# Patient Record
Sex: Female | Born: 1960 | Race: White | Hispanic: No | Marital: Married | Smoking: Former smoker
Health system: Southern US, Community
[De-identification: ages and names within clinical notes are randomized; demographics above are authoritative.]

## PROBLEM LIST (undated history)

## (undated) DIAGNOSIS — Z83719 Family history of colon polyps, unspecified: Secondary | ICD-10-CM

## (undated) DIAGNOSIS — Z8371 Family history of colonic polyps: Secondary | ICD-10-CM

## (undated) DIAGNOSIS — T8859XA Other complications of anesthesia, initial encounter: Secondary | ICD-10-CM

## (undated) DIAGNOSIS — T4145XA Adverse effect of unspecified anesthetic, initial encounter: Secondary | ICD-10-CM

## (undated) DIAGNOSIS — G629 Polyneuropathy, unspecified: Secondary | ICD-10-CM

## (undated) DIAGNOSIS — K589 Irritable bowel syndrome without diarrhea: Secondary | ICD-10-CM

## (undated) DIAGNOSIS — T7840XA Allergy, unspecified, initial encounter: Secondary | ICD-10-CM

## (undated) DIAGNOSIS — F32A Depression, unspecified: Secondary | ICD-10-CM

## (undated) DIAGNOSIS — F329 Major depressive disorder, single episode, unspecified: Secondary | ICD-10-CM

## (undated) DIAGNOSIS — F419 Anxiety disorder, unspecified: Secondary | ICD-10-CM

## (undated) DIAGNOSIS — D6800 Von Willebrand disease, unspecified: Secondary | ICD-10-CM

## (undated) DIAGNOSIS — G4700 Insomnia, unspecified: Secondary | ICD-10-CM

## (undated) DIAGNOSIS — Z9889 Other specified postprocedural states: Secondary | ICD-10-CM

## (undated) DIAGNOSIS — D1803 Hemangioma of intra-abdominal structures: Secondary | ICD-10-CM

## (undated) DIAGNOSIS — J302 Other seasonal allergic rhinitis: Secondary | ICD-10-CM

## (undated) DIAGNOSIS — I7300 Raynaud's syndrome without gangrene: Secondary | ICD-10-CM

## (undated) DIAGNOSIS — G4730 Sleep apnea, unspecified: Secondary | ICD-10-CM

## (undated) DIAGNOSIS — M1990 Unspecified osteoarthritis, unspecified site: Secondary | ICD-10-CM

## (undated) DIAGNOSIS — E785 Hyperlipidemia, unspecified: Secondary | ICD-10-CM

## (undated) DIAGNOSIS — D689 Coagulation defect, unspecified: Secondary | ICD-10-CM

## (undated) DIAGNOSIS — R112 Nausea with vomiting, unspecified: Secondary | ICD-10-CM

## (undated) DIAGNOSIS — N951 Menopausal and female climacteric states: Secondary | ICD-10-CM

## (undated) DIAGNOSIS — J310 Chronic rhinitis: Secondary | ICD-10-CM

## (undated) DIAGNOSIS — G43909 Migraine, unspecified, not intractable, without status migrainosus: Secondary | ICD-10-CM

## (undated) DIAGNOSIS — I499 Cardiac arrhythmia, unspecified: Secondary | ICD-10-CM

## (undated) DIAGNOSIS — M19079 Primary osteoarthritis, unspecified ankle and foot: Secondary | ICD-10-CM

## (undated) DIAGNOSIS — M549 Dorsalgia, unspecified: Secondary | ICD-10-CM

## (undated) HISTORY — DX: Sleep apnea, unspecified: G47.30

## (undated) HISTORY — DX: Depression, unspecified: F32.A

## (undated) HISTORY — DX: Raynaud's syndrome without gangrene: I73.00

## (undated) HISTORY — DX: Hyperlipidemia, unspecified: E78.5

## (undated) HISTORY — DX: Migraine, unspecified, not intractable, without status migrainosus: G43.909

## (undated) HISTORY — DX: Anxiety disorder, unspecified: F41.9

## (undated) HISTORY — PX: OTHER SURGICAL HISTORY: SHX169

## (undated) HISTORY — DX: Coagulation defect, unspecified: D68.9

## (undated) HISTORY — DX: Von Willebrand disease, unspecified: D68.00

## (undated) HISTORY — DX: Hemangioma of intra-abdominal structures: D18.03

## (undated) HISTORY — DX: Allergy, unspecified, initial encounter: T78.40XA

## (undated) HISTORY — DX: Chronic rhinitis: J31.0

## (undated) HISTORY — DX: Family history of colon polyps, unspecified: Z83.719

## (undated) HISTORY — DX: Insomnia, unspecified: G47.00

## (undated) HISTORY — DX: Irritable bowel syndrome, unspecified: K58.9

## (undated) HISTORY — DX: Menopausal and female climacteric states: N95.1

## (undated) HISTORY — DX: Unspecified osteoarthritis, unspecified site: M19.90

## (undated) HISTORY — PX: COLONOSCOPY: SHX174

## (undated) HISTORY — DX: Von Willebrand's disease: D68.0

## (undated) HISTORY — DX: Polyneuropathy, unspecified: G62.9

## (undated) HISTORY — DX: Family history of colonic polyps: Z83.71

## (undated) HISTORY — DX: Other seasonal allergic rhinitis: J30.2

## (undated) HISTORY — PX: SEPTOPLASTY: SUR1290

## (undated) HISTORY — DX: Dorsalgia, unspecified: M54.9

---
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
## 1999-12-30 ENCOUNTER — Other Ambulatory Visit: Admission: RE | Admit: 1999-12-30 | Discharge: 1999-12-30 | Payer: Self-pay | Admitting: Gynecology

## 2000-07-13 ENCOUNTER — Other Ambulatory Visit: Admission: RE | Admit: 2000-07-13 | Discharge: 2000-07-13 | Payer: Self-pay | Admitting: Gynecology

## 2000-08-07 ENCOUNTER — Encounter (INDEPENDENT_AMBULATORY_CARE_PROVIDER_SITE_OTHER): Payer: Self-pay | Admitting: *Deleted

## 2000-08-07 ENCOUNTER — Ambulatory Visit (HOSPITAL_COMMUNITY): Admission: RE | Admit: 2000-08-07 | Discharge: 2000-08-07 | Payer: Self-pay | Admitting: Gastroenterology

## 2000-08-27 ENCOUNTER — Ambulatory Visit (HOSPITAL_COMMUNITY): Admission: RE | Admit: 2000-08-27 | Discharge: 2000-08-27 | Payer: Self-pay | Admitting: Gastroenterology

## 2000-08-27 ENCOUNTER — Encounter: Payer: Self-pay | Admitting: Gastroenterology

## 2001-02-02 ENCOUNTER — Other Ambulatory Visit: Admission: RE | Admit: 2001-02-02 | Discharge: 2001-02-02 | Payer: Self-pay | Admitting: Gynecology

## 2001-04-30 ENCOUNTER — Inpatient Hospital Stay (HOSPITAL_COMMUNITY): Admission: AD | Admit: 2001-04-30 | Discharge: 2001-04-30 | Payer: Self-pay | Admitting: *Deleted

## 2001-05-01 ENCOUNTER — Encounter: Payer: Self-pay | Admitting: *Deleted

## 2001-05-01 ENCOUNTER — Inpatient Hospital Stay (HOSPITAL_COMMUNITY): Admission: AD | Admit: 2001-05-01 | Discharge: 2001-05-01 | Payer: Self-pay | Admitting: *Deleted

## 2001-06-20 ENCOUNTER — Inpatient Hospital Stay (HOSPITAL_COMMUNITY): Admission: AD | Admit: 2001-06-20 | Discharge: 2001-06-27 | Payer: Self-pay | Admitting: Gynecology

## 2001-06-21 ENCOUNTER — Encounter: Payer: Self-pay | Admitting: Gynecology

## 2001-06-23 ENCOUNTER — Encounter: Payer: Self-pay | Admitting: Gynecology

## 2001-07-04 ENCOUNTER — Inpatient Hospital Stay (HOSPITAL_COMMUNITY): Admission: AD | Admit: 2001-07-04 | Discharge: 2001-07-07 | Payer: Self-pay | Admitting: Gynecology

## 2001-07-04 ENCOUNTER — Encounter: Payer: Self-pay | Admitting: Gynecology

## 2001-07-04 ENCOUNTER — Encounter (INDEPENDENT_AMBULATORY_CARE_PROVIDER_SITE_OTHER): Payer: Self-pay | Admitting: Specialist

## 2001-07-08 ENCOUNTER — Encounter: Admission: RE | Admit: 2001-07-08 | Discharge: 2001-08-07 | Payer: Self-pay | Admitting: Gynecology

## 2001-07-29 ENCOUNTER — Other Ambulatory Visit: Admission: RE | Admit: 2001-07-29 | Discharge: 2001-07-29 | Payer: Self-pay | Admitting: Gynecology

## 2003-06-22 ENCOUNTER — Other Ambulatory Visit: Admission: RE | Admit: 2003-06-22 | Discharge: 2003-06-22 | Payer: Self-pay | Admitting: Gynecology

## 2004-07-17 ENCOUNTER — Other Ambulatory Visit: Admission: RE | Admit: 2004-07-17 | Discharge: 2004-07-17 | Payer: Self-pay | Admitting: Gynecology

## 2005-01-03 ENCOUNTER — Encounter: Admission: RE | Admit: 2005-01-03 | Discharge: 2005-01-03 | Payer: Self-pay | Admitting: Internal Medicine

## 2005-12-01 ENCOUNTER — Ambulatory Visit (HOSPITAL_COMMUNITY): Admission: RE | Admit: 2005-12-01 | Discharge: 2005-12-01 | Payer: Self-pay | Admitting: Endocrinology

## 2005-12-02 ENCOUNTER — Other Ambulatory Visit: Admission: RE | Admit: 2005-12-02 | Discharge: 2005-12-02 | Payer: Self-pay | Admitting: Internal Medicine

## 2005-12-11 HISTORY — PX: NOVASURE ABLATION: SHX5394

## 2005-12-30 ENCOUNTER — Ambulatory Visit (HOSPITAL_COMMUNITY): Admission: RE | Admit: 2005-12-30 | Discharge: 2005-12-30 | Payer: Self-pay | Admitting: Gynecology

## 2005-12-30 ENCOUNTER — Encounter (INDEPENDENT_AMBULATORY_CARE_PROVIDER_SITE_OTHER): Payer: Self-pay | Admitting: *Deleted

## 2007-05-11 ENCOUNTER — Other Ambulatory Visit: Admission: RE | Admit: 2007-05-11 | Discharge: 2007-05-11 | Payer: Self-pay | Admitting: Gynecology

## 2008-06-13 ENCOUNTER — Other Ambulatory Visit: Admission: RE | Admit: 2008-06-13 | Discharge: 2008-06-13 | Payer: Self-pay | Admitting: Family Medicine

## 2010-05-06 ENCOUNTER — Ambulatory Visit: Payer: Self-pay | Admitting: Gynecology

## 2010-07-15 ENCOUNTER — Ambulatory Visit: Payer: Self-pay | Admitting: Gynecology

## 2010-09-09 ENCOUNTER — Ambulatory Visit: Payer: Self-pay | Admitting: Gynecology

## 2010-10-24 ENCOUNTER — Ambulatory Visit
Admission: RE | Admit: 2010-10-24 | Discharge: 2010-10-24 | Payer: Self-pay | Source: Home / Self Care | Attending: Gynecology | Admitting: Gynecology

## 2010-10-31 ENCOUNTER — Ambulatory Visit
Admission: RE | Admit: 2010-10-31 | Discharge: 2010-10-31 | Payer: Self-pay | Source: Home / Self Care | Attending: Gynecology | Admitting: Gynecology

## 2010-11-05 ENCOUNTER — Other Ambulatory Visit: Payer: Self-pay | Admitting: Gastroenterology

## 2010-11-13 ENCOUNTER — Encounter: Payer: Self-pay | Admitting: Gastroenterology

## 2011-02-28 NOTE — H&P (Signed)
[facility]  Patient:    [name], [name] Visit Number: [id number] MRN: [id number]          Service Type: OBS Location: 910D 9124 01 Attending Physician:  Tonye Royalty Dictated by:   Gaetano Hawthorne. Lily Peer, M.D. Admit Date:  06/20/2001                           History and Physical  CHIEF COMPLAINT:              1. Twin gestation at 29-6/[redacted] weeks gestation                                  (diamniotic-dichorionic).                               2. Premature contractions.                               3. History of von Willebrands disease.  HISTORY:                      Patient is a 50 year old gravida 1, para 0 with last menstrual period of November 26, 2000, estimated date of confinement September 01, 2001, currently 29-6/[redacted] weeks gestation.  Patient has been on terbutaline 5 mg p.o. q.6h., initially was p.r.n. and recently starting taking it more regular, every six hours, and complained of increased uterine activity, more than six to eight contractions in a hour, and felt some lower abdominal pressure and was instructed to come into [facility].  Right before coming to the hospital, instead of taking her terbutaline 6 hours apart, she took it at 4 hours and when she arrived, her contractions were not as frequent but she had had approximately 4 in a 20-minute period.  Her cervix is long, closed and posterior.  GBS culture and wet prep were obtained. Her prenatal history is significant for the fact that she has a history of von Willebrands disease for which the plans were to administer DDAVP before any vaginal delivery or cesarean section.  She has been at bedrest at home since [redacted] weeks gestation due to the fact that she has shortening of her cervix. Her last ultrasound two weeks ago had determined the cervical length measure was 1.8 cm and some funneling.  Due to the fact that she has advanced maternal age, she had also prenatally  undergone genetic amniocentesis with one of the twins being a female and the other twin female with normal chromosomal studies.  ALLERGIES:                    She is allergic to SULFA.  PAST MEDICAL HISTORY:         1. History of von Willebrands disease.                               2. 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  REVIEW OF SYSTEMS:  See [place] form.  PHYSICAL EXAMINATION:  GENERAL:                      Well-developed, well-nourished female.  HEENT:                        Unremarkable.  NECK:                         Supple.  Trachea midline.  No carotid bruit.  No thyromegaly.  LUNGS:                        Clear to auscultation, without rhonchi or wheezes.  HEART:                        Regular rate and rhythm.  No murmurs or gallops.  BREASTS:                      Exam not done, done during the first prenatal visit.  ABDOMEN:                      Gravid uterus.  Fetal heart tones x 2.  PELVIC:                       Cervix long, closed and posterior.  EXTREMITIES:                  DTRs 1+.  Negative clonus.  PRENATAL LABORATORY DATA:     O-positive blood type, negative antibody screen. Toxoplasmosis screen was negative.  VDRL was nonreactive.  Rubella with evidence of immunity.  Hepatitis B surface antigen and HIV were negative. Amniocentesis as noted above.  Patient with abnormal diabetes screen but a normal three-hour GTT.  ASSESSMENT:                   [age]-year-old gravida 1 para 0 at 29-6/[redacted] weeks gestation with diamniotic and dichorionic twin gestation was being treated as an outpatient with terbutaline and has appeared to have broken through the oral tocolytics and was admitted for magnesium sulfate intravenous tocolytic.  She will receive 4 g bolus followed by 2 g/hr.  Group beta streptococcus was obtained.  In the interim, patient will be placed on penicillin G 5-million units as a  loading dose, followed by 2.5-million units intravenously q.4h.  Also, dexamethasone will be administered 6 mg intramuscularly and to be repeated q.12h. for four doses to enhance fetal lung maturity in the event that she were to continue to break through with tocolytics and imminent premature delivery.  Also, due to the history of von Willebrands disease, will contact the laboratory so that DDAVP may be available, and with the pharmacy as well.  Will also plan on obtaining an ultrasound for concordant growth and presentations of the twins and routine admission labs with type and screen.  PLAN:                         As per assessment above.  All the above were discussed with the patient and husband and all questions were answered and will follow accordingly. Dictated by:   Gaetano Hawthorne Lily Peer, M.D. Attending Physician:  Tonye Royalty DD:  06/21/01 TD:  06/21/01 Job: [id number]

## 2011-02-28 NOTE — Procedures (Signed)
[place]  Patient:    [name], [name]                     MRN: [id number] Proc. Date: 08/07/00 Adm. Date:  [id number] Attending:  Charna Elizabeth CC:         Timothy P. Fontaine, M.D.   Procedure Report  DATE OF BIRTH:  December 25, 1960  REFERRING PHYSICIAN:  Nadyne Coombes. Fontaine, M.D.  PROCEDURE PERFORMED:  Colonoscopy with biopsies.  ENDOSCOPIST:  Anselmo Rod, M.D.  INSTRUMENT USED:  Olympus video colonoscope.  INDICATIONS FOR PROCEDURE:  Right lower quadrant abdominal pain with occasional blood and mucus in stool in a 50 year old white female rule out inflammatory bowel disease.  PREPROCEDURE PREPARATION:  Informed consent was procured from the patient. The patient was fasted for eight hours prior to the procedure and prepped with a bottle of magnesium citrate and a gallon of NuLytely the night prior to the procedure.  PREPROCEDURE PHYSICAL:  The patient had stable vital signs.  Neck supple. Chest clear to auscultation.  S1, S2 regular.  Abdomen soft with right lower quadrant tenderness on palpation with guarding. No rebound, no rigidity, no hepatosplenomegaly.  DESCRIPTION OF PROCEDURE:  The patient was placed in the left lateral decubitus position and sedated with 8 mg of Versed intravenously.  The patient opted not to get Demerol as she felt it made her nauseous.   The patient was maintained on low-flow oxygen and continuous cardiac monitoring.  Once she was adequately sedated, the Olympus video colonoscope was advanced from the rectum to the cecum without difficulty.  The entire colonic mucosa appeared healthy without erosions, masses, polyps, or diverticulosis. There was patchy ulceration seen in the terminal ileum that biopsied for pathology.  There were small internal hemorrhoids appreciated on retroflexion.  The patient tolerated the procedure well without complication.  IMPRESSION: 1. Small nonbleeding internal  hemorrhoids. 2. Normal-appearing colon up to cecum. 3. Patchy whitish ulcerations in terminal ileum, biopsied to rule out    inflammatory bowel disease.  RECOMMENDATIONS: 1. Await pathology results. 2. Avoid all nonsteroidals. 3. Small bowel follow-through to further evaluate the small intestines and    for inflammatory bowel disease. 4. Outpatient follow-up in the next two weeks. DD:  08/07/00 TD:  08/07/00 Job: [id number]

## 2011-02-28 NOTE — Discharge Summary (Signed)
[facility]  Patient:    [name], [name] Visit Number: [id number] MRN: [id number]          Service Type: OBS Location: 910A 9147 01 Attending Physician:  Douglass Rivers Dictated by:   Antony Contras, [facility] Admit Date:  07/04/2001 Discharge Date: 07/07/2001                             Discharge Summary  DISCHARGE DIAGNOSES:          1. Twin pregnancy, 31-5/7 weeks.                               2. Preterm labor.                               3. Failed tocolysis.  PROCEDURES:                   Primary low cervical transverse cesarean                               section with delivery of viable infants.  HISTORY OF PRESENT ILLNESS:   The patient is a 50 year old primigravida with an LMP of November 26, 2000, [facility] September 01, 2001. Pregnancy was complicated by twin gestation, diamniotic, dichorinic twins. The patient also has von Willebrands disease and does take DDAVP before surgical procedures. She is also advanced maternal age.  LABORATORIES:                 Blood type O positive, antibody screen negative, RPR, HBsAg, HIV nonreactive. Toxoplasmosis negative. Amniotic AFP normal. Amniocentesis normal.  HOSPITAL COURSE/TREATMENT:    The patient was admitted on July 04, 2001 with a history of light spotting. The patient was on the terbutaline pump at home and she had noticed some contractions. Cervix was a fingertip, 80%, -4 station. Patient was bolused on terbutaline with good results and then treated with magnesium sulfate. The cervix progressed to a pinpoint with a bulging lower uterine segment. At this point, it was decided she had failed tocolysis so she was taken to the OR where low cervical transverse cesarean section was performed by Dr. Farrel Gobble under epidural/spinal anesthesia. The patient delivered twin infants, female infant delivered footling breech, clear amniotic fluid. Birth weight was 3-13, Apgars 6 and 8, and female  infant, transverse lie, back up, vertex, clear amniotic fluid, Apgars 8 and 9, and birth weight was 3.8.  POSTPARTUM COURSE:            The patient remained afebrile and had no difficulty voiding and was able to be discharged on her third postoperative day.  CBC: Hematocrit 29.5, hemoglobin 10.2, WBCs 12.5, platelets 229,000.  DISPOSITION:                  The patient is to follow up in six weeks, continue prenatal vitamins and iron, and Motrin and Tylox for pain. Dictated by:   Antony Contras, [facility] Attending Physician:  Douglass Rivers DD:  07/30/01 TD:  08/03/01 Job: [id number]

## 2011-02-28 NOTE — H&P (Signed)
[name], [name]            ACCOUNT NO.:  [id number]   MEDICAL RECORD NO.:  [id number]          PATIENT TYPE:  AMB   LOCATION:  SDC                           FACILITY:  [facility]   PHYSICIAN:  Timothy P. Fontaine, M.D.DATE OF BIRTH:  12/28/1960   DATE OF ADMISSION:  12/30/2005  DATE OF DISCHARGE:                                HISTORY & PHYSICAL   CHIEF COMPLAINT:  Menorrhagia.   HISTORY OF PRESENT ILLNESS:  A 50 year old G1, P2 female with history of  increasing menorrhagia.  The patient does have a history of mild von  Willebrand's, for which she is on no active therapy, but does use DDAVT for  bleeding episodes.  Her menorrhagia evaluation includes a history of a  relatively normal hemoglobin, normal thyroid functions, ultrasound which is  normal, sonohystogram which is also negative.  The patient is having  heavier, more frequent menses, which are becoming socially unacceptable, and  she is admitted at this time for NovaSure endometrial ablation, D&C,  hysteroscopy.   PAST MEDICAL HISTORY:  1.  von Willebrand's.  2.  Anxiety.  3.  Irritable bowel syndrome.   CURRENT MEDICATIONS:  1.  Cymbalta.  2.  Zelnorm.   ALLERGIES:  SULFA.   PAST SURGICAL HISTORY:  1.  Cesarean section.  2.  Left knee arthroscopy.  3.  Laparoscopic ovarian cystectomy.  4.  Pilonidal cyst.  5.  Septoplasty.   REVIEW OF SYSTEMS:  Noncontributory.   FAMILY HISTORY:  Noncontributory.   SOCIAL HISTORY:  Noncontributory.   PHYSICAL EXAMINATION:  VITAL SIGNS:  Afebrile.  Vital signs are stable.  HEENT:  Normal.  LUNGS:  Clear.  CARDIAC:  Regular rate without rubs, murmurs, or gallops.  ABDOMEN:  Benign.  PELVIC:  External BUS, vagina normal.  Cervix normal.  Uterus normal size,  midline and mobile.  Adnexa without masses or tenderness.   ASSESSMENT:  A 50 year old with increasing menorrhagia, socially  unacceptable.  Thyroid normal.  Sonohystogram negative.  Vasectomy birth  control.  For  NovaSure endometrial ablation.  I reviewed with the patient  the procedure, the expected intraoperative, postoperative courses.  I  reviewed the instrumentation.  She understands it is machine dependent, and  if there is a malfunction in the machine that we may have to cancel the  ablation.  There are no guarantees as far as bleeding relief.  She  understands that her bleeding may persist, worsen or change following the  procedure, all of which she understands and accepts.  She understands that  she should never achieve pregnancy following the procedure.  She is using  her husband's vasectomy, but she must always use assured birth control.  The  acute risks of the procedure were reviewed with the patient to include the  risks of infection requiring prolonged antibiotics, the risks of hemorrhage  necessitating transfusion, and the risks of transfusion.  The risk of  uterine perforation, inadvertant injury to internal organs including bowel,  bladder, ureters, vessels and nerves necessitating major reparative  surgeries and future reparative surgeries including ostomy formation was all  discussed, understood and accepted.  The patient understands  that she should  continue with gynecologic care following the procedure, and should be  continued to be screened for cervical endometrial disease in the future.  The patient's questions were answered to her satisfaction, and she is ready  to proceed with surgery.      Timothy P. Fontaine, M.D.  Electronically Signed     TPF/MEDQ  D:  12/25/2005  T:  12/25/2005  Job:  [id number]

## 2011-02-28 NOTE — Op Note (Signed)
[name], [name]            ACCOUNT NO.:  [id number]   MEDICAL RECORD NO.:  [id number]          PATIENT TYPE:  AMB   LOCATION:  SDC                           FACILITY:  [facility]   PHYSICIAN:  Timothy P. Fontaine, M.D.DATE OF BIRTH:  04/01/1961   DATE OF PROCEDURE:  12/30/2005  DATE OF DISCHARGE:                                 OPERATIVE REPORT   PREOPERATIVE DIAGNOSES:  Menorrhagia.   POSTOPERATIVE DIAGNOSES:  Menorrhagia.   PROCEDURE:  Hysteroscopy D&C, NovaSure endometrial ablation.   SURGEON:  Dr. Audie Box.   ANESTHETIC:  MAC with 1% lidocaine paracervical block.   ESTIMATED BLOOD LOSS:  Minimal.   SORBITOL DISCREPANCY:  85 mL.   COMPLICATIONS:  None.   SPECIMEN:  Endometrial curetting.   FINDINGS:  EUA, external BUS and vagina normal. Cervix is normal. Uterus  normal size, midline and mobile, nontender. Adnexa without masses or  tenderness. Hysteroscopic post ablation shows a good even treatment with  cornual coverage. Cavity is normal with good distension, no evidence of  perforation.   PROCEDURE:  The patient was taken to the operating room, underwent IV  sedation and was placed low dorsal lithotomy position, received perineal  vaginal preparation with Betadine solution. Bladder emptied with in-and-out  Foley catheterization in sterile technique and the EUA performed. The  patient was draped in usual fashion. Cervix visualized with a speculum.  Anterior lip grasped with single-tooth tenaculum and a paracervical block  using 1% lidocaine was placed. A total of 10 mL. Cervix was gently gradually  dilated to admit the smallest sharp curette and a gentle sharp curettage was  performed with scant return. Subsequently the endocervical length and  uterine length were calculated with a sound of 8 cm and a cervical length of  3 cm. The NovaSure ablator was placed within the cavity, according to  manufacturer's recommendations and manipulation assured proper placement.  The uterine cavity width was calculated at 3. Subsequently the carbon  dioxide testing sleeve was advanced and the carbon dioxide test performed  and passed. Subsequently the endometrial ablation was performed without  difficulty. The instrument was removed. Hysteroscopy was performed with a  normal-appearing cavity and an even post ablation  change. The instruments were all removed. Tenaculum sites inspected. Silver  nitrate applied to one site to achieve ultimate hemostasis. Subsequently  speculum removed. The patient placed in supine position and was taken to  recovery room in good condition having tolerated procedure well.      Timothy P. Fontaine, M.D.  Electronically Signed     TPF/MEDQ  D:  12/30/2005  T:  12/30/2005  Job:  [id number]

## 2011-02-28 NOTE — H&P (Signed)
NAMEHANNAHMARIE, [name]            ACCOUNT NO.:  [id number]   MEDICAL RECORD NO.:  [id number]          PATIENT TYPE:  AMB   LOCATION:  SDC                           [facility]ILITY:  [facility]   PHYSICIAN:  Timothy P. Fontaine, M.D.DATE OF BIRTH:  03/24/1961   DATE OF ADMISSION:  10/09/2005  DATE OF DISCHARGE:                                HISTORY & PHYSICAL   CHIEF COMPLAINT:  Menorrhagia.   HISTORY OF PRESENT ILLNESS:  A 50 year old G1, P2 female, vasectomy birth  control, with a history of increasing menorrhagia for endometrial ablation.  Outpatient evaluation includes a normal thyroid and a negative sonohystogram  for endometrial abnormalities. The patient does have a generous hemorrhagic-  appearing cyst on the right ovary measuring 46 mm, and the options and  issues with this were reviewed to include postponing the ablation for follow-  up ultrasound of the ovary, rule out possibility of laparoscopy in the  future or proceeding with laparoscopic evaluation now, and the patient  elects for conservative management with re-ultrasound in several months for  reevaluation. She wants to proceed with the ablation at this time.   PAST MEDICAL HISTORY:  von willebrands disease.   PAST SURGICAL HISTORY:  1.  Cesarean section.  2.  Laparoscopy with right ovarian cystectomy.  3.  Pilonidal cystectomy.  4.  Left knee arthroscopy.   CURRENT MEDICATIONS:  None.   ALLERGIES:  SULFA.   REVIEW OF SYSTEMS:  Noncontributory.   FAMILY HISTORY:  Noncontributory.   SOCIAL HISTORY:  Noncontributory.   PHYSICAL EXAMINATION:  VITAL SIGNS:  Afebrile. Vital signs stable.  HEENT: Normal.  LUNGS:  Clear.  CARDIAC:  Regular rate without rubs, murmurs or gallops.  ABDOMEN:  Benign.  PELVIC:  External BUS, vagina normal. Cervix grossly normal. Uterus normal  size, midline, mobile. Left adnexa normal. Right adnexa with mildly enlarged  palpable ovary.   ASSESSMENT/PLAN:  A 50 year old G1, P2 female,  vasectomy birth control, with  increasing menorrhagia.  Negative sonohystogram for endometrial  abnormalities. Normal thyroid in the past.  History of mild von  willebrands, for endometrial ablation. I reviewed with the patient what is  involved with endometrial ablation to include the expected intraoperative,  postoperative courses, and the technology involved with NovaSure. She  understands there are no guarantees as far as bleeding relief and she may  continue to have periods afterwards, and that her periods may be heavy  longer or worsen. She also understands that if there is a malfunction with  the equipment that we may not be able to achieve the ablation at this time.  She understands that she should never achieve a pregnancy following the  procedure.  They are using vasectomy for birth control, but she understands  if she has a different partner in the future that she should never achieve a  pregnancy, that this could be a very dangerous situation. I reviewed the  risks of bleeding, transfusion, infection, uterine perforation, damage to  internal organs including bowel, bladder, ureters, vessels and nerves  necessitating major exploratory reparative surgeries and future reparative  surgeries, all of which were understood and accepted.  The continued need for  gynecologic follow-up following the procedure assuming amenorrhea was  discussed both from a Pap smear, as well as endometrial evaluation  surveillance were reviewed, and the patient understands this. The patient's  questions were answered to her satisfaction. She is ready to proceed with  surgery.      Timothy P. Fontaine, M.D.  Electronically Signed     TPF/MEDQ  D:  09/22/2005  T:  09/22/2005  Job:  [id number]

## 2011-02-28 NOTE — Op Note (Signed)
[facility]  Patient:    [name], [name] Visit Number: [id number] MRN: [id number]          Service Type: OBS Location: 910D 9124 01 Attending Physician:  Douglass Rivers Proc. Date: 07/04/01 Admit Date:  07/04/2001                             Operative Report  PREOPERATIVE DIAGNOSES:         1. Twins at 31-5/7 weeks.                                 2. Preterm labor.                                 3. Failed tocolysis.  POSTOPERATIVE DIAGNOSES:        1. Twins at 31-5/7 weeks.                                 2. Preterm labor.                                 3. Failed tocolysis.  OPERATION:                      Primary cesarean section low flap transverse.  SURGEON:                        Douglass Rivers, M.D.  ANESTHESIA:                     Epidural spinal.  ESTIMATED BLOOD LOSS:           600 cc.  FINDINGS:                       Viable female infant delivered footling breech, clear amniotic fluid.  Birth weight 313, Apgars 6 and 8.  Viable female second twin.  Transverse lie back up.  Delivered vertex.  Clear amniotic fluid. Apgars were 8 and 9.  Birth weight was 3.8.  PATHOLOGY:                      Placentas.  COMPLICATIONS:                  The patient was taken to the operating room and placed in the supine position left lateral displacement after anesthesia was induced.  After adequate anesthesia was ensured, a Pfannenstiel skin incision was made with the scalpel and carried to the underlying layer of fascia with electrocautery.  The fascia was scored in the midline.  The incision was extended laterally with Mayo scissors.  The inferior aspect of the fascial incision was grasped with Kochers.  The underlying rectus muscles were dissected off by blunt and sharp dissection.  In a similar fashion, the superior aspect of the incision was grasped with Kochers and the underlying rectus muscles were separated off.  The rectus muscles were  separated in the midline.  The peritoneum was identified and entered sharply.  The peritoneal incision was then extended superiorly and inferiorly with good visualization of underlying bowel and  bladder.  The orientation of the uterus was confirmed. The vesicouterine peritoneum was identified.  The bladder blade was inserted, tented up and entered sharply with the Metzenbaum scissors.  The incision was extended laterally.  The bladder flap was created digitally.  The bladder blade was then reinserted in the lower uterine segment and incised in a transverse fashion with the scalpel.  Clear amniotic fluid was noted upon entering the cavity.  The infant was delivered from the footling breech presentation with the usual maneuvers.  The cord was clamped and cut and handed off to the awaiting pediatricians.  The sac on the second twin was visualized.  Her orientation was confirmed.  Her head was gently brought through to the incision and again amniotomy was performed.  Again, clear amniotic fluid was noted.  She was then delivered from the vertex presentation.  Cord was cut and clamped and again handed off to the awaiting pediatricians.  Spontaneous respirations were noted from both twins.  Cord bloods were obtained from each respective umbilical cord.  The placentas were allowed to separate naturally.  The uterus was then cleared of all clots and debris.  The uterine incision was then repaired with a running locked layer of 0 chromic and noted to be hemostatic.  The pelvis was irrigated with a copious amount of warm saline.  The adnexa were inspected and noted to be unremarkable.  The inspection of the peritoneal muscles and fascia was also unremarkable.  The fascia was then closed with 0 Vicryl.  The subcu was irrigated and the skin was closed with staples.  The patient tolerated the procedure well.  Sponge, lap and needle counts were correct x 2.  She was given cefotetan prior to surgery  secondary to the preterm labor status and was transferred to the PACU in stable condition. Attending Physician:  Douglass Rivers DD:  07/04/01 TD:  07/04/01 Job: [id number]

## 2011-02-28 NOTE — Discharge Summary (Signed)
[facility]  Patient:    [name], [name] Visit Number: [id number] MRN: [id number]          Service Type: OBS Location: [id number] Attending Physician:  Douglass Rivers Dictated by:   Antony Contras, [facility] Admit Date:  07/04/2001 Discharge Date: 07/07/2001                             Discharge Summary  DISCHARGE DIAGNOSES:          1. Intrauterine pregnancy at 29+ weeks.                               2. Premature contractions.                               3. History of von Willebrands disease.  PROCEDURES:                   Tocolysis.  HISTORY OF PRESENT ILLNESS:   The patient is a 50 year old primigravida with an LMP of November 26, 2000, [facility] September 01, 2001. Prenatal risk factors include a history of von Willebrands disease, twin pregnancy, advanced maternal age.  LABORATORY DATA:              Blood type O positive, antibody screen negative, RPR, HBsAg, HIV nonreactive. Toxoplasmosis negative. Amniotic AFP and amniocentesis normal.  HOSPITAL COURSE/TREATMENT:    The patient was admitted on June 21, 2001 due to increased uterine activity. She had been at bed rest at home since [redacted] weeks gestation due to a shortened cervix. Ultrasound two weeks prior to admission determined cervical length was 1.8 with some funneling. She had been taking oral terbutaline 5 mg p.o. q.6h. at home. She was admitted for magnesium sulfate therapy. During the course of her hospital admission, she was able to be stabilized on magnesium sulfate. Procardia was tried but did not seem to control the preterm symptoms. She was eventually, on September 12, weaned from the magnesium to a terbutaline pump and then was able to be totally weaned from the magnesium the next day and was able to be discharged in satisfactory condition to home on June 27, 2001.  DISPOSITION:                  The patient was to continue on bed rest and to follow up in the office on  June 30, 2001. Dictated by:   Antony Contras, [facility] Attending Physician:  Douglass Rivers DD:  07/30/01 TD:  08/03/01 Job: [id number]

## 2011-05-07 ENCOUNTER — Other Ambulatory Visit: Payer: Self-pay | Admitting: *Deleted

## 2011-05-07 DIAGNOSIS — Z309 Encounter for contraceptive management, unspecified: Secondary | ICD-10-CM

## 2011-05-07 MED ORDER — NORGESTIMATE-ETH ESTRADIOL 0.25-35 MG-MCG PO TABS: 1.0000 | ORAL_TABLET | Freq: Every day | ORAL | Status: DC

## 2011-05-07 NOTE — Telephone Encounter (Signed)
PT INFORMED OF BCP REFILL, SENT TO [facility].

## 2011-05-16 ENCOUNTER — Encounter: Payer: Self-pay | Admitting: *Deleted

## 2011-05-20 ENCOUNTER — Ambulatory Visit (INDEPENDENT_AMBULATORY_CARE_PROVIDER_SITE_OTHER): Payer: BC Managed Care – PPO | Admitting: Gynecology

## 2011-05-20 ENCOUNTER — Other Ambulatory Visit (HOSPITAL_COMMUNITY)
Admission: RE | Admit: 2011-05-20 | Discharge: 2011-05-20 | Disposition: A | Discharge status: Home / Self Care | Payer: BC Managed Care – PPO | Source: Ambulatory Visit | Attending: Gynecology | Admitting: Gynecology

## 2011-05-20 ENCOUNTER — Encounter: Payer: Self-pay | Admitting: *Deleted

## 2011-05-20 ENCOUNTER — Encounter: Payer: Self-pay | Admitting: Gynecology

## 2011-05-20 VITALS — BP 130/70 | Ht 68.0 in | Wt 170.0 lb

## 2011-05-20 DIAGNOSIS — G4700 Insomnia, unspecified: Secondary | ICD-10-CM

## 2011-05-20 DIAGNOSIS — Z01419 Encounter for gynecological examination (general) (routine) without abnormal findings: Secondary | ICD-10-CM

## 2011-05-20 DIAGNOSIS — Z309 Encounter for contraceptive management, unspecified: Secondary | ICD-10-CM

## 2011-05-20 DIAGNOSIS — Z131 Encounter for screening for diabetes mellitus: Secondary | ICD-10-CM

## 2011-05-20 DIAGNOSIS — Z13220 Encounter for screening for lipoid disorders: Secondary | ICD-10-CM

## 2011-05-20 MED ORDER — VENLAFAXINE HCL ER 75 MG PO CP24: 75.0000 mg | ORAL_CAPSULE | Freq: Every day | ORAL | Status: DC

## 2011-05-20 MED ORDER — NORGESTIMATE-ETH ESTRADIOL 0.25-35 MG-MCG PO TABS: 1.0000 | ORAL_TABLET | Freq: Every day | ORAL | Status: DC

## 2011-05-20 MED ORDER — ZOLPIDEM TARTRATE 10 MG PO TABS: 10.0000 mg | ORAL_TABLET | Freq: Every evening | ORAL | Status: DC | PRN

## 2011-05-20 NOTE — Progress Notes (Signed)
RX FOR AMBIEN 10MG  TAB #60  5 REFILLS CALL IN TO [facility] .

## 2011-05-20 NOTE — Progress Notes (Signed)
Addended by: Richardson Chiquito on: 05/20/2011 04:04 PM   Modules accepted: Orders

## 2011-05-20 NOTE — Progress Notes (Signed)
[name] 03/28/1961 [id number]        50 y.o.  for annual exam.  History of dysfunctional bleeding status post NovaSure endometrial ablation doing well on every 3 months oral contraceptive withdrawal with regular light menses. History of anxiety on Effexor her and doing very well with this. She does use Ambien for sleep at night due to chronic insomnia.  Past medical history,surgical history, allergies, family history and social history were all reviewed and documented in the EPIC chart. ROS:  Was performed and pertinent positives and negatives are included in the history.  Exam: chaperone present Filed Vitals:   05/20/11 1045  BP: 130/70   General appearance  Normal Skin grossly normal Head/Neck normal with no cervical or supraclavicular adenopathy thyroid normal Lungs  clear Cardiac RR, without RMG Abdominal  soft, nontender, without masses, organomegaly or hernia Breasts  examined lying and sitting without masses, retractions, discharge or axillary adenopathy. Pelvic  Ext/BUS/vagina  normal    Cervix  normal  Pap done  Uterus  anteverted, normal size, shape and contour, midline and mobile nontender   Adnexa  Without masses or tenderness    Anus and perineum  normal   Rectovaginal  normal sphincter tone without palpated masses or tenderness.     Assessment/Plan:  50 y.o. female for annual exam.   #1 History of DU B. On BCPs with 3 months which are all doing well. We again reviewed the risks to include stroke heart attack DVT. She does not smoke she is active is not being followed for any vascular disease. She wants to continue at present I think is reasonable. I discussed at some point we'll start checking FSH during pill free week or just stop the pills altogether and see what her pattern is at that point. But at this point will be to continue and I refilled her pills x1 year.  #2 History of anxiety. Doing well on Effexor when I refilled her Effexor XR 75 mg times  year.  #3 History of insomnia. We discussed this and she is doing well on Ambien wants to continue and I refilled her times a year.  #4 Health maintenance. Self breast exams on a monthly basis discussed urged at [facility] February which was normal we'll continue with annual mammography. I ordered a baseline CBC glucose lipid profile and urinalysis.    Dara Lords MD, 11:23 AM 05/20/2011

## 2011-05-21 ENCOUNTER — Telehealth: Payer: Self-pay | Admitting: *Deleted

## 2011-05-21 NOTE — Telephone Encounter (Signed)
PT INFORMED WITH THE BELOW 

## 2011-05-21 NOTE — Telephone Encounter (Signed)
Message copied by Aura Camps on Wed May 21, 2011 12:53 PM ------      Message from: Dara Lords      Created: Tue May 20, 2011  4:06 PM       Give patient lipid profile results. Cholesterol mildly elevated although her HDL is great. Her triglycerides are mildly elevated which is not unusual in nonfasting specimen.

## 2011-06-13 ENCOUNTER — Telehealth: Payer: Self-pay | Admitting: *Deleted

## 2011-06-13 DIAGNOSIS — G4700 Insomnia, unspecified: Secondary | ICD-10-CM

## 2011-06-13 MED ORDER — ZOLPIDEM TARTRATE 10 MG PO TABS: 10.0000 mg | ORAL_TABLET | Freq: Every evening | ORAL | Status: DC | PRN

## 2011-06-13 NOTE — Telephone Encounter (Signed)
rx called in to [facility] outpatient pharmacy per request.

## 2011-06-13 NOTE — Telephone Encounter (Signed)
PHARMACY FAXED OVER A REQUEST FROM PATIENT WANTING 90 DAY SUPPLY FOR AMBIEN 10MG . PT LAST OFFICE VISIT ON 05/20/11 AND #60 WAS GIVEN. PLEASE ADVISE.

## 2011-06-13 NOTE — Telephone Encounter (Signed)
OK 

## 2011-11-07 ENCOUNTER — Ambulatory Visit (INDEPENDENT_AMBULATORY_CARE_PROVIDER_SITE_OTHER): Payer: BC Managed Care – PPO | Admitting: Gynecology

## 2011-11-07 ENCOUNTER — Encounter: Payer: Self-pay | Admitting: Gynecology

## 2011-11-07 ENCOUNTER — Ambulatory Visit: Payer: BC Managed Care – PPO | Admitting: Gynecology

## 2011-11-07 DIAGNOSIS — N938 Other specified abnormal uterine and vaginal bleeding: Secondary | ICD-10-CM

## 2011-11-07 DIAGNOSIS — N949 Unspecified condition associated with female genital organs and menstrual cycle: Secondary | ICD-10-CM

## 2011-11-07 NOTE — Progress Notes (Signed)
Patient presents on low-dose oral contraceptives doing an every three-month withdrawal with light menses reports this past cycle she bled 2 weeks in a row. She also is very emotional with mood swings which is unusual. She is on Effexor which had been working well for her. No pain or other symptoms.  Exam with Selena Batten chaperone present Abdomen soft nontender without masses guarding rebound  Pelvic external BUS vagina normal. Cervix normal. Uterus normal size midline mobile nontender. Adnexa without masses or tenderness.  Assessment and plan: DUB x1 cycle on low-dose oral contraceptives. She is not bleeding now. Continue with low-dose pills and if recurrent irregular bleeding will represent. She had an ultrasound last year which was normal. I discussed the risks again of low-dose pills to include stroke heart attack DVT. We discussed checking an FSH at some point during her pill free week and we'll do this sometime in the coming year.  Options for her emotional swings were reviewed. She is on Effexor and I offered Xanax when necessary but she declined and will monitor present.

## 2011-11-07 NOTE — Patient Instructions (Signed)
Keep menstrual calendar. Call if irregular bleeding continues or emotional swings persist.

## 2011-12-09 ENCOUNTER — Other Ambulatory Visit: Payer: Self-pay

## 2011-12-22 ENCOUNTER — Other Ambulatory Visit: Payer: Self-pay | Admitting: Gynecology

## 2011-12-22 NOTE — Telephone Encounter (Signed)
Pt calling requesting 90 day supply for her ambein 10 mg. Please advise

## 2011-12-23 NOTE — Telephone Encounter (Signed)
rx called in to [facility] pharmacy.

## 2012-02-26 ENCOUNTER — Other Ambulatory Visit: Payer: Self-pay | Admitting: Internal Medicine

## 2012-02-26 ENCOUNTER — Other Ambulatory Visit (HOSPITAL_COMMUNITY): Payer: Self-pay | Admitting: Specialist

## 2012-02-26 DIAGNOSIS — M542 Cervicalgia: Secondary | ICD-10-CM

## 2012-03-01 ENCOUNTER — Inpatient Hospital Stay (HOSPITAL_COMMUNITY)
Admission: RE | Admit: 2012-03-01 | Discharge: 2012-03-01 | Payer: BC Managed Care – PPO | Source: Ambulatory Visit | Attending: Specialist | Admitting: Specialist

## 2012-04-19 ENCOUNTER — Other Ambulatory Visit: Payer: Self-pay | Admitting: Gynecology

## 2012-05-27 ENCOUNTER — Encounter: Payer: Self-pay | Admitting: Gynecology

## 2012-05-27 ENCOUNTER — Ambulatory Visit (INDEPENDENT_AMBULATORY_CARE_PROVIDER_SITE_OTHER): Payer: BC Managed Care – PPO | Admitting: Gynecology

## 2012-05-27 VITALS — BP 124/70 | Ht 68.0 in | Wt 190.0 lb

## 2012-05-27 DIAGNOSIS — M542 Cervicalgia: Secondary | ICD-10-CM

## 2012-05-27 DIAGNOSIS — Z01419 Encounter for gynecological examination (general) (routine) without abnormal findings: Secondary | ICD-10-CM

## 2012-05-27 DIAGNOSIS — Z13220 Encounter for screening for lipoid disorders: Secondary | ICD-10-CM

## 2012-05-27 DIAGNOSIS — Z131 Encounter for screening for diabetes mellitus: Secondary | ICD-10-CM

## 2012-05-27 DIAGNOSIS — N951 Menopausal and female climacteric states: Secondary | ICD-10-CM

## 2012-05-27 LAB — CBC WITH DIFFERENTIAL/PLATELET
Basophils Absolute: 0 10*3/uL (ref 0.0–0.1)
Basophils Relative: 1 % (ref 0–1)
Eosinophils Absolute: 0.3 10*3/uL (ref 0.0–0.7)
Eosinophils Relative: 5 % (ref 0–5)
HCT: 42.1 % (ref 36.0–46.0)
Hemoglobin: 14.3 g/dL (ref 12.0–15.0)
Lymphocytes Relative: 32 % (ref 12–46)
Lymphs Abs: 2.1 10*3/uL (ref 0.7–4.0)
MCH: 31 pg (ref 26.0–34.0)
MCHC: 34 g/dL (ref 30.0–36.0)
MCV: 91.1 fL (ref 78.0–100.0)
Monocytes Absolute: 0.6 10*3/uL (ref 0.1–1.0)
Monocytes Relative: 9 % (ref 3–12)
Neutro Abs: 3.6 10*3/uL (ref 1.7–7.7)
Neutrophils Relative %: 53 % (ref 43–77)
Platelets: 251 10*3/uL (ref 150–400)
RBC: 4.62 MIL/uL (ref 3.87–5.11)
RDW: 13.1 % (ref 11.5–15.5)
WBC: 6.6 10*3/uL (ref 4.0–10.5)

## 2012-05-27 LAB — LIPID PANEL
Cholesterol: 223 mg/dL — ABNORMAL HIGH (ref 0–200)
HDL: 71 mg/dL (ref >39)
LDL Cholesterol: 125 mg/dL — ABNORMAL HIGH (ref 0–99)
Total CHOL/HDL Ratio: 3.1 Ratio
Triglycerides: 133 mg/dL (ref <150)
VLDL: 27 mg/dL (ref 0–40)

## 2012-05-27 LAB — GLUCOSE, RANDOM: Glucose, Bld: 88 mg/dL (ref 70–99)

## 2012-05-27 LAB — FOLLICLE STIMULATING HORMONE: FSH: 56.4 m[IU]/mL

## 2012-05-27 MED ORDER — NORGESTIMATE-ETH ESTRADIOL 0.25-35 MG-MCG PO TABS: 1.0000 | ORAL_TABLET | Freq: Every day | ORAL | Status: DC

## 2012-05-27 MED ORDER — ZOLPIDEM TARTRATE 10 MG PO TABS: 10.0000 mg | ORAL_TABLET | Freq: Every evening | ORAL | Status: DC | PRN

## 2012-05-27 MED ORDER — CYCLOBENZAPRINE HCL 10 MG PO TABS: 10.0000 mg | ORAL_TABLET | Freq: Three times a day (TID) | ORAL | Status: AC | PRN

## 2012-05-27 MED ORDER — VENLAFAXINE HCL ER 75 MG PO CP24: 75.0000 mg | ORAL_CAPSULE | Freq: Every day | ORAL | Status: DC

## 2012-05-27 NOTE — Patient Instructions (Signed)
Office will contact you with lab work. Follow up in one year for annual gynecologic exam.

## 2012-05-27 NOTE — Progress Notes (Addendum)
[name] [birth year] [id number]        51 y.o.  G1P1 for annual exam.  Several issues noted below.  Past medical history,surgical history, medications, allergies, family history and social history were all reviewed and documented in the EPIC chart. ROS:  Was performed and pertinent positives and negatives are included in the history.  Exam: Sherrilyn Rist assistant Filed Vitals:   05/27/12 0901  BP: 124/70  Height: 5\' 8"  (1.727 m)  Weight: 190 lb (86.183 kg)   General appearance  Normal Skin grossly normal Head/Neck normal with no cervical or supraclavicular adenopathy thyroid normal. No evidence of cervical deformity or overt muscle spasm Lungs  clear Cardiac RR, without RMG Abdominal  soft, nontender, without masses, organomegaly or hernia Breasts  examined lying and sitting without masses, retractions, discharge or axillary adenopathy. Pelvic  Ext/BUS/vagina  normal   Cervix  normal   Uterus  anteverted, normal size, shape and contour, midline and mobile nontender   Adnexa  Without masses or tenderness    Anus and perineum  normal   Rectovaginal  normal sphincter tone without palpated masses or tenderness.    Assessment/Plan:  51 y.o. G1P1 female for annual exam, vasectomy birth control.   1. DUB. Patient status post ablation with history of some erratic spotting. She was placed on every three-month oral contraceptive withdrawal and is doing great with this. She is pill free now we'll check an FSH. Otherwise tentatively she will continue on her low-dose oral contraceptives this coming year and I refilled her times a year.  We've discussed the risks of pills to include stroke heart attack DVT which she understands and accepts. 2. Anxiety. Patient is on Effexor 75 mg XR. Doing well with this wants to continue and I refilled her times a year. 3. Insomnia. Patient has a history of chronic insomnia and is using Ambien. I refilled her 10 mg #60, 3 refills. 4. Neck muscle spasm. Patient  awoke with spasm in her right neck/trapezius area. Exam is negative. We'll treat with Flexeril 10 mg at bedtime #30 when necessary. If continues will refer to orthopedics. 5. Mammography. Patient is due now and knows to schedule. SBE monthly reviewed. 6. Pap smear. Pap 2012 normal. No Pap done today. No history of abnormal Pap smears in the past. We'll plan every 3-5 year screening per current screening guidelines. 7. Health maintenance. Baseline CBC lipid profile glucose urinalysis ordered along with her FSH. Follow up in one year, sooner as needed.    Dara Lords MD, 9:51 AM 05/27/2012

## 2012-05-28 LAB — URINALYSIS W MICROSCOPIC + REFLEX CULTURE
Bacteria, UA: NONE SEEN
Bilirubin Urine: NEGATIVE
Casts: NONE SEEN
Crystals: NONE SEEN
Glucose, UA: NEGATIVE mg/dL
Hgb urine dipstick: NEGATIVE
Ketones, ur: NEGATIVE mg/dL
Leukocytes, UA: NEGATIVE
Nitrite: NEGATIVE
Protein, ur: NEGATIVE mg/dL
Specific Gravity, Urine: 1.013 (ref 1.005–1.030)
Squamous Epithelial / LPF: NONE SEEN
Urobilinogen, UA: 0.2 mg/dL (ref 0.0–1.0)
pH: 6 (ref 5.0–8.0)

## 2012-06-21 ENCOUNTER — Encounter: Payer: Self-pay | Admitting: Gynecology

## 2012-11-23 ENCOUNTER — Ambulatory Visit (INDEPENDENT_AMBULATORY_CARE_PROVIDER_SITE_OTHER): Payer: BC Managed Care – PPO | Admitting: Gynecology

## 2012-11-23 ENCOUNTER — Encounter: Payer: Self-pay | Admitting: Gynecology

## 2012-11-23 DIAGNOSIS — N951 Menopausal and female climacteric states: Secondary | ICD-10-CM

## 2012-11-23 NOTE — Patient Instructions (Signed)
Start on the [employer] as we discussed. Call me if they have any issues. Follow up when due for your annual exam.

## 2012-11-23 NOTE — Progress Notes (Signed)
Patient presents to discuss her menopausal symptoms. She was on [employer] birth control pills for bleeding regulation and menopausal symptom control. Her pharmacy discontinue these and she stopped her pills last month has had unacceptable hot flushes sweats mood swings. We discussed options to include initiating HRT now noting her FSH was elevated last year. Or continuing on a low-dose oral contraceptive. I think given her good bleeding control and symptom relief on the low-dose oral contraceptives we'll continue these for the next 6 months until she is due for her annual and then consider switching her over to a low-dose HRT such as Activella or FemHRT. Patient agrees with the plan. I gave her 6 month sample of Generess BCPs we had in the office which is a 25 mcg pill.

## 2012-12-20 ENCOUNTER — Other Ambulatory Visit: Payer: Self-pay | Admitting: Gynecology

## 2012-12-21 NOTE — Telephone Encounter (Signed)
Called into pharmacy

## 2013-02-10 ENCOUNTER — Telehealth: Payer: Self-pay | Admitting: *Deleted

## 2013-02-10 NOTE — Telephone Encounter (Signed)
rx called into pharmacy

## 2013-02-10 NOTE — Telephone Encounter (Signed)
Okay to refill? 

## 2013-02-10 NOTE — Telephone Encounter (Signed)
Delice Bison from [facility] pharmacy called and pt is requesting a refill on Ambien 10 mg tablet, this is 3 days earlier then rx can be filled without your consent. Okay to fill? Annual due in august. Please advise

## 2013-03-29 ENCOUNTER — Telehealth: Payer: Self-pay | Admitting: *Deleted

## 2013-03-29 MED ORDER — NORETHIN-ETH ESTRADIOL-FE 0.8-25 MG-MCG PO CHEW: 1.0000 | CHEWABLE_TABLET | Freq: Every day | ORAL | Status: DC

## 2013-03-29 NOTE — Telephone Encounter (Signed)
Pt has been doing well on samples given on 11/23/12 with Generess, pt is requesting Rx. Pt lost 1 of her packs of the samples, her annual is due in August 2014. I will sent 2 pack enough for her to schedule thru August.

## 2013-04-18 ENCOUNTER — Other Ambulatory Visit: Payer: Self-pay | Admitting: Gynecology

## 2013-04-19 NOTE — Telephone Encounter (Signed)
rx called in [facility] 

## 2013-06-06 ENCOUNTER — Other Ambulatory Visit: Payer: Self-pay

## 2013-06-06 MED ORDER — NORETHIN-ETH ESTRADIOL-FE 0.8-25 MG-MCG PO CHEW: 1.0000 | CHEWABLE_TABLET | Freq: Every day | ORAL | Status: DC

## 2013-06-06 MED ORDER — NORETHIN-ETH ESTRADIOL-FE 0.8-25 MG-MCG PO CHEW: CHEWABLE_TABLET | ORAL | Status: DC

## 2013-06-06 NOTE — Telephone Encounter (Signed)
Patient has her yearly exam scheduled.

## 2013-06-06 NOTE — Telephone Encounter (Signed)
Pharmacy called back because patient is currently taking active oc's 3 mos and then placebo break. Rx needs to reflect that. Irefilled it as above.

## 2013-06-16 ENCOUNTER — Other Ambulatory Visit: Payer: Self-pay | Admitting: Gynecology

## 2013-06-17 ENCOUNTER — Encounter: Payer: Self-pay | Admitting: Gynecology

## 2013-06-28 ENCOUNTER — Ambulatory Visit (INDEPENDENT_AMBULATORY_CARE_PROVIDER_SITE_OTHER): Payer: BC Managed Care – PPO | Admitting: Gynecology

## 2013-06-28 ENCOUNTER — Encounter: Payer: Self-pay | Admitting: Gynecology

## 2013-06-28 VITALS — BP 120/70 | Ht 68.0 in | Wt 176.0 lb

## 2013-06-28 DIAGNOSIS — Z01419 Encounter for gynecological examination (general) (routine) without abnormal findings: Secondary | ICD-10-CM

## 2013-06-28 DIAGNOSIS — Z13220 Encounter for screening for lipoid disorders: Secondary | ICD-10-CM

## 2013-06-28 DIAGNOSIS — N9489 Other specified conditions associated with female genital organs and menstrual cycle: Secondary | ICD-10-CM

## 2013-06-28 DIAGNOSIS — N898 Other specified noninflammatory disorders of vagina: Secondary | ICD-10-CM

## 2013-06-28 LAB — COMPREHENSIVE METABOLIC PANEL
ALT: 24 U/L (ref 0–35)
AST: 22 U/L (ref 0–37)
Albumin: 4.1 g/dL (ref 3.5–5.2)
Alkaline Phosphatase: 44 U/L (ref 39–117)
BUN: 11 mg/dL (ref 6–23)
CO2: 26 mEq/L (ref 19–32)
Calcium: 9 mg/dL (ref 8.4–10.5)
Chloride: 101 mEq/L (ref 96–112)
Creat: 0.78 mg/dL (ref 0.50–1.10)
Glucose, Bld: 89 mg/dL (ref 70–99)
Potassium: 4.2 mEq/L (ref 3.5–5.3)
Sodium: 135 mEq/L (ref 135–145)
Total Bilirubin: 0.4 mg/dL (ref 0.3–1.2)
Total Protein: 6.7 g/dL (ref 6.0–8.3)

## 2013-06-28 LAB — CBC WITH DIFFERENTIAL/PLATELET
Basophils Absolute: 0 10*3/uL (ref 0.0–0.1)
Basophils Relative: 0 % (ref 0–1)
Eosinophils Absolute: 0.1 10*3/uL (ref 0.0–0.7)
Eosinophils Relative: 2 % (ref 0–5)
HCT: 40.2 % (ref 36.0–46.0)
Hemoglobin: 13.3 g/dL (ref 12.0–15.0)
Lymphocytes Relative: 25 % (ref 12–46)
Lymphs Abs: 1.7 10*3/uL (ref 0.7–4.0)
MCH: 31 pg (ref 26.0–34.0)
MCHC: 33.1 g/dL (ref 30.0–36.0)
MCV: 93.7 fL (ref 78.0–100.0)
Monocytes Absolute: 0.4 10*3/uL (ref 0.1–1.0)
Monocytes Relative: 6 % (ref 3–12)
Neutro Abs: 4.4 10*3/uL (ref 1.7–7.7)
Neutrophils Relative %: 67 % (ref 43–77)
Platelets: 214 10*3/uL (ref 150–400)
RBC: 4.29 MIL/uL (ref 3.87–5.11)
RDW: 13.1 % (ref 11.5–15.5)
WBC: 6.6 10*3/uL (ref 4.0–10.5)

## 2013-06-28 LAB — LIPID PANEL
Cholesterol: 195 mg/dL (ref 0–200)
HDL: 71 mg/dL (ref >39)
LDL Cholesterol: 97 mg/dL (ref 0–99)
Total CHOL/HDL Ratio: 2.7 Ratio
Triglycerides: 134 mg/dL (ref <150)
VLDL: 27 mg/dL (ref 0–40)

## 2013-06-28 LAB — RHEUMATOID FACTOR: Rhuematoid fact SerPl-aCnc: <10 IU/mL (ref <=14)

## 2013-06-28 LAB — TSH: TSH: 2.153 u[IU]/mL (ref 0.350–4.500)

## 2013-06-28 LAB — SEDIMENTATION RATE: Sed Rate: 5 mm/hr (ref 0–22)

## 2013-06-28 MED ORDER — VENLAFAXINE HCL ER 75 MG PO CP24: ORAL_CAPSULE | ORAL | Status: DC

## 2013-06-28 MED ORDER — ZOLPIDEM TARTRATE 10 MG PO TABS: ORAL_TABLET | ORAL | Status: DC

## 2013-06-28 MED ORDER — NORETHIN-ETH ESTRADIOL-FE 0.8-25 MG-MCG PO CHEW: CHEWABLE_TABLET | ORAL | Status: DC

## 2013-06-28 NOTE — Patient Instructions (Signed)
Call me in followup for response to the Vagifem. Followup in one year for annual exam.

## 2013-06-28 NOTE — Progress Notes (Signed)
[name] [birth year] [id number]        52 y.o.  G1P1 for annual exam.  Several issues noted below.  Past medical history,surgical history, medications, allergies, family history and social history were all reviewed and documented in the EPIC chart.  ROS:  Performed and pertinent positives and negatives are included in the history, assessment and plan .  Exam: Kim assistant Filed Vitals:   06/28/13 1052  BP: 120/70  Height: 5\' 8"  (1.727 m)  Weight: 176 lb (79.833 kg)   General appearance  Normal Skin grossly normal Head/Neck normal with no cervical or supraclavicular adenopathy thyroid normal Lungs  clear Cardiac RR, without RMG Abdominal  soft, nontender, without masses, organomegaly or hernia Breasts  examined lying and sitting without masses, retractions, discharge or axillary adenopathy. Pelvic  Ext/BUS/vagina  normal  Cervix  normal  Uterus  anteverted, normal size, shape and contour, midline and mobile nontender   Adnexa  Without masses or tenderness    Anus and perineum  normal   Rectovaginal  normal sphincter tone without palpated masses or tenderness.    Assessment/Plan:  52 y.o. G1P1 female for annual exam, vasectomy birth control.   1. Perimenopausal/vaginal dryness.  Patient doing well on the 25 mcg birth control pill from a hot flash night sweats standpoint. Doing every other month withdrawal with scant bleeding if any.  Does note vaginal dryness and requires lubricant with intercourse. Options for management include OTC products, increasing the dose of estrogen in her oral contraceptive versus adding vaginal estrogen discussed. Patient wants trial of vaginal estrogen and will trial Vagifem 10 mcg nightly x2 weeks then twice weekly. 4 sample cards given. Patient will call at the end of these cards in followup. Options to switch to a low dose HRT versus continuing the oral contraceptives discussed. Patient I both agree to continue oral contraceptives for now.  Risks of thrombosis to include stroke heart attack DVT all discussed and accepted. 2. Pap smear 2012. The Pap smear done today. No history of abnormal Pap smears previously. Plan repeat Pap smear next year 3 year interval. 3. Mammography 06/2013. Continued annual mammography. SBE monthly review. 4. Colonoscopy 12 years ago. Need to repeat screening now discussed and patient will schedule. 5. DEXA. We'll plan further into the menopause. Increase calcium vitamin D reviewed. 6. Anxiety/insomnia. Patient is doing well on Ambien nightly and Effexor 75 mg XR daily. Patient wants to continue and I refilled her times a year. 7. Health maintenance. Baseline CBC comprehensive metabolic panel lipid profile urinalysis TSH vitamin D ordered. Also ordered ANA lupus anticoagulant rheumatoid factor and sedimentation rate due to strong family history of autoimmune disorders and patient's complaints of dry eye and vaginal dryness. Followup in response to Vagifem otherwise 1 year.  Note: This document was prepared with digital dictation and possible smart phrase technology. Any transcriptional errors that result from this process are unintentional.   Dara Lords MD, 11:33 AM 06/28/2013

## 2013-06-29 LAB — URINALYSIS W MICROSCOPIC + REFLEX CULTURE
Bacteria, UA: NONE SEEN
Bilirubin Urine: NEGATIVE
Casts: NONE SEEN
Crystals: NONE SEEN
Glucose, UA: NEGATIVE mg/dL
Hgb urine dipstick: NEGATIVE
Ketones, ur: NEGATIVE mg/dL
Leukocytes, UA: NEGATIVE
Nitrite: NEGATIVE
Protein, ur: NEGATIVE mg/dL
Specific Gravity, Urine: 1.01 (ref 1.005–1.030)
Squamous Epithelial / LPF: NONE SEEN
Urobilinogen, UA: 0.2 mg/dL (ref 0.0–1.0)
pH: 7 (ref 5.0–8.0)

## 2013-06-29 LAB — LUPUS ANTICOAGULANT PANEL
DRVVT: 27 secs (ref <42.9)
Lupus Anticoagulant: NOT DETECTED
PTT Lupus Anticoagulant: 30.9 secs (ref 28.0–43.0)

## 2013-06-29 LAB — VITAMIN D 25 HYDROXY (VIT D DEFICIENCY, FRACTURES): Vit D, 25-Hydroxy: 61 ng/mL (ref 30–89)

## 2013-06-29 LAB — ANA: Anti Nuclear Antibody(ANA): NEGATIVE

## 2013-07-06 ENCOUNTER — Other Ambulatory Visit: Payer: Self-pay | Admitting: *Deleted

## 2013-07-06 NOTE — Telephone Encounter (Signed)
Pt requesting a 3 month supply

## 2013-07-07 MED ORDER — ZOLPIDEM TARTRATE 10 MG PO TABS: ORAL_TABLET | ORAL | Status: DC

## 2013-07-11 NOTE — Telephone Encounter (Signed)
rx called in [facility] 

## 2013-07-12 ENCOUNTER — Telehealth: Payer: Self-pay

## 2013-07-13 ENCOUNTER — Telehealth: Payer: Self-pay

## 2013-07-13 MED ORDER — ZOLPIDEM TARTRATE 10 MG PO TABS: ORAL_TABLET | ORAL | Status: DC

## 2013-07-13 NOTE — Telephone Encounter (Signed)
Pharmacy contacted [place] with refill request for generic Ambien and asking for a 90 day supply.  Dr. Velvet Bathe said [facility] for #90 with one refill. Pharmacy notified. Correction to previous refill several days ago made.

## 2013-08-18 ENCOUNTER — Other Ambulatory Visit: Payer: Self-pay

## 2013-08-29 ENCOUNTER — Ambulatory Visit: Payer: BC Managed Care – PPO | Admitting: Gynecology

## 2013-08-30 ENCOUNTER — Encounter: Payer: Self-pay | Admitting: Gynecology

## 2013-08-30 ENCOUNTER — Ambulatory Visit (INDEPENDENT_AMBULATORY_CARE_PROVIDER_SITE_OTHER): Payer: BC Managed Care – PPO | Admitting: Gynecology

## 2013-08-30 DIAGNOSIS — N760 Acute vaginitis: Secondary | ICD-10-CM

## 2013-08-30 LAB — WET PREP FOR TRICH, YEAST, CLUE
Clue Cells Wet Prep HPF POC: NONE SEEN
Trich, Wet Prep: NONE SEEN
WBC, Wet Prep HPF POC: NONE SEEN
Yeast Wet Prep HPF POC: NONE SEEN

## 2013-08-30 MED ORDER — NYSTATIN-TRIAMCINOLONE 100000-0.1 UNIT/GM-% EX OINT: 1.0000 "application " | TOPICAL_OINTMENT | Freq: Two times a day (BID) | CUTANEOUS | Status: DC

## 2013-08-30 MED ORDER — FLUCONAZOLE 200 MG PO TABS: 200.0000 mg | ORAL_TABLET | Freq: Every day | ORAL | Status: DC

## 2013-08-30 MED ORDER — ESTRADIOL 10 MCG VA TABS: 1.0000 | ORAL_TABLET | VAGINAL | Status: DC

## 2013-08-30 NOTE — Addendum Note (Signed)
Addended by: Dayna Barker on: 08/30/2013 03:09 PM   Modules accepted: Orders

## 2013-08-30 NOTE — Progress Notes (Signed)
The patient presents complaining of approximately 4-6 weeks of a cracking area on the vulva that keeps coming and going. Seems that it can heal but then recracks. Also notes tongue burning sensation. No lesions or ulcerations. Is at the end of her Vagifem samples and notes that she did have a nice response with no issues with vaginal dryness dyspareunia. Continues on a low dose oral contraceptives. She does note a history of migraines with auras.  Exam with Berenice Bouton Mouth without evidence of exudate, ulcerations or evidence of thrush. Neck supple no adenopathy External BUS vagina with hairline cracking of the skin above clitoral hood lower part of the mons pubis. No ulcerations, adenopathy. Vagina with slight white discharge. Cervix normal. Uterus normal size midline mobile nontender. Adnexa without masses or tenderness.  Assessment and plan: 1. Cracking lower mons pubis questionable fungal in origin. Wet prep was negative. Treat with Mytrex cream twice a day. Diflucan 200 daily x10 to address #2 also. Followup if symptoms persist. 2. Burning tongue sensation. We'll treat as possible yeast with Diflucan 200 mg daily x10 days. Followup if symptoms persist. 3. Atrophic vaginitis responding well to Vagifem. Refill for Vagifem provided to continue twice weekly. 4. History of migraines occasionally. Reviewed increased risk of stroke associated with oral contraceptives and migraines. Patient aware and accepts the risks.

## 2013-08-30 NOTE — Patient Instructions (Signed)
Take Diflucan pill daily for 10 days. Apply Mytrex cream to the affected area twice daily Followup if symptoms persist worsen or recur.

## 2013-08-31 ENCOUNTER — Other Ambulatory Visit: Payer: Self-pay

## 2013-08-31 MED ORDER — ESTRADIOL 10 MCG VA TABS: 1.0000 | ORAL_TABLET | VAGINAL | Status: DC

## 2013-08-31 NOTE — Telephone Encounter (Signed)
Pharmacy requesting 90 day supply for patients Rx.

## 2013-09-07 ENCOUNTER — Encounter (HOSPITAL_COMMUNITY): Payer: Self-pay | Admitting: Radiology

## 2013-09-07 ENCOUNTER — Emergency Department (HOSPITAL_COMMUNITY): Payer: BC Managed Care – PPO

## 2013-09-07 ENCOUNTER — Emergency Department (HOSPITAL_COMMUNITY)
Admission: EM | Admit: 2013-09-07 | Discharge: 2013-09-07 | Disposition: A | Discharge status: Home / Self Care | Payer: BC Managed Care – PPO | Attending: Emergency Medicine | Admitting: Emergency Medicine

## 2013-09-07 DIAGNOSIS — F411 Generalized anxiety disorder: Secondary | ICD-10-CM | POA: Insufficient documentation

## 2013-09-07 DIAGNOSIS — R0789 Other chest pain: Secondary | ICD-10-CM | POA: Insufficient documentation

## 2013-09-07 DIAGNOSIS — D6800 Von Willebrand disease, unspecified: Secondary | ICD-10-CM | POA: Insufficient documentation

## 2013-09-07 DIAGNOSIS — Z87891 Personal history of nicotine dependence: Secondary | ICD-10-CM | POA: Insufficient documentation

## 2013-09-07 DIAGNOSIS — Z79899 Other long term (current) drug therapy: Secondary | ICD-10-CM | POA: Insufficient documentation

## 2013-09-07 DIAGNOSIS — R000 Tachycardia, unspecified: Secondary | ICD-10-CM | POA: Insufficient documentation

## 2013-09-07 DIAGNOSIS — K589 Irritable bowel syndrome without diarrhea: Secondary | ICD-10-CM | POA: Insufficient documentation

## 2013-09-07 LAB — COMPREHENSIVE METABOLIC PANEL
ALT: 32 U/L (ref 0–35)
AST: 25 U/L (ref 0–37)
Albumin: 3.8 g/dL (ref 3.5–5.2)
Alkaline Phosphatase: 59 U/L (ref 39–117)
BUN: 10 mg/dL (ref 6–23)
CO2: 24 mEq/L (ref 19–32)
Calcium: 9.5 mg/dL (ref 8.4–10.5)
Chloride: 99 mEq/L (ref 96–112)
Creatinine, Ser: 0.82 mg/dL (ref 0.50–1.10)
GFR calc Af Amer: >90 mL/min (ref >90)
GFR calc non Af Amer: 81 mL/min — ABNORMAL LOW (ref >90)
Glucose, Bld: 100 mg/dL — ABNORMAL HIGH (ref 70–99)
Potassium: 3.8 mEq/L (ref 3.5–5.1)
Sodium: 135 mEq/L (ref 135–145)
Total Bilirubin: 0.5 mg/dL (ref 0.3–1.2)
Total Protein: 7.1 g/dL (ref 6.0–8.3)

## 2013-09-07 LAB — CBC WITH DIFFERENTIAL/PLATELET
Basophils Absolute: 0 10*3/uL (ref 0.0–0.1)
Basophils Relative: 0 % (ref 0–1)
Eosinophils Absolute: 0.2 10*3/uL (ref 0.0–0.7)
Eosinophils Relative: 3 % (ref 0–5)
HCT: 39.9 % (ref 36.0–46.0)
Hemoglobin: 13.7 g/dL (ref 12.0–15.0)
Lymphocytes Relative: 26 % (ref 12–46)
Lymphs Abs: 1.7 10*3/uL (ref 0.7–4.0)
MCH: 31.1 pg (ref 26.0–34.0)
MCHC: 34.3 g/dL (ref 30.0–36.0)
MCV: 90.5 fL (ref 78.0–100.0)
Monocytes Absolute: 0.5 10*3/uL (ref 0.1–1.0)
Monocytes Relative: 8 % (ref 3–12)
Neutro Abs: 4 10*3/uL (ref 1.7–7.7)
Neutrophils Relative %: 63 % (ref 43–77)
Platelets: 176 10*3/uL (ref 150–400)
RBC: 4.41 MIL/uL (ref 3.87–5.11)
RDW: 13 % (ref 11.5–15.5)
WBC: 6.4 10*3/uL (ref 4.0–10.5)

## 2013-09-07 LAB — TROPONIN I: Troponin I: <0.3 ng/mL (ref <0.30)

## 2013-09-07 MED ORDER — IOHEXOL 350 MG/ML SOLN
100.0000 mL | Freq: Once | INTRAVENOUS | Status: AC | PRN
  Administered 2013-09-07: 100 mL via INTRAVENOUS

## 2013-09-07 MED ORDER — SODIUM CHLORIDE 0.9 % IV SOLN
INTRAVENOUS | Status: DC
  Administered 2013-09-07: 09:00:00 via INTRAVENOUS

## 2013-09-07 NOTE — ED Notes (Signed)
Pt reports at work yesterday 2 hours of slight tachycardia and diaphoresis, that night pt started having central/left sided chest pain. Pain never completely resolved. Pt woke up with dull ache chest pain 2/10. Hx 10 years smoking, 25 years ago. Denies SOB.

## 2013-09-07 NOTE — ED Notes (Addendum)
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  She states she simply thought it was a "hot flash".  She became concerned when she experienced some dull central area chest discomfort yesterday evening while shopping at a local grocery store.  This discomfort persists.  She states she took an Ambien yesterday evening, and was able to sleep undisturbed during the night.  She is healthy-looking with her skin being normal, warm and dry and she is breathing normally.  She denies any recent long-distance travel, nor any periods of prolonged immoility.

## 2013-09-07 NOTE — ED Provider Notes (Signed)
CSN: [id number]     Arrival date & time 09/07/13  0840 History   First MD Initiated Contact with Patient 09/07/13 0845     Chief Complaint  Patient presents with  . Chest Pain   (Consider location/radiation/quality/duration/timing/severity/associated sxs/prior Treatment) Patient is a 52 y.o. female presenting with chest pain. The history is provided by the patient.  Chest Pain  patient here complaining of persistent midsternal chest discomfort that is worse with exertion. Symptoms started yesterday at 6 pm and to become more intense at times. Yesterday she had associated diaphoresis with this but no severe dyspnea. She had tachycardia with a heart rate up to 109 for 2 hours. Denies any leg pain or swelling. 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 Patient took aspirin yesterday and today without change in her symptoms. No fever or cough. No prior history of same. Denies any GI symptoms.  Past Medical History  Diagnosis Date  . IBS (irritable bowel syndrome)   . Anxiety   . Von Willebrand's disease   . Back pain   . Migraine    Past Surgical History  Procedure Laterality Date  . Cesarean section    . Left knee arthroscopy    . Laparoscopy removal of right ovarian cyst    . Pylonidal cyst removal    . Septoplasty    . Novasure ablation  12/2005   Family History  Problem Relation Age of Onset  . Hypertension Mother   . Arthritis Mother   . Hypertension Father   . Heart disease Father   . Lupus Sister   . Other Sister     mythensia gravis   History  Substance Use Topics  . Smoking status: Former Games developer  . Smokeless tobacco: Never Used  . Alcohol Use: 4.2 oz/week    7 Glasses of wine per week     Comment: glass every night   OB History   Grav Para Term Preterm Abortions TAB SAB Ect Mult Living   1 1       1 2      Review of Systems  Cardiovascular: Positive for chest pain.  All other systems reviewed and are negative.    Allergies  Sulfa  antibiotics  Home Medications   Current Outpatient Rx  Name  Route  Sig  Dispense  Refill  . Cholecalciferol (VITAMIN D PO)   Oral   Take by mouth.         . Estradiol (VAGIFEM) 10 MCG TABS vaginal tablet   Vaginal   Place 1 tablet (10 mcg total) vaginally 2 (two) times a week.   24 tablet   2   . fluconazole (DIFLUCAN) 200 MG tablet   Oral   Take 1 tablet (200 mg total) by mouth daily.   10 tablet   0   . LYSINE PO   Oral   Take by mouth.         . Multiple Vitamins-Minerals (MULTIVITAMIN PO)   Oral   Take by mouth.         . Norethindrone-Ethinyl Estradiol-Fe (GENERESSE) 0.8-25 MG-MCG tablet      Take active tab daily for three packages then placebo break.   3 Package   4   . nystatin-triamcinolone ointment (MYCOLOG)   Topical   Apply 1 application topically 2 (two) times daily.   30 g   1   . venlafaxine XR (EFFEXOR-XR) 75 MG 24 hr capsule      TAKE 1  CAPSULE BY MOUTH ONCE DAILY   90 capsule   4   . zolpidem (AMBIEN) 10 MG tablet      TAKE 1 TABLET BY MOUTH AT BEDTIME AS NEEDED FOR SLEEP   90 tablet   1    BP 128/81  Pulse 85  Temp(Src) 97.9 F (36.6 C) (Oral)  Resp 16  Ht 5\' 8"  (1.727 m)  Wt 170 lb (77.111 kg)  BMI 25.85 kg/m2  SpO2 98% Physical Exam  Nursing note and vitals reviewed. Constitutional: She is oriented to person, place, and time. She appears well-developed and well-nourished.  Non-toxic appearance. No distress.  HENT:  Head: Normocephalic and atraumatic.  Eyes: Conjunctivae, EOM and lids are normal. Pupils are equal, round, and reactive to light.  Neck: Normal range of motion. Neck supple. No tracheal deviation present. No mass present.  Cardiovascular: Normal rate, regular rhythm and normal heart sounds.  Exam reveals no gallop.   No murmur heard. Pulmonary/Chest: Effort normal and breath sounds normal. No stridor. No respiratory distress. She has no decreased breath sounds. She has no wheezes. She has no rhonchi. She  has no rales.  Abdominal: Soft. Normal appearance and bowel sounds are normal. She exhibits no distension. There is no tenderness. There is no rebound and no CVA tenderness.  Musculoskeletal: Normal range of motion. She exhibits no edema and no tenderness.  Neurological: She is alert and oriented to person, place, and time. She has normal strength. No cranial nerve deficit or sensory deficit. GCS eye subscore is 4. GCS verbal subscore is 5. GCS motor subscore is 6.  Skin: Skin is warm and dry. No abrasion and no rash noted.  Psychiatric: She has a normal mood and affect. Her speech is normal and behavior is normal.    ED Course  Procedures (including critical care time) Labs Review Labs Reviewed  CBC WITH DIFFERENTIAL  COMPREHENSIVE METABOLIC PANEL  TROPONIN I   Imaging Review No results found.  EKG Interpretation    Date/Time:  Wednesday September 07 2013 08:49:13 EST Ventricular Rate:  80 PR Interval:  107 QRS Duration: 95 QT Interval:  361 QTC Calculation: 416 R Axis:   31 Text Interpretation:  Sinus rhythm Short PR interval RSR' in V1 or V2, probably normal variant No significant change since last tracing Confirmed by Jermiyah Ricotta  MD, Desarae Placide ([id number]) on 09/07/2013 9:09:22 AM            MDM  No diagnosis found. Patient with negative chest CT for PE. Her chest pain symptoms are very atypical. Do not think she has ACS. She's had persistent symptoms now for over 12 hours without typical anginal characteristics. She will followup with her Dr. as needed   Toy Baker, MD 09/07/13 1054

## 2013-09-07 NOTE — ED Notes (Signed)
She remains in no distress, and ambulates to b.r. Without difficulty.

## 2013-11-25 ENCOUNTER — Other Ambulatory Visit: Payer: Self-pay

## 2013-11-25 ENCOUNTER — Other Ambulatory Visit: Payer: Self-pay | Admitting: Gynecology

## 2013-11-25 MED ORDER — VENLAFAXINE HCL ER 75 MG PO CP24: 75.0000 mg | ORAL_CAPSULE | Freq: Every morning | ORAL | Status: DC

## 2013-11-25 MED ORDER — ZOLPIDEM TARTRATE 10 MG PO TABS: 10.0000 mg | ORAL_TABLET | Freq: Every evening | ORAL | Status: DC | PRN

## 2013-11-25 MED ORDER — NORETHIN-ETH ESTRADIOL-FE 0.8-25 MG-MCG PO CHEW: CHEWABLE_TABLET | ORAL | Status: DC

## 2013-11-25 NOTE — Telephone Encounter (Signed)
Requesting a new prescription for Ambien to be sent to mail order pharmacy.

## 2013-11-25 NOTE — Telephone Encounter (Signed)
Faxed Ambien Rx to [facility].

## 2013-11-28 ENCOUNTER — Other Ambulatory Visit: Payer: Self-pay

## 2013-11-28 MED ORDER — ESTRADIOL 10 MCG VA TABS: 1.0000 | ORAL_TABLET | VAGINAL | Status: DC

## 2013-12-19 ENCOUNTER — Other Ambulatory Visit: Payer: Self-pay

## 2013-12-19 MED ORDER — ZOLPIDEM TARTRATE 10 MG PO TABS: 10.0000 mg | ORAL_TABLET | Freq: Every evening | ORAL | Status: DC | PRN

## 2013-12-20 NOTE — Telephone Encounter (Signed)
Faxed rx to mail order pharmacy on sheet they had faxed [place].

## 2013-12-21 ENCOUNTER — Other Ambulatory Visit: Payer: Self-pay

## 2014-06-26 ENCOUNTER — Encounter: Payer: Self-pay | Admitting: Gynecology

## 2014-07-11 ENCOUNTER — Encounter: Payer: Self-pay | Admitting: Gynecology

## 2014-07-11 ENCOUNTER — Ambulatory Visit (INDEPENDENT_AMBULATORY_CARE_PROVIDER_SITE_OTHER): Payer: BC Managed Care – PPO | Admitting: Gynecology

## 2014-07-11 DIAGNOSIS — F3289 Other specified depressive episodes: Secondary | ICD-10-CM

## 2014-07-11 DIAGNOSIS — N898 Other specified noninflammatory disorders of vagina: Secondary | ICD-10-CM

## 2014-07-11 DIAGNOSIS — F329 Major depressive disorder, single episode, unspecified: Secondary | ICD-10-CM

## 2014-07-11 DIAGNOSIS — F32A Depression, unspecified: Secondary | ICD-10-CM

## 2014-07-11 DIAGNOSIS — N9489 Other specified conditions associated with female genital organs and menstrual cycle: Secondary | ICD-10-CM

## 2014-07-11 MED ORDER — ESTRADIOL 10 MCG VA TABS: 1.0000 | ORAL_TABLET | VAGINAL | Status: DC

## 2014-07-11 MED ORDER — ZOLPIDEM TARTRATE 10 MG PO TABS: 10.0000 mg | ORAL_TABLET | Freq: Every evening | ORAL | Status: DC | PRN

## 2014-07-11 MED ORDER — VENLAFAXINE HCL ER 150 MG PO CP24: 150.0000 mg | ORAL_CAPSULE | Freq: Every day | ORAL | Status: DC

## 2014-07-11 NOTE — Progress Notes (Signed)
[name] 01-03-1961 [id number]        53 y.o.  G1P1 Presents with 2 issues:  1. Worsening depression/anxiety. Dealing with aging parents and a very close friend who is ill along with her daily stressors. Had been on Effexor XR 75 mg. She increased her self to 2 pills daily and feels much better and would like to continue on this. She continues have some issues with sleeping using Ambien most nights. No suicide ideation and able to function with daily activities. 2. Vaginal dryness. On Vagifem twice weekly. No discharge or other symptoms. Mostly pain with intercourse.  Past medical history,surgical history, problem list, medications, allergies, family history and social history were all reviewed and documented in the EPIC chart.  Directed ROS with pertinent positives and negatives documented in the history of present illness/assessment and plan.   Assessment/Plan:  53 y.o. G1P1 with:  1. Worsening depression relieved on Effexor XR 150 mg. I wrote her for the new prescription and will continue her on that. Discussed possible mental health referral but she feels comfortable continuing as she is at this point. No suicide ideation. Patient knows to call if worsening depression or any other issues.  Prescription written for both the Effexor XR 150 mg and Ambien 10 mg 2. Vaginal dryness despite Vagifem twice weekly. Will increase to 3 times weekly. If continues to be a problem discussed possible estradiol vaginal cream instead of the Vagifem.   Patient will call me if it continues to be a problem. Prescription written for this.     Anastasio Auerbach MD, 2:56 PM 07/11/2014

## 2014-07-11 NOTE — Patient Instructions (Signed)
Increase the Vagifem to 3 times weekly. Call me if this continues be an issue. Call me if you have any questions about your Effexor medication.

## 2014-07-20 ENCOUNTER — Other Ambulatory Visit: Payer: Self-pay | Admitting: Gynecology

## 2014-08-09 ENCOUNTER — Other Ambulatory Visit: Payer: Self-pay

## 2014-08-09 MED ORDER — ZOLPIDEM TARTRATE 10 MG PO TABS: 10.0000 mg | ORAL_TABLET | Freq: Every evening | ORAL | Status: DC | PRN

## 2014-08-09 MED ORDER — VENLAFAXINE HCL ER 150 MG PO CP24: 150.0000 mg | ORAL_CAPSULE | Freq: Every day | ORAL | Status: DC

## 2014-08-09 NOTE — Telephone Encounter (Signed)
ambien rx faxed to them.

## 2014-08-14 ENCOUNTER — Encounter: Payer: Self-pay | Admitting: Gynecology

## 2014-08-22 ENCOUNTER — Other Ambulatory Visit (HOSPITAL_COMMUNITY)
Admission: RE | Admit: 2014-08-22 | Discharge: 2014-08-22 | Disposition: A | Discharge status: Home / Self Care | Payer: BC Managed Care – PPO | Source: Ambulatory Visit | Attending: Gynecology | Admitting: Gynecology

## 2014-08-22 ENCOUNTER — Ambulatory Visit (INDEPENDENT_AMBULATORY_CARE_PROVIDER_SITE_OTHER): Payer: BC Managed Care – PPO | Admitting: Gynecology

## 2014-08-22 ENCOUNTER — Encounter: Payer: Self-pay | Admitting: Gynecology

## 2014-08-22 VITALS — BP 134/82 | Ht 68.0 in | Wt 178.0 lb

## 2014-08-22 DIAGNOSIS — Z01419 Encounter for gynecological examination (general) (routine) without abnormal findings: Secondary | ICD-10-CM | POA: Diagnosis not present

## 2014-08-22 DIAGNOSIS — Z79890 Hormone replacement therapy (postmenopausal): Secondary | ICD-10-CM

## 2014-08-22 DIAGNOSIS — B370 Candidal stomatitis: Secondary | ICD-10-CM

## 2014-08-22 DIAGNOSIS — Z1151 Encounter for screening for human papillomavirus (HPV): Secondary | ICD-10-CM | POA: Insufficient documentation

## 2014-08-22 DIAGNOSIS — G4700 Insomnia, unspecified: Secondary | ICD-10-CM

## 2014-08-22 DIAGNOSIS — N952 Postmenopausal atrophic vaginitis: Secondary | ICD-10-CM

## 2014-08-22 LAB — COMPREHENSIVE METABOLIC PANEL
ALT: 23 U/L (ref 0–35)
AST: 20 U/L (ref 0–37)
Albumin: 4.1 g/dL (ref 3.5–5.2)
Alkaline Phosphatase: 57 U/L (ref 39–117)
BUN: 12 mg/dL (ref 6–23)
CO2: 23 mEq/L (ref 19–32)
Calcium: 9.1 mg/dL (ref 8.4–10.5)
Chloride: 100 mEq/L (ref 96–112)
Creat: 0.82 mg/dL (ref 0.50–1.10)
Glucose, Bld: 90 mg/dL (ref 70–99)
Potassium: 4.8 mEq/L (ref 3.5–5.3)
Sodium: 136 mEq/L (ref 135–145)
Total Bilirubin: 0.3 mg/dL (ref 0.2–1.2)
Total Protein: 6.4 g/dL (ref 6.0–8.3)

## 2014-08-22 LAB — CBC WITH DIFFERENTIAL/PLATELET
Basophils Absolute: 0 10*3/uL (ref 0.0–0.1)
Basophils Relative: 0 % (ref 0–1)
Eosinophils Absolute: 0.2 10*3/uL (ref 0.0–0.7)
Eosinophils Relative: 3 % (ref 0–5)
HCT: 40.5 % (ref 36.0–46.0)
Hemoglobin: 13.4 g/dL (ref 12.0–15.0)
Lymphocytes Relative: 23 % (ref 12–46)
Lymphs Abs: 1.7 10*3/uL (ref 0.7–4.0)
MCH: 30.3 pg (ref 26.0–34.0)
MCHC: 33.1 g/dL (ref 30.0–36.0)
MCV: 91.6 fL (ref 78.0–100.0)
Monocytes Absolute: 0.5 10*3/uL (ref 0.1–1.0)
Monocytes Relative: 7 % (ref 3–12)
Neutro Abs: 4.9 10*3/uL (ref 1.7–7.7)
Neutrophils Relative %: 67 % (ref 43–77)
Platelets: 248 10*3/uL (ref 150–400)
RBC: 4.42 MIL/uL (ref 3.87–5.11)
RDW: 13.4 % (ref 11.5–15.5)
WBC: 7.3 10*3/uL (ref 4.0–10.5)

## 2014-08-22 LAB — TSH: TSH: 2.001 u[IU]/mL (ref 0.350–4.500)

## 2014-08-22 LAB — LIPID PANEL
Cholesterol: 179 mg/dL (ref 0–200)
HDL: 72 mg/dL (ref >39)
LDL Cholesterol: 61 mg/dL (ref 0–99)
Total CHOL/HDL Ratio: 2.5 Ratio
Triglycerides: 231 mg/dL — ABNORMAL HIGH (ref <150)
VLDL: 46 mg/dL — ABNORMAL HIGH (ref 0–40)

## 2014-08-22 MED ORDER — FLUCONAZOLE 200 MG PO TABS: 200.0000 mg | ORAL_TABLET | Freq: Every day | ORAL | Status: DC

## 2014-08-22 MED ORDER — ZOLPIDEM TARTRATE 10 MG PO TABS: 10.0000 mg | ORAL_TABLET | Freq: Every evening | ORAL | Status: DC | PRN

## 2014-08-22 MED ORDER — ESTRADIOL 10 MCG VA TABS: 1.0000 | ORAL_TABLET | VAGINAL | Status: DC

## 2014-08-22 MED ORDER — VENLAFAXINE HCL ER 150 MG PO CP24: 150.0000 mg | ORAL_CAPSULE | Freq: Every day | ORAL | Status: DC

## 2014-08-22 MED ORDER — ESTRADIOL-NORETHINDRONE ACET 1-0.5 MG PO TABS: 1.0000 | ORAL_TABLET | Freq: Every day | ORAL | Status: DC

## 2014-08-22 NOTE — Progress Notes (Signed)
[name] 10/25/1960 [id number]        53 y.o.  G1P1 for annual exam.  Several issues noted below.  Past medical history,surgical history, problem list, medications, allergies, family history and social history were all reviewed and documented as reviewed in the EPIC chart.  ROS:  12 system ROS performed with pertinent positives and negatives included in the history, assessment and plan.   Additional significant findings :  none   Exam: Kim Counsellor Vitals:   08/22/14 1054  BP: 134/82  Height: 5\' 8"  (1.727 m)  Weight: 178 lb (80.74 kg)   General appearance:  Normal affect, orientation and appearance. Skin: Grossly normal HEENT: Without gross lesions.  No cervical or supraclavicular adenopathy. Thyroid normal.  Lungs:  Clear without wheezing, rales or rhonchi Cardiac: RR, without RMG Abdominal:  Soft, nontender, without masses, guarding, rebound, organomegaly or hernia Breasts:  Examined lying and sitting without masses, retractions, discharge or axillary adenopathy. Pelvic:  Ext/BUS/vagina normal  Cervix normal. Pap/HPV  Uterus anteverted, normal size, shape and contour, midline and mobile nontender   Adnexa  Without masses or tenderness    Anus and perineum  Normal   Rectovaginal  Normal sphincter tone without palpated masses or tenderness.    Assessment/Plan:  53 y.o. G1P1 female for annual exam.   1. Postmenopausal/HRT/atrophic vaginitis. Patient continues on low-dose oral contraceptives for hormone replacement. Is not having any vaginal bleeding.  Also using the Vagifem 3 times weekly for vaginal dryness which seems to be helping. Reviewed transitioning from the oral contraceptives to an HRT such as Activella to see how she does with this. Patient's interested to try this and she will go ahead and stop her oral contraceptives and start on Activella 1/0.5. She will call me if she does any vaginal bleeding or has any issues doing this.  Assuming that she tolerates  this we will discuss weaning next year.  She also wants to continue on the Vagifem 3 times weekly and I refilled 1 year. 2. Complaints of burning mouth. Does have a history of thrush in the past. Her exam overall is normal. Will cover for thrush with Diflucan 200 mg daily 10 days. Follow up if her symptoms persist or recur. 3. Pap smear 2012.  Pap/HPV today. No history of significant abnormal Pap smears previously.  Assuming this Pap smear is normal plan repeat at 3-5 year interval per current screening guidelines. 4. Mammography 06/2014. Continue with annual mammography. SBE monthly reviewed. 5. Colonoscopy never. Patient plans on scheduling this coming year. 6. DEXA never. Will plan further into the menopause. Check vitamin D level today. 7. Insomnia. Patient continues on Ambien 10 mg nightly and wants to continue. Refill 1 year provided. 8. Depression/anxiety. Patient doing well on the Effexor XR 150 mg daily.  Refill 1 year provided. 9. Health maintenance. Baseline CBC, comprehensive metabolic panel lipid profile urinalysis vitamin D TSH ordered.  Follow up if any issues switching to her HRT, otherwise 1 year for annual exam..     Anastasio Auerbach MD, 11:35 AM 08/22/2014

## 2014-08-22 NOTE — Patient Instructions (Signed)
Take the Diflucan daily for 10 days. Follow up if your mouth symptoms persist. Stop the control pills and start the hormone replacement daily area call if any issues with this or any vaginal bleeding.  You may obtain a copy of any labs that were done today by logging onto MyChart as outlined in the instructions provided with your AVS (after visit summary). The office will not call with normal lab results but certainly if there are any significant abnormalities then we will contact you.   Health Maintenance, Female A healthy lifestyle and preventative care can promote health and wellness.  Maintain regular health, dental, and eye exams.  Eat a healthy diet. Foods like vegetables, fruits, whole grains, low-fat dairy products, and lean protein foods contain the nutrients you need without too many calories. Decrease your intake of foods high in solid fats, added sugars, and salt. Get information about a proper diet from your caregiver, if necessary.  Regular physical exercise is one of the most important things you can do for your health. Most adults should get at least 150 minutes of moderate-intensity exercise (any activity that increases your heart rate and causes you to sweat) each week. In addition, most adults need muscle-strengthening exercises on 2 or more days a week.   Maintain a healthy weight. The body mass index (BMI) is a screening tool to identify possible weight problems. It provides an estimate of body fat based on height and weight. Your caregiver can help determine your BMI, and can help you achieve or maintain a healthy weight. For adults 20 years and older:  A BMI below 18.5 is considered underweight.  A BMI of 18.5 to 24.9 is normal.  A BMI of 25 to 29.9 is considered overweight.  A BMI of 30 and above is considered obese.  Maintain normal blood lipids and cholesterol by exercising and minimizing your intake of saturated fat. Eat a balanced diet with plenty of fruits and  vegetables. Blood tests for lipids and cholesterol should begin at age 30 and be repeated every 5 years. If your lipid or cholesterol levels are high, you are over 50, or you are a high risk for heart disease, you may need your cholesterol levels checked more frequently.Ongoing high lipid and cholesterol levels should be treated with medicines if diet and exercise are not effective.  If you smoke, find out from your caregiver how to quit. If you do not use tobacco, do not start.  Lung cancer screening is recommended for adults aged 16 80 years who are at high risk for developing lung cancer because of a history of smoking. Yearly low-dose computed tomography (CT) is recommended for people who have at least a 30-pack-year history of smoking and are a current smoker or have quit within the past 15 years. A pack year of smoking is smoking an average of 1 pack of cigarettes a day for 1 year (for example: 1 pack a day for 30 years or 2 packs a day for 15 years). Yearly screening should continue until the smoker has stopped smoking for at least 15 years. Yearly screening should also be stopped for people who develop a health problem that would prevent them from having lung cancer treatment.  If you are pregnant, do not drink alcohol. If you are breastfeeding, be very cautious about drinking alcohol. If you are not pregnant and choose to drink alcohol, do not exceed 1 drink per day. One drink is considered to be 12 ounces (355 mL) of beer,  5 ounces (148 mL) of wine, or 1.5 ounces (44 mL) of liquor.  Avoid use of street drugs. Do not share needles with anyone. Ask for help if you need support or instructions about stopping the use of drugs.  High blood pressure causes heart disease and increases the risk of stroke. Blood pressure should be checked at least every 1 to 2 years. Ongoing high blood pressure should be treated with medicines, if weight loss and exercise are not effective.  If you are 39 to 52 years  old, ask your caregiver if you should take aspirin to prevent strokes.  Diabetes screening involves taking a blood sample to check your fasting blood sugar level. This should be done once every 3 years, after age 47, if you are within normal weight and without risk factors for diabetes. Testing should be considered at a younger age or be carried out more frequently if you are overweight and have at least 1 risk factor for diabetes.  Breast cancer screening is essential preventative care for women. You should practice "breast self-awareness." This means understanding the normal appearance and feel of your breasts and may include breast self-examination. Any changes detected, no matter how small, should be reported to a caregiver. Women in their 61s and 30s should have a clinical breast exam (CBE) by a caregiver as part of a regular health exam every 1 to 3 years. After age 58, women should have a CBE every year. Starting at age 88, women should consider having a mammogram (breast X-ray) every year. Women who have a family history of breast cancer should talk to their caregiver about genetic screening. Women at a high risk of breast cancer should talk to their caregiver about having an MRI and a mammogram every year.  Breast cancer gene (BRCA)-related cancer risk assessment is recommended for women who have family members with BRCA-related cancers. BRCA-related cancers include breast, ovarian, tubal, and peritoneal cancers. Having family members with these cancers may be associated with an increased risk for harmful changes (mutations) in the breast cancer genes BRCA1 and BRCA2. Results of the assessment will determine the need for genetic counseling and BRCA1 and BRCA2 testing.  The Pap test is a screening test for cervical cancer. Women should have a Pap test starting at age 15. Between ages 40 and 48, Pap tests should be repeated every 2 years. Beginning at age 67, you should have a Pap test every 3 years  as long as the past 3 Pap tests have been normal. If you had a hysterectomy for a problem that was not cancer or a condition that could lead to cancer, then you no longer need Pap tests. If you are between ages 66 and 47, and you have had normal Pap tests going back 10 years, you no longer need Pap tests. If you have had past treatment for cervical cancer or a condition that could lead to cancer, you need Pap tests and screening for cancer for at least 20 years after your treatment. If Pap tests have been discontinued, risk factors (such as a new sexual partner) need to be reassessed to determine if screening should be resumed. Some women have medical problems that increase the chance of getting cervical cancer. In these cases, your caregiver may recommend more frequent screening and Pap tests.  The human papillomavirus (HPV) test is an additional test that may be used for cervical cancer screening. The HPV test looks for the virus that can cause the cell changes on the  cervix. The cells collected during the Pap test can be tested for HPV. The HPV test could be used to screen women aged 36 years and older, and should be used in women of any age who have unclear Pap test results. After the age of 94, women should have HPV testing at the same frequency as a Pap test.  Colorectal cancer can be detected and often prevented. Most routine colorectal cancer screening begins at the age of 52 and continues through age 61. However, your caregiver may recommend screening at an earlier age if you have risk factors for colon cancer. On a yearly basis, your caregiver may provide home test kits to check for hidden blood in the stool. Use of a small camera at the end of a tube, to directly examine the colon (sigmoidoscopy or colonoscopy), can detect the earliest forms of colorectal cancer. Talk to your caregiver about this at age 21, when routine screening begins. Direct examination of the colon should be repeated every 5 to 10  years through age 30, unless early forms of pre-cancerous polyps or small growths are found.  Hepatitis C blood testing is recommended for all people born from [birth year] through 1964 and any individual with known risks for hepatitis C.  Practice safe sex. Use condoms and avoid high-risk sexual practices to reduce the spread of sexually transmitted infections (STIs). Sexually active women aged 67 and younger should be checked for Chlamydia, which is a common sexually transmitted infection. Older women with new or multiple partners should also be tested for Chlamydia. Testing for other STIs is recommended if you are sexually active and at increased risk.  Osteoporosis is a disease in which the bones lose minerals and strength with aging. This can result in serious bone fractures. The risk of osteoporosis can be identified using a bone density scan. Women ages 20 and over and women at risk for fractures or osteoporosis should discuss screening with their caregivers. Ask your caregiver whether you should be taking a calcium supplement or vitamin D to reduce the rate of osteoporosis.  Menopause can be associated with physical symptoms and risks. Hormone replacement therapy is available to decrease symptoms and risks. You should talk to your caregiver about whether hormone replacement therapy is right for you.  Use sunscreen. Apply sunscreen liberally and repeatedly throughout the day. You should seek shade when your shadow is shorter than you. Protect yourself by wearing long sleeves, pants, a wide-brimmed hat, and sunglasses year round, whenever you are outdoors.  Notify your caregiver of new moles or changes in moles, especially if there is a change in shape or color. Also notify your caregiver if a mole is larger than the size of a pencil eraser.  Stay current with your immunizations. Document Released: 04/14/2011 Document Revised: 01/24/2013 Document Reviewed: 04/14/2011 [facility] Patient Information 2014  [place].

## 2014-08-22 NOTE — Addendum Note (Signed)
Addended by: Nelva Nay on: 08/22/2014 11:44 AM   Modules accepted: Orders, SmartSet

## 2014-08-23 ENCOUNTER — Encounter: Payer: Self-pay | Admitting: Gynecology

## 2014-08-23 LAB — URINALYSIS W MICROSCOPIC + REFLEX CULTURE
Bilirubin Urine: NEGATIVE
Casts: NONE SEEN
Crystals: NONE SEEN
Glucose, UA: NEGATIVE mg/dL
Hgb urine dipstick: NEGATIVE
Ketones, ur: NEGATIVE mg/dL
Leukocytes, UA: NEGATIVE
Nitrite: NEGATIVE
Protein, ur: NEGATIVE mg/dL
Specific Gravity, Urine: 1.01 (ref 1.005–1.030)
Squamous Epithelial / LPF: NONE SEEN
Urobilinogen, UA: 0.2 mg/dL (ref 0.0–1.0)
pH: 6 (ref 5.0–8.0)

## 2014-08-23 LAB — VITAMIN D 25 HYDROXY (VIT D DEFICIENCY, FRACTURES): Vit D, 25-Hydroxy: 47 ng/mL (ref 30–89)

## 2014-08-24 LAB — URINE CULTURE
Colony Count: NO GROWTH
Organism ID, Bacteria: NO GROWTH

## 2014-08-24 LAB — CYTOLOGY - PAP

## 2014-09-18 ENCOUNTER — Telehealth: Payer: Self-pay | Admitting: *Deleted

## 2014-09-18 NOTE — Telephone Encounter (Signed)
Pt called stating [facility] never received her Rx for Ambien 10mg  #90. Rx was set on phone in and was never called in. I called Rx in, only 1 refill could be done because it is Ambien.

## 2014-09-19 ENCOUNTER — Other Ambulatory Visit: Payer: Self-pay | Admitting: Gynecology

## 2014-09-22 ENCOUNTER — Emergency Department (INDEPENDENT_AMBULATORY_CARE_PROVIDER_SITE_OTHER)
Admission: EM | Admit: 2014-09-22 | Discharge: 2014-09-22 | Disposition: A | Discharge status: Home / Self Care | Payer: BC Managed Care – PPO | Source: Home / Self Care | Attending: Family Medicine | Admitting: Family Medicine

## 2014-09-22 ENCOUNTER — Encounter (HOSPITAL_COMMUNITY): Payer: Self-pay | Admitting: *Deleted

## 2014-09-22 ENCOUNTER — Ambulatory Visit (HOSPITAL_COMMUNITY): Payer: BC Managed Care – PPO | Attending: Family Medicine

## 2014-09-22 DIAGNOSIS — R05 Cough: Secondary | ICD-10-CM | POA: Diagnosis present

## 2014-09-22 DIAGNOSIS — J029 Acute pharyngitis, unspecified: Secondary | ICD-10-CM | POA: Insufficient documentation

## 2014-09-22 DIAGNOSIS — J45901 Unspecified asthma with (acute) exacerbation: Secondary | ICD-10-CM | POA: Diagnosis not present

## 2014-09-22 DIAGNOSIS — R0989 Other specified symptoms and signs involving the circulatory and respiratory systems: Secondary | ICD-10-CM | POA: Diagnosis present

## 2014-09-22 MED ORDER — METHYLPREDNISOLONE ACETATE 80 MG/ML IJ SUSP
INTRAMUSCULAR | Status: AC
  Filled 2014-09-22: qty 1

## 2014-09-22 MED ORDER — IPRATROPIUM BROMIDE 0.02 % IN SOLN
RESPIRATORY_TRACT | Status: AC
  Filled 2014-09-22: qty 2.5

## 2014-09-22 MED ORDER — METHYLPREDNISOLONE ACETATE PF 80 MG/ML IJ SUSP
80.0000 mg | Freq: Once | INTRAMUSCULAR | Status: AC
  Administered 2014-09-22: 80 mg via INTRAMUSCULAR

## 2014-09-22 MED ORDER — ALBUTEROL SULFATE (2.5 MG/3ML) 0.083% IN NEBU
5.0000 mg | INHALATION_SOLUTION | Freq: Once | RESPIRATORY_TRACT | Status: AC
  Administered 2014-09-22: 5 mg via RESPIRATORY_TRACT

## 2014-09-22 MED ORDER — HYDROCOD POLST-CHLORPHEN POLST 10-8 MG/5ML PO LQCR: 5.0000 mL | Freq: Two times a day (BID) | ORAL | Status: DC | PRN

## 2014-09-22 MED ORDER — IPRATROPIUM BROMIDE 0.02 % IN SOLN
0.5000 mg | Freq: Once | RESPIRATORY_TRACT | Status: AC
  Administered 2014-09-22: 0.5 mg via RESPIRATORY_TRACT

## 2014-09-22 MED ORDER — MOXIFLOXACIN HCL 400 MG PO TABS: 400.0000 mg | ORAL_TABLET | Freq: Every day | ORAL | Status: DC

## 2014-09-22 MED ORDER — ALBUTEROL SULFATE (2.5 MG/3ML) 0.083% IN NEBU
INHALATION_SOLUTION | RESPIRATORY_TRACT | Status: AC
  Filled 2014-09-22: qty 6

## 2014-09-22 NOTE — ED Notes (Addendum)
Pt  Reports   3   Weeks    Ago   A  Productive  Cough        With  steriods     And  z  Pack        Symptoms  Got  Better  And   Became     Worse        10   Days        Ago       sorethroat  And  Congested    They  Did  X  Ray   At  That    Time

## 2014-09-22 NOTE — ED Provider Notes (Signed)
CSN: [id number]     Arrival date & time 09/22/14  1305 History   First MD Initiated Contact with Patient 09/22/14 1342     Chief Complaint  Patient presents with  . Cough   (Consider location/radiation/quality/duration/timing/severity/associated sxs/prior Treatment) Patient is a 53 y.o. female presenting with cough. The history is provided by the patient.  Cough Cough characteristics:  Non-productive, dry and harsh Severity:  Mild Onset quality:  Gradual Duration:  3 weeks Chronicity:  New Smoker: no   Context comment:  Seen at a [facility] center and given pred and z-pack , improved for few days, worse for past 2 d. Relieved by:  None tried Worsened by:  Nothing tried Ineffective treatments:  None tried Associated symptoms: rhinorrhea and wheezing   Associated symptoms: no fever, no shortness of breath, no sinus congestion and no sore throat     Past Medical History  Diagnosis Date  . IBS (irritable bowel syndrome)   . Anxiety   . Von Willebrand's disease   . Back pain   . Migraine    Past Surgical History  Procedure Laterality Date  . Cesarean section    . Left knee arthroscopy    . Laparoscopy removal of right ovarian cyst    . Pylonidal cyst removal    . Septoplasty    . Novasure ablation  12/2005   Family History  Problem Relation Age of Onset  . Hypertension Mother   . Arthritis Mother   . Hypertension Father   . Heart disease Father   . Lupus Sister   . Other Sister     mythensia gravis   History  Substance Use Topics  . Smoking status: Former Smoker -- 10 years    Types: Cigarettes  . Smokeless tobacco: Never Used  . Alcohol Use: 4.2 oz/week    7 Glasses of wine per week     Comment: glass every night   OB History    Gravida Para Term Preterm AB TAB SAB Ectopic Multiple Living   1 1       1 2      Review of Systems  Constitutional: Negative.  Negative for fever.  HENT: Positive for congestion and rhinorrhea. Negative for sore throat.   Respiratory:  Positive for cough and wheezing. Negative for shortness of breath.     Allergies  Sulfa antibiotics  Home Medications   Prior to Admission medications   Medication Sig Start Date End Date Taking? Authorizing Provider  chlorpheniramine-HYDROcodone (TUSSIONEX PENNKINETIC ER) 10-8 MG/5ML LQCR Take 5 mLs by mouth every 12 (twelve) hours as needed for cough. 09/22/14   Billy Fischer, MD  Estradiol (VAGIFEM) 10 MCG TABS vaginal tablet Place 1 tablet (10 mcg total) vaginally 3 (three) times a week. 08/22/14   Anastasio Auerbach, MD  Estradiol (VAGIFEM) 10 MCG TABS vaginal tablet Place 1 tablet (10 mcg total) vaginally 3 (three) times a week. 09/20/14   Anastasio Auerbach, MD  estradiol-norethindrone (ACTIVELLA) 1-0.5 MG per tablet Take 1 tablet by mouth daily. 08/22/14   Anastasio Auerbach, MD  fluconazole (DIFLUCAN) 200 MG tablet Take 1 tablet (200 mg total) by mouth daily. 08/22/14   Anastasio Auerbach, MD  ibuprofen (ADVIL,MOTRIN) 200 MG tablet Take 600-800 mg by mouth every 6 (six) hours as needed for moderate pain.    Historical Provider, MD  moxifloxacin (AVELOX) 400 MG tablet Take 1 tablet (400 mg total) by mouth daily. One tab daily 09/22/14   Billy Fischer, MD  Multiple Vitamins-Minerals (MULTIVITAMIN PO) Take 1 tablet by mouth every morning.     Historical Provider, MD  Norethindrone-Ethinyl Estradiol-Fe (GENERESSE) 0.8-25 MG-MCG tablet CHEW 1 ACTIVE TAB DAILY FOR3 PACKAGES, THEN PLACEBO   BREAK 07/20/14   Anastasio Auerbach, MD  Probiotic Product (PROBIOTIC PO) Take by mouth.    Historical Provider, MD  venlafaxine XR (EFFEXOR-XR) 150 MG 24 hr capsule Take 1 capsule (150 mg total) by mouth daily with breakfast. 08/22/14   Anastasio Auerbach, MD  zolpidem (AMBIEN) 10 MG tablet Take 1 tablet (10 mg total) by mouth at bedtime as needed for sleep. 08/22/14   Anastasio Auerbach, MD   BP 152/87 mmHg  Pulse 92  Temp(Src) 97.7 F (36.5 C) (Oral)  Resp 12  SpO2 97%  LMP 11/14/2012 Physical  Exam  Constitutional: She is oriented to person, place, and time. She appears well-developed and well-nourished. No distress.  HENT:  Right Ear: External ear normal.  Left Ear: External ear normal.  Mouth/Throat: Oropharynx is clear and moist.  Neck: Normal range of motion. Neck supple.  Pulmonary/Chest: Effort normal. She has wheezes in the right middle field and the right lower field. She has no rhonchi. She has no rales.  Lymphadenopathy:    She has no cervical adenopathy.  Neurological: She is alert and oriented to person, place, and time.  Skin: Skin is warm and dry.  Nursing note and vitals reviewed.   ED Course  Procedures (including critical care time) Labs Review Labs Reviewed - No data to display  Imaging Review Dg Chest 2 View  09/22/2014   CLINICAL DATA:  Three week history of cough, chest congestion and sore throat.  EXAM: CHEST  2 VIEW  COMPARISON:  Two-view chest x-ray and CTA chest 09/07/2013.  FINDINGS: Cardiomediastinal silhouette unremarkable, unchanged. Mildly prominent bronchovascular markings diffusely and mild central peribronchial thickening, more so than on the prior examination. Lungs otherwise clear. No localized airspace consolidation. No pleural effusions. No pneumothorax. Normal pulmonary vascularity. Mild degenerative changes involving the thoracic spine.  IMPRESSION: Mild changes of acute bronchitis and/or asthma without focal pneumonia.   Electronically Signed   By: Evangeline Dakin M.D.   On: 09/22/2014 14:42     MDM   1. Asthmatic bronchitis with acute exacerbation        Billy Fischer, MD 09/22/14 1524

## 2014-09-22 NOTE — Discharge Instructions (Signed)
Take all of medicine, drink lots of fluids, see your doctor if further problems °

## 2014-12-22 ENCOUNTER — Telehealth: Payer: Self-pay | Admitting: *Deleted

## 2014-12-22 NOTE — Telephone Encounter (Signed)
Prior authorization form filled out for Ambien 10 mg tablets faxed to [facility], will wait for response

## 2014-12-25 NOTE — Telephone Encounter (Signed)
Medication approved for 12/25/14-12/24/17.

## 2015-07-12 ENCOUNTER — Encounter: Payer: Self-pay | Admitting: Gynecology

## 2015-10-05 ENCOUNTER — Other Ambulatory Visit: Payer: Self-pay | Admitting: Gynecology

## 2015-11-26 ENCOUNTER — Other Ambulatory Visit: Payer: Self-pay | Admitting: Gastroenterology

## 2016-09-15 MED FILL — ESTRADIOL-NORETH 1.0-0.5 MG: 1-0.5 | 84 days supply | Qty: 84 | Fill #0

## 2016-09-18 MED FILL — TRINTELLIX 10 MG TABLET: 10 | 30 days supply | Qty: 30 | Fill #0

## 2016-09-18 MED FILL — ZOLPIDEM TARTRATE 5 MG TAB: 5 | 30 days supply | Qty: 30 | Fill #0

## 2016-10-16 MED FILL — ZOLPIDEM TARTRATE 5 MG TAB: 5 | 30 days supply | Qty: 30 | Fill #1

## 2016-10-16 MED FILL — TRINTELLIX 10 MG TABLET: 10 | 30 days supply | Qty: 30 | Fill #1

## 2016-11-14 MED FILL — TRINTELLIX 10 MG TABLET: 10 | 30 days supply | Qty: 30 | Fill #2

## 2016-11-14 MED FILL — ZOLPIDEM TARTRATE 5 MG TAB: 5 | 30 days supply | Qty: 30 | Fill #2

## 2016-12-05 DIAGNOSIS — D225 Melanocytic nevi of trunk: Secondary | ICD-10-CM | POA: Diagnosis not present

## 2016-12-05 DIAGNOSIS — L821 Other seborrheic keratosis: Secondary | ICD-10-CM | POA: Diagnosis not present

## 2016-12-05 DIAGNOSIS — D485 Neoplasm of uncertain behavior of skin: Secondary | ICD-10-CM | POA: Diagnosis not present

## 2016-12-05 DIAGNOSIS — D1801 Hemangioma of skin and subcutaneous tissue: Secondary | ICD-10-CM | POA: Diagnosis not present

## 2016-12-05 DIAGNOSIS — D2261 Melanocytic nevi of right upper limb, including shoulder: Secondary | ICD-10-CM | POA: Diagnosis not present

## 2016-12-12 MED FILL — ESTRADIOL-NORETH 1.0-0.5 MG: 1-0.5 | 84 days supply | Qty: 84 | Fill #1

## 2016-12-12 MED FILL — ZOLPIDEM TARTRATE 5 MG TAB: 5 | 30 days supply | Qty: 30 | Fill #3

## 2016-12-12 MED FILL — TRINTELLIX 10 MG TABLET: 10 | 30 days supply | Qty: 30 | Fill #3

## 2016-12-18 DIAGNOSIS — M25521 Pain in right elbow: Secondary | ICD-10-CM | POA: Diagnosis not present

## 2016-12-18 DIAGNOSIS — M7711 Lateral epicondylitis, right elbow: Secondary | ICD-10-CM | POA: Diagnosis not present

## 2016-12-24 DIAGNOSIS — M25521 Pain in right elbow: Secondary | ICD-10-CM | POA: Diagnosis not present

## 2016-12-26 ENCOUNTER — Other Ambulatory Visit (HOSPITAL_COMMUNITY)
Admission: RE | Admit: 2016-12-26 | Discharge: 2016-12-26 | Disposition: A | Discharge status: Home / Self Care | Payer: 59 | Source: Ambulatory Visit | Attending: Family Medicine | Admitting: Family Medicine

## 2016-12-26 ENCOUNTER — Other Ambulatory Visit: Payer: Self-pay | Admitting: Family Medicine

## 2016-12-26 DIAGNOSIS — Z1151 Encounter for screening for human papillomavirus (HPV): Secondary | ICD-10-CM | POA: Insufficient documentation

## 2016-12-26 DIAGNOSIS — M25521 Pain in right elbow: Secondary | ICD-10-CM | POA: Diagnosis not present

## 2016-12-26 DIAGNOSIS — G629 Polyneuropathy, unspecified: Secondary | ICD-10-CM | POA: Diagnosis not present

## 2016-12-26 DIAGNOSIS — Z01411 Encounter for gynecological examination (general) (routine) with abnormal findings: Secondary | ICD-10-CM | POA: Insufficient documentation

## 2016-12-26 DIAGNOSIS — Z5181 Encounter for therapeutic drug level monitoring: Secondary | ICD-10-CM | POA: Diagnosis not present

## 2016-12-26 DIAGNOSIS — F3341 Major depressive disorder, recurrent, in partial remission: Secondary | ICD-10-CM | POA: Diagnosis not present

## 2016-12-26 DIAGNOSIS — Z0000 Encounter for general adult medical examination without abnormal findings: Secondary | ICD-10-CM | POA: Diagnosis not present

## 2016-12-26 DIAGNOSIS — E785 Hyperlipidemia, unspecified: Secondary | ICD-10-CM | POA: Diagnosis not present

## 2016-12-26 DIAGNOSIS — Z124 Encounter for screening for malignant neoplasm of cervix: Secondary | ICD-10-CM | POA: Diagnosis not present

## 2016-12-26 DIAGNOSIS — G4700 Insomnia, unspecified: Secondary | ICD-10-CM | POA: Diagnosis not present

## 2016-12-30 DIAGNOSIS — M25521 Pain in right elbow: Secondary | ICD-10-CM | POA: Diagnosis not present

## 2016-12-30 LAB — CYTOLOGY - PAP
Diagnosis: NEGATIVE
HPV: NOT DETECTED

## 2017-01-02 DIAGNOSIS — M25521 Pain in right elbow: Secondary | ICD-10-CM | POA: Diagnosis not present

## 2017-01-05 DIAGNOSIS — M25521 Pain in right elbow: Secondary | ICD-10-CM | POA: Diagnosis not present

## 2017-01-07 DIAGNOSIS — M25521 Pain in right elbow: Secondary | ICD-10-CM | POA: Diagnosis not present

## 2017-01-08 MED FILL — TRINTELLIX 10 MG TABLET: 10 | 90 days supply | Qty: 90 | Fill #0

## 2017-01-09 MED FILL — ZOLPIDEM TARTRATE 5 MG TAB: 5 | 90 days supply | Qty: 90 | Fill #0

## 2017-01-20 DIAGNOSIS — M25521 Pain in right elbow: Secondary | ICD-10-CM | POA: Diagnosis not present

## 2017-02-26 DIAGNOSIS — M542 Cervicalgia: Secondary | ICD-10-CM | POA: Diagnosis not present

## 2017-02-26 DIAGNOSIS — M9902 Segmental and somatic dysfunction of thoracic region: Secondary | ICD-10-CM | POA: Diagnosis not present

## 2017-02-26 DIAGNOSIS — M2560 Stiffness of unspecified joint, not elsewhere classified: Secondary | ICD-10-CM | POA: Diagnosis not present

## 2017-02-26 DIAGNOSIS — M9901 Segmental and somatic dysfunction of cervical region: Secondary | ICD-10-CM | POA: Diagnosis not present

## 2017-02-26 DIAGNOSIS — R293 Abnormal posture: Secondary | ICD-10-CM | POA: Diagnosis not present

## 2017-02-27 MED FILL — ESTRADIOL-NORETH 1.0-0.5 MG: 1-0.5 | 28 days supply | Qty: 28 | Fill #2

## 2017-03-02 DIAGNOSIS — M2560 Stiffness of unspecified joint, not elsewhere classified: Secondary | ICD-10-CM | POA: Diagnosis not present

## 2017-03-02 DIAGNOSIS — M9901 Segmental and somatic dysfunction of cervical region: Secondary | ICD-10-CM | POA: Diagnosis not present

## 2017-03-02 DIAGNOSIS — M9902 Segmental and somatic dysfunction of thoracic region: Secondary | ICD-10-CM | POA: Diagnosis not present

## 2017-03-02 DIAGNOSIS — M542 Cervicalgia: Secondary | ICD-10-CM | POA: Diagnosis not present

## 2017-03-02 DIAGNOSIS — R293 Abnormal posture: Secondary | ICD-10-CM | POA: Diagnosis not present

## 2017-03-04 DIAGNOSIS — M9901 Segmental and somatic dysfunction of cervical region: Secondary | ICD-10-CM | POA: Diagnosis not present

## 2017-03-04 DIAGNOSIS — M9902 Segmental and somatic dysfunction of thoracic region: Secondary | ICD-10-CM | POA: Diagnosis not present

## 2017-03-04 DIAGNOSIS — M2560 Stiffness of unspecified joint, not elsewhere classified: Secondary | ICD-10-CM | POA: Diagnosis not present

## 2017-03-04 DIAGNOSIS — M542 Cervicalgia: Secondary | ICD-10-CM | POA: Diagnosis not present

## 2017-03-04 DIAGNOSIS — R293 Abnormal posture: Secondary | ICD-10-CM | POA: Diagnosis not present

## 2017-03-10 DIAGNOSIS — M9901 Segmental and somatic dysfunction of cervical region: Secondary | ICD-10-CM | POA: Diagnosis not present

## 2017-03-10 DIAGNOSIS — R293 Abnormal posture: Secondary | ICD-10-CM | POA: Diagnosis not present

## 2017-03-10 DIAGNOSIS — M542 Cervicalgia: Secondary | ICD-10-CM | POA: Diagnosis not present

## 2017-03-10 DIAGNOSIS — M2560 Stiffness of unspecified joint, not elsewhere classified: Secondary | ICD-10-CM | POA: Diagnosis not present

## 2017-03-10 DIAGNOSIS — M9902 Segmental and somatic dysfunction of thoracic region: Secondary | ICD-10-CM | POA: Diagnosis not present

## 2017-03-13 DIAGNOSIS — R293 Abnormal posture: Secondary | ICD-10-CM | POA: Diagnosis not present

## 2017-03-13 DIAGNOSIS — M9901 Segmental and somatic dysfunction of cervical region: Secondary | ICD-10-CM | POA: Diagnosis not present

## 2017-03-13 DIAGNOSIS — M2560 Stiffness of unspecified joint, not elsewhere classified: Secondary | ICD-10-CM | POA: Diagnosis not present

## 2017-03-13 DIAGNOSIS — M542 Cervicalgia: Secondary | ICD-10-CM | POA: Diagnosis not present

## 2017-03-13 DIAGNOSIS — M9902 Segmental and somatic dysfunction of thoracic region: Secondary | ICD-10-CM | POA: Diagnosis not present

## 2017-03-17 DIAGNOSIS — M9902 Segmental and somatic dysfunction of thoracic region: Secondary | ICD-10-CM | POA: Diagnosis not present

## 2017-03-17 DIAGNOSIS — M9901 Segmental and somatic dysfunction of cervical region: Secondary | ICD-10-CM | POA: Diagnosis not present

## 2017-03-17 DIAGNOSIS — M2560 Stiffness of unspecified joint, not elsewhere classified: Secondary | ICD-10-CM | POA: Diagnosis not present

## 2017-03-17 DIAGNOSIS — M542 Cervicalgia: Secondary | ICD-10-CM | POA: Diagnosis not present

## 2017-03-17 DIAGNOSIS — R293 Abnormal posture: Secondary | ICD-10-CM | POA: Diagnosis not present

## 2017-03-19 DIAGNOSIS — M9901 Segmental and somatic dysfunction of cervical region: Secondary | ICD-10-CM | POA: Diagnosis not present

## 2017-03-19 DIAGNOSIS — M2560 Stiffness of unspecified joint, not elsewhere classified: Secondary | ICD-10-CM | POA: Diagnosis not present

## 2017-03-19 DIAGNOSIS — R293 Abnormal posture: Secondary | ICD-10-CM | POA: Diagnosis not present

## 2017-03-19 DIAGNOSIS — M9902 Segmental and somatic dysfunction of thoracic region: Secondary | ICD-10-CM | POA: Diagnosis not present

## 2017-03-19 DIAGNOSIS — M542 Cervicalgia: Secondary | ICD-10-CM | POA: Diagnosis not present

## 2017-03-27 DIAGNOSIS — M9902 Segmental and somatic dysfunction of thoracic region: Secondary | ICD-10-CM | POA: Diagnosis not present

## 2017-03-27 DIAGNOSIS — M2560 Stiffness of unspecified joint, not elsewhere classified: Secondary | ICD-10-CM | POA: Diagnosis not present

## 2017-03-27 DIAGNOSIS — M9901 Segmental and somatic dysfunction of cervical region: Secondary | ICD-10-CM | POA: Diagnosis not present

## 2017-03-27 DIAGNOSIS — R293 Abnormal posture: Secondary | ICD-10-CM | POA: Diagnosis not present

## 2017-03-27 DIAGNOSIS — M542 Cervicalgia: Secondary | ICD-10-CM | POA: Diagnosis not present

## 2017-04-06 MED FILL — TRINTELLIX 10 MG TABLET: 10 | 90 days supply | Qty: 90 | Fill #1

## 2017-04-06 MED FILL — ESTRADIOL-NORETH 1.0-0.5 MG: 1-0.5 | 84 days supply | Qty: 84 | Fill #3

## 2017-04-07 MED FILL — ZOLPIDEM TARTRATE 5 MG TAB: 5 | 90 days supply | Qty: 90 | Fill #1

## 2017-04-09 DIAGNOSIS — M9901 Segmental and somatic dysfunction of cervical region: Secondary | ICD-10-CM | POA: Diagnosis not present

## 2017-04-09 DIAGNOSIS — M2560 Stiffness of unspecified joint, not elsewhere classified: Secondary | ICD-10-CM | POA: Diagnosis not present

## 2017-04-09 DIAGNOSIS — M9902 Segmental and somatic dysfunction of thoracic region: Secondary | ICD-10-CM | POA: Diagnosis not present

## 2017-04-09 DIAGNOSIS — R293 Abnormal posture: Secondary | ICD-10-CM | POA: Diagnosis not present

## 2017-04-09 DIAGNOSIS — M542 Cervicalgia: Secondary | ICD-10-CM | POA: Diagnosis not present

## 2017-04-13 DIAGNOSIS — M2560 Stiffness of unspecified joint, not elsewhere classified: Secondary | ICD-10-CM | POA: Diagnosis not present

## 2017-04-13 DIAGNOSIS — M9901 Segmental and somatic dysfunction of cervical region: Secondary | ICD-10-CM | POA: Diagnosis not present

## 2017-04-13 DIAGNOSIS — M542 Cervicalgia: Secondary | ICD-10-CM | POA: Diagnosis not present

## 2017-04-13 DIAGNOSIS — M9902 Segmental and somatic dysfunction of thoracic region: Secondary | ICD-10-CM | POA: Diagnosis not present

## 2017-04-13 DIAGNOSIS — R293 Abnormal posture: Secondary | ICD-10-CM | POA: Diagnosis not present

## 2017-04-21 DIAGNOSIS — M9901 Segmental and somatic dysfunction of cervical region: Secondary | ICD-10-CM | POA: Diagnosis not present

## 2017-04-21 DIAGNOSIS — M9902 Segmental and somatic dysfunction of thoracic region: Secondary | ICD-10-CM | POA: Diagnosis not present

## 2017-04-21 DIAGNOSIS — M542 Cervicalgia: Secondary | ICD-10-CM | POA: Diagnosis not present

## 2017-04-21 DIAGNOSIS — M2560 Stiffness of unspecified joint, not elsewhere classified: Secondary | ICD-10-CM | POA: Diagnosis not present

## 2017-04-21 DIAGNOSIS — R293 Abnormal posture: Secondary | ICD-10-CM | POA: Diagnosis not present

## 2017-04-29 DIAGNOSIS — M9901 Segmental and somatic dysfunction of cervical region: Secondary | ICD-10-CM | POA: Diagnosis not present

## 2017-04-29 DIAGNOSIS — M542 Cervicalgia: Secondary | ICD-10-CM | POA: Diagnosis not present

## 2017-04-29 DIAGNOSIS — M2560 Stiffness of unspecified joint, not elsewhere classified: Secondary | ICD-10-CM | POA: Diagnosis not present

## 2017-04-29 DIAGNOSIS — M9902 Segmental and somatic dysfunction of thoracic region: Secondary | ICD-10-CM | POA: Diagnosis not present

## 2017-04-29 DIAGNOSIS — R293 Abnormal posture: Secondary | ICD-10-CM | POA: Diagnosis not present

## 2017-05-20 DIAGNOSIS — H52223 Regular astigmatism, bilateral: Secondary | ICD-10-CM | POA: Diagnosis not present

## 2017-05-20 DIAGNOSIS — H524 Presbyopia: Secondary | ICD-10-CM | POA: Diagnosis not present

## 2017-07-06 MED FILL — ZOLPIDEM TARTRATE 5 MG TAB: 5 | 90 days supply | Qty: 90 | Fill #0

## 2017-07-06 MED FILL — TRINTELLIX 10 MG TABLET: 10 | 90 days supply | Qty: 90 | Fill #2

## 2017-07-20 MED FILL — ESTRADIOL-NORETH 1.0-0.5 MG: 1-0.5 | 56 days supply | Qty: 56 | Fill #4

## 2017-07-27 DIAGNOSIS — R110 Nausea: Secondary | ICD-10-CM | POA: Diagnosis not present

## 2017-07-27 DIAGNOSIS — J3489 Other specified disorders of nose and nasal sinuses: Secondary | ICD-10-CM | POA: Diagnosis not present

## 2017-07-27 DIAGNOSIS — N390 Urinary tract infection, site not specified: Secondary | ICD-10-CM | POA: Diagnosis not present

## 2017-07-27 DIAGNOSIS — R309 Painful micturition, unspecified: Secondary | ICD-10-CM | POA: Diagnosis not present

## 2017-07-27 MED FILL — CIPROFLOXACIN HCL 500 MG TA: 500 | 3 days supply | Qty: 6 | Fill #0

## 2017-07-27 MED FILL — ONDANSETRON HCL 4 MG TABLET: 4 | 3 days supply | Qty: 10 | Fill #0

## 2017-07-27 MED FILL — AMOX TR-K CLV 875-125 MG TA: 875-125 | 10 days supply | Qty: 20 | Fill #0

## 2017-08-13 DIAGNOSIS — L570 Actinic keratosis: Secondary | ICD-10-CM | POA: Diagnosis not present

## 2017-09-16 DIAGNOSIS — F3341 Major depressive disorder, recurrent, in partial remission: Secondary | ICD-10-CM | POA: Diagnosis not present

## 2017-09-16 MED FILL — ZOLPIDEM TARTRATE 10 MG TAB: 10 | 90 days supply | Qty: 90 | Fill #0

## 2017-09-16 MED FILL — DOXYCYCLINE MONOHYDRATE 100: 100 | 10 days supply | Qty: 20 | Fill #0

## 2017-09-16 MED FILL — TRINTELLIX 20 MG TABLET: 20 | 90 days supply | Qty: 90 | Fill #0

## 2017-09-25 MED FILL — levoFLOXacin 500 MG TABS: 500 | 10 days supply | Qty: 10 | Fill #0

## 2017-10-27 DIAGNOSIS — N951 Menopausal and female climacteric states: Secondary | ICD-10-CM | POA: Diagnosis not present

## 2017-10-27 DIAGNOSIS — M1990 Unspecified osteoarthritis, unspecified site: Secondary | ICD-10-CM | POA: Diagnosis not present

## 2017-10-27 MED FILL — ESTRADIOL 0.05 MG PATCH: 0.05 | 28 days supply | Qty: 8 | Fill #0

## 2017-10-27 MED FILL — PROGESTERONE 100 MG CAPSULE: 100 | 90 days supply | Qty: 90 | Fill #0

## 2017-11-16 MED FILL — ESTRADIOL 0.1 MG PATCH: 0.1 | 28 days supply | Qty: 8 | Fill #0

## 2017-11-24 DIAGNOSIS — M65872 Other synovitis and tenosynovitis, left ankle and foot: Secondary | ICD-10-CM | POA: Diagnosis not present

## 2017-11-24 DIAGNOSIS — M25775 Osteophyte, left foot: Secondary | ICD-10-CM | POA: Diagnosis not present

## 2017-11-24 DIAGNOSIS — M722 Plantar fascial fibromatosis: Secondary | ICD-10-CM | POA: Diagnosis not present

## 2017-12-14 MED FILL — ESTRADIOL 0.1 MG PATCH: 0.1 | 28 days supply | Qty: 8 | Fill #1

## 2017-12-14 MED FILL — ZOLPIDEM TARTRATE 10 MG TAB: 10 | 90 days supply | Qty: 90 | Fill #1

## 2017-12-14 MED FILL — TRINTELLIX 20 MG TABLET: 20 | 90 days supply | Qty: 90 | Fill #1

## 2018-01-14 DIAGNOSIS — Z1231 Encounter for screening mammogram for malignant neoplasm of breast: Secondary | ICD-10-CM | POA: Diagnosis not present

## 2018-01-21 MED FILL — PROGESTERONE MICRONIZED 100: 100 | 90 days supply | Qty: 90 | Fill #1

## 2018-01-21 MED FILL — ESTRADIOL 0.1 MG PATCH: 0.1 | 28 days supply | Qty: 8 | Fill #2

## 2018-01-25 DIAGNOSIS — R21 Rash and other nonspecific skin eruption: Secondary | ICD-10-CM | POA: Diagnosis not present

## 2018-01-25 DIAGNOSIS — N898 Other specified noninflammatory disorders of vagina: Secondary | ICD-10-CM | POA: Diagnosis not present

## 2018-01-25 MED FILL — KETOCONAZOLE 2% CREAM: 2 | 10 days supply | Qty: 30 | Fill #0

## 2018-01-25 MED FILL — FLUCONAZOLE 150 MG TABS: 150 | 1 days supply | Qty: 1 | Fill #0

## 2018-02-03 MED FILL — FLUCONAZOLE 150 MG TABS: 150 | 1 days supply | Qty: 1 | Fill #1

## 2018-02-12 MED FILL — ESTRADIOL 0.1 MG PATCH: 0.1 | 28 days supply | Qty: 8 | Fill #3

## 2018-03-15 MED FILL — TRINTELLIX 20 MG TABLET: 20 | 90 days supply | Qty: 90 | Fill #2

## 2018-03-15 MED FILL — ZOLPIDEM TARTRATE 10 MG TAB: 10 | 90 days supply | Qty: 90 | Fill #0

## 2018-03-15 MED FILL — ESTRADIOL 0.1 MG PATCH: 0.1 | 28 days supply | Qty: 8 | Fill #4

## 2018-03-31 DIAGNOSIS — A09 Infectious gastroenteritis and colitis, unspecified: Secondary | ICD-10-CM | POA: Diagnosis not present

## 2018-03-31 DIAGNOSIS — L304 Erythema intertrigo: Secondary | ICD-10-CM | POA: Diagnosis not present

## 2018-03-31 MED FILL — NYSTATIN 100,000 UNIT/GM CR: 100000 | 20 days supply | Qty: 60 | Fill #0

## 2018-03-31 MED FILL — FLUCONAZOLE 150 MG TABS: 150 | 15 days supply | Qty: 5 | Fill #0

## 2018-04-02 DIAGNOSIS — A09 Infectious gastroenteritis and colitis, unspecified: Secondary | ICD-10-CM | POA: Diagnosis not present

## 2018-04-18 MED FILL — FLUCONAZOLE 150 MG TABS: 150 | 15 days supply | Qty: 5 | Fill #1

## 2018-04-19 MED FILL — ESTRADIOL 0.1 MG PATCH: 0.1 | 28 days supply | Qty: 8 | Fill #5

## 2018-04-19 MED FILL — PROGESTERONE 100 MG CAPSULE: 100 | 90 days supply | Qty: 90 | Fill #2

## 2018-05-11 DIAGNOSIS — Z5181 Encounter for therapeutic drug level monitoring: Secondary | ICD-10-CM | POA: Diagnosis not present

## 2018-05-11 DIAGNOSIS — L299 Pruritus, unspecified: Secondary | ICD-10-CM | POA: Diagnosis not present

## 2018-05-11 DIAGNOSIS — M1990 Unspecified osteoarthritis, unspecified site: Secondary | ICD-10-CM | POA: Diagnosis not present

## 2018-05-11 DIAGNOSIS — E538 Deficiency of other specified B group vitamins: Secondary | ICD-10-CM | POA: Diagnosis not present

## 2018-05-11 MED FILL — predniSONE 20 MG TABS: 20 | 5 days supply | Qty: 10 | Fill #0

## 2018-05-25 MED FILL — ESTRADIOL 0.1 MG PATCH: 0.1 | 28 days supply | Qty: 8 | Fill #6

## 2018-05-25 MED FILL — FLUCONAZOLE 150 MG TABS: 150 | 15 days supply | Qty: 5 | Fill #2

## 2018-06-15 MED FILL — TRINTELLIX 20 MG TABLET: 20 | 90 days supply | Qty: 90 | Fill #3

## 2018-06-15 MED FILL — ZOLPIDEM TARTRATE 10 MG TAB: 10 | 90 days supply | Qty: 90 | Fill #1

## 2018-06-23 DIAGNOSIS — H52223 Regular astigmatism, bilateral: Secondary | ICD-10-CM | POA: Diagnosis not present

## 2018-06-23 DIAGNOSIS — H524 Presbyopia: Secondary | ICD-10-CM | POA: Diagnosis not present

## 2018-07-01 ENCOUNTER — Ambulatory Visit (INDEPENDENT_AMBULATORY_CARE_PROVIDER_SITE_OTHER): Payer: 59 | Admitting: Gynecology

## 2018-07-01 ENCOUNTER — Encounter: Payer: Self-pay | Admitting: Gynecology

## 2018-07-01 VITALS — BP 118/76 | Ht 68.0 in | Wt 179.0 lb

## 2018-07-01 DIAGNOSIS — Z01419 Encounter for gynecological examination (general) (routine) without abnormal findings: Secondary | ICD-10-CM | POA: Diagnosis not present

## 2018-07-01 DIAGNOSIS — Z79890 Hormone replacement therapy (postmenopausal): Secondary | ICD-10-CM

## 2018-07-01 DIAGNOSIS — N898 Other specified noninflammatory disorders of vagina: Secondary | ICD-10-CM | POA: Diagnosis not present

## 2018-07-01 LAB — WET PREP FOR TRICH, YEAST, CLUE

## 2018-07-01 MED ORDER — ESTRADIOL-NORETHINDRONE ACET 1-0.5 MG PO TABS: 1.0000 | ORAL_TABLET | Freq: Every day | ORAL | 12 refills | Status: DC

## 2018-07-01 MED FILL — ESTRADIOL-NORETHINDRONE ACE: 1-0.5 | 28 days supply | Qty: 28 | Fill #0

## 2018-07-01 MED FILL — ESTRADIOL 0.1 MG PATCH: 0.1 | 28 days supply | Qty: 8 | Fill #7

## 2018-07-01 NOTE — Progress Notes (Signed)
[name] Jun 21, 1961 [id number]        57 y.o.  G1P1 for annual gynecologic exam.  Has not been in for several years.  Sees her primary physician for gynecologic care.  Most recently has been having issues with recurrent yeast infections to include body rashes, vaginal and perianal outbreaks.  She has a long history of issues with yeast in the past.  Most recently has had some prolonged antibiotics for sinus issues which triggered her last outbreaks.  She is not having any issues at this time.  Has been started on HRT to include estradiol patch 0.1 mg and Prometrium 100 mg nightly.  Had done well with this until most recently with a new progesterone prescription she has had a lot of itching.  When stopping the progesterone the symptoms resolved.  Currently she is been off her progesterone over the last month.  No bleeding.  Otherwise doing well with good control of her menopausal symptoms  Past medical history,surgical history, problem list, medications, allergies, family history and social history were all reviewed and documented as reviewed in the EPIC chart.  ROS:  Performed with pertinent positives and negatives included in the history, assessment and plan.   Additional significant findings : None   Exam: Caryn Bee assistant Vitals:   07/01/18 0853  BP: 118/76  Weight: 179 lb (81.2 kg)  Height: 5\' 8"  (1.727 m)   Body mass index is 27.22 kg/m.  General appearance:  Normal affect, orientation and appearance. Skin: Grossly normal HEENT: Without gross lesions.  No cervical or supraclavicular adenopathy. Thyroid normal.  Lungs:  Clear without wheezing, rales or rhonchi Cardiac: RR, without RMG Abdominal:  Soft, nontender, without masses, guarding, rebound, organomegaly or hernia Breasts:  Examined lying and sitting without masses, retractions, discharge or axillary adenopathy. Pelvic:  Ext, BUS, Vagina: Normal  Cervix: Normal  Uterus: Anteverted, normal size, shape and  contour, midline and mobile nontender   Adnexa: Without masses or tenderness    Anus and perineum: Normal   Rectovaginal: Normal sphincter tone without palpated masses or tenderness.    Assessment/Plan:  57 y.o. G1P1 female for annual gynecologic exam.   1. Postmenopausal/HRT.  Currently on estradiol 0.1 mg patch.  Had been on Prometrium 100 mg nightly but discontinued a month ago due to itching.  We discussed HRT in general and the risks versus benefits to include symptom relief cardiovascular and bone health versus thrombosis risk such as stroke heart attack DVT in the breast cancer issue.  At this point the patient would like to continue on HRT as she is having good control of her symptoms.  We discussed various regimens to include trial of a different progesterone such as medroxyprogesterone or intermittent progesterone withdrawal monthly.  Ultimately we decided a trial of Activella to switch the regimens altogether as she is also starting to have a skin reaction to her patches.  We discussed the first-pass effect benefits of transdermal but she is low risk and I think it is appropriate to switch to an oral route.  To go ahead and start [place] 1/0.5 and see how she does with this.  She will call if she has any issues with this. 2. Mammography 10/2017.  Continue with annual mammography when due.  Breast exam normal today. 3. Pap smear/HPV normal 12/2016.  No Pap smear done today.  No history of abnormal Pap smears.  Recommend repeat Pap smear/HPV at 5-year interval per current screening guidelines. 4. DEXA never.  Will  plan further into the menopause. 5. Colonoscopy 2017.  Repeat at their recommended interval. 6. Health maintenance.  No routine lab work done as patient does this elsewhere.  Follow-up in 1 year, sooner if any issues with her HRT regiment.   Anastasio Auerbach MD, 9:34 AM 07/01/2018

## 2018-07-01 NOTE — Patient Instructions (Signed)
Follow-up if any issues with your new hormone replacement therapy regiment

## 2018-08-09 MED FILL — ESTRADIOL-NORETHINDRONE ACE: 1-0.5 | 28 days supply | Qty: 28 | Fill #1

## 2018-08-09 MED FILL — FLUCONAZOLE 150 MG TABS: 150 | 15 days supply | Qty: 5 | Fill #3

## 2018-09-01 ENCOUNTER — Ambulatory Visit (INDEPENDENT_AMBULATORY_CARE_PROVIDER_SITE_OTHER): Payer: 59 | Admitting: Gynecology

## 2018-09-01 ENCOUNTER — Encounter: Payer: Self-pay | Admitting: Gynecology

## 2018-09-01 VITALS — BP 118/76

## 2018-09-01 DIAGNOSIS — Z79890 Hormone replacement therapy (postmenopausal): Secondary | ICD-10-CM

## 2018-09-01 MED ORDER — VIVELLE-DOT 0.1 MG/24HR TD PTTW
1.0000 | MEDICATED_PATCH | TRANSDERMAL | 12 refills | Status: DC
Start: 2018-09-02 — End: 2021-04-09

## 2018-09-01 MED ORDER — PROGESTERONE MICRONIZED 100 MG PO CAPS: 100.0000 mg | ORAL_CAPSULE | Freq: Every day | ORAL | 11 refills | Status: DC

## 2018-09-01 MED FILL — PROGESTERONE 100 MG CAPSULE: 100 | 30 days supply | Qty: 30 | Fill #0

## 2018-09-01 MED FILL — DOTTI 0.1 MG/24HR PTTW: 0.1 | 28 days supply | Qty: 8 | Fill #0

## 2018-09-01 NOTE — Progress Notes (Signed)
    [name] 05/06/1961 [id number]        57 y.o.  G1P1 presents to discuss HRT.  Recently started back on Activella.  Had been on estradiol patches but was having skin reactions and itching thought to be secondary to the Prometrium.  Does not feel well on the [place] would prefer to go back on the estrogen patch.  She had allergic reactions to 2 with a generic patches but did well with the Vivelle brand.  She continued to itch after stopping the Prometrium and does not feel it is due to this and wants to go ahead and restart that also.  Past medical history,surgical history, problem list, medications, allergies, family history and social history were all reviewed and documented in the EPIC chart.  Directed ROS with pertinent positives and negatives documented in the history of present illness/assessment and plan.  Exam: Vitals:   09/01/18 1230  BP: 118/76   General appearance:  Normal   Assessment/Plan:  57 y.o. G1P1 with insufficient response to her current HRT regimen.  We will go ahead and reinitiate Vivelle 0.1 mg patch and Prometrium 100 mg nightly.  Annual prescription written.  D.a.w. for the pharmacy as she had issues with the generics.  She will follow-up with me if she continues to have issues with the HRT.    Anastasio Auerbach MD, 1:06 PM 09/01/2018

## 2018-09-01 NOTE — Patient Instructions (Signed)
Start back on the estrogen patch and progesterone nightly.  Let me know if you have any issues with this.

## 2018-09-13 MED FILL — FLUCONAZOLE 150 MG TABS: 150 | 15 days supply | Qty: 5 | Fill #4

## 2018-09-14 MED FILL — ZOLPIDEM TARTRATE 10 MG TAB: 10 | 90 days supply | Qty: 90 | Fill #0

## 2018-09-14 MED FILL — TRINTELLIX 20 MG TABLET: 20 | 90 days supply | Qty: 90 | Fill #0

## 2018-09-27 MED FILL — PROGESTERONE 100 MG CAPSULE: 100 | 30 days supply | Qty: 30 | Fill #1

## 2018-09-27 MED FILL — DOTTI 0.1 MG/24HR PTTW: 0.1 | 28 days supply | Qty: 8 | Fill #1

## 2018-10-25 MED FILL — FLUCONAZOLE 150 MG TABS: 150 | 15 days supply | Qty: 5 | Fill #5

## 2018-10-25 MED FILL — NYSTATIN 100,000 UNIT/GM CR: 100000 | 20 days supply | Qty: 60 | Fill #1

## 2018-10-25 MED FILL — DOTTI 0.1 MG/24HR PTTW: 0.1 | 28 days supply | Qty: 8 | Fill #2

## 2018-10-25 MED FILL — PROGESTERONE 100 MG CAPSULE: 100 | 30 days supply | Qty: 30 | Fill #2

## 2018-11-02 ENCOUNTER — Telehealth: Payer: 59 | Admitting: Physician Assistant

## 2018-11-02 DIAGNOSIS — N3001 Acute cystitis with hematuria: Secondary | ICD-10-CM | POA: Diagnosis not present

## 2018-11-02 MED ORDER — NITROFURANTOIN MONOHYD MACRO 100 MG PO CAPS: 100.0000 mg | ORAL_CAPSULE | Freq: Two times a day (BID) | ORAL | 0 refills | Status: AC

## 2018-11-02 MED FILL — NITROFURANTOIN MONO-MCR 100: 100 | 5 days supply | Qty: 10 | Fill #0

## 2018-11-02 NOTE — Progress Notes (Signed)

## 2018-11-22 DIAGNOSIS — M19072 Primary osteoarthritis, left ankle and foot: Secondary | ICD-10-CM | POA: Diagnosis not present

## 2018-11-22 DIAGNOSIS — M79672 Pain in left foot: Secondary | ICD-10-CM | POA: Diagnosis not present

## 2018-11-22 DIAGNOSIS — M25512 Pain in left shoulder: Secondary | ICD-10-CM | POA: Diagnosis not present

## 2018-11-24 ENCOUNTER — Other Ambulatory Visit (HOSPITAL_COMMUNITY): Payer: Self-pay | Admitting: Orthopedic Surgery

## 2018-12-01 MED FILL — DOTTI 0.1 MG/24HR PTTW: 0.1 | 28 days supply | Qty: 8 | Fill #3

## 2018-12-01 MED FILL — PROGESTERONE 100 MG CAPSULE: 100 | 30 days supply | Qty: 30 | Fill #3

## 2018-12-09 DIAGNOSIS — M7502 Adhesive capsulitis of left shoulder: Secondary | ICD-10-CM | POA: Diagnosis not present

## 2018-12-09 MED FILL — TRINTELLIX 20 MG TABLET: 20 | 90 days supply | Qty: 90 | Fill #1

## 2018-12-10 MED FILL — ZOLPIDEM TARTRATE 10 MG TAB: 10 | 90 days supply | Qty: 90 | Fill #1

## 2018-12-16 DIAGNOSIS — M7502 Adhesive capsulitis of left shoulder: Secondary | ICD-10-CM | POA: Diagnosis not present

## 2018-12-17 DIAGNOSIS — J310 Chronic rhinitis: Secondary | ICD-10-CM | POA: Diagnosis not present

## 2018-12-17 DIAGNOSIS — F3341 Major depressive disorder, recurrent, in partial remission: Secondary | ICD-10-CM | POA: Diagnosis not present

## 2018-12-17 MED FILL — IPRATROPIUM 0.06% SPRAY: 0.06 | 30 days supply | Qty: 15 | Fill #0

## 2018-12-17 MED FILL — TRINTELLIX 10 MG TABLET: 10 | 90 days supply | Qty: 90 | Fill #0

## 2018-12-27 MED FILL — DOTTI 0.1 MG/24HR PTTW: 0.1 | 84 days supply | Qty: 24 | Fill #4

## 2018-12-27 MED FILL — PROGESTERONE 100 MG CAPSULE: 100 | 90 days supply | Qty: 90 | Fill #4

## 2018-12-29 MED FILL — IPRATROPIUM 0.06% SPRAY: 0.06 | 30 days supply | Qty: 15 | Fill #1

## 2019-01-08 MED FILL — FLUCONAZOLE 150 MG TABS: 150 | 15 days supply | Qty: 5 | Fill #6

## 2019-01-23 MED FILL — FLUCONAZOLE 150 MG TABS: 150 | 15 days supply | Qty: 5 | Fill #7

## 2019-01-27 ENCOUNTER — Encounter (HOSPITAL_BASED_OUTPATIENT_CLINIC_OR_DEPARTMENT_OTHER): Payer: Self-pay

## 2019-01-27 ENCOUNTER — Ambulatory Visit (HOSPITAL_BASED_OUTPATIENT_CLINIC_OR_DEPARTMENT_OTHER): Admit: 2019-01-27 | Payer: 59 | Admitting: Orthopedic Surgery

## 2019-01-27 SURGERY — FUSION, JOINT, FOOT
Anesthesia: General | Laterality: Left

## 2019-02-15 MED FILL — FLUCONAZOLE 150 MG TABS: 150 | 15 days supply | Qty: 5 | Fill #8

## 2019-02-17 ENCOUNTER — Other Ambulatory Visit (HOSPITAL_COMMUNITY): Payer: Self-pay | Admitting: Orthopedic Surgery

## 2019-02-18 ENCOUNTER — Encounter (HOSPITAL_BASED_OUTPATIENT_CLINIC_OR_DEPARTMENT_OTHER): Payer: Self-pay | Admitting: *Deleted

## 2019-02-18 ENCOUNTER — Other Ambulatory Visit: Payer: Self-pay

## 2019-02-23 ENCOUNTER — Other Ambulatory Visit: Payer: Self-pay

## 2019-02-23 ENCOUNTER — Other Ambulatory Visit (HOSPITAL_COMMUNITY)
Admission: RE | Admit: 2019-02-23 | Discharge: 2019-02-23 | Disposition: A | Discharge status: Home / Self Care | Payer: 59 | Source: Ambulatory Visit | Attending: Orthopedic Surgery | Admitting: Orthopedic Surgery

## 2019-02-23 DIAGNOSIS — Z1159 Encounter for screening for other viral diseases: Secondary | ICD-10-CM | POA: Insufficient documentation

## 2019-02-23 LAB — SARS CORONAVIRUS 2 BY RT PCR (HOSPITAL ORDER, PERFORMED IN ~~LOC~~ HOSPITAL LAB): SARS Coronavirus 2: NEGATIVE

## 2019-02-24 ENCOUNTER — Ambulatory Visit (HOSPITAL_BASED_OUTPATIENT_CLINIC_OR_DEPARTMENT_OTHER)
Admission: RE | Admit: 2019-02-24 | Discharge: 2019-02-24 | Disposition: A | Discharge status: Home / Self Care | Payer: 59 | Attending: Orthopedic Surgery | Admitting: Orthopedic Surgery

## 2019-02-24 ENCOUNTER — Encounter (HOSPITAL_BASED_OUTPATIENT_CLINIC_OR_DEPARTMENT_OTHER)
Admission: RE | Disposition: A | Discharge status: Home / Self Care | Payer: Self-pay | Source: Home / Self Care | Attending: Orthopedic Surgery

## 2019-02-24 ENCOUNTER — Ambulatory Visit (HOSPITAL_BASED_OUTPATIENT_CLINIC_OR_DEPARTMENT_OTHER): Payer: 59 | Admitting: Certified Registered"

## 2019-02-24 ENCOUNTER — Other Ambulatory Visit: Payer: Self-pay

## 2019-02-24 ENCOUNTER — Encounter (HOSPITAL_BASED_OUTPATIENT_CLINIC_OR_DEPARTMENT_OTHER): Payer: Self-pay | Admitting: *Deleted

## 2019-02-24 DIAGNOSIS — M65872 Other synovitis and tenosynovitis, left ankle and foot: Secondary | ICD-10-CM | POA: Insufficient documentation

## 2019-02-24 DIAGNOSIS — F419 Anxiety disorder, unspecified: Secondary | ICD-10-CM | POA: Diagnosis not present

## 2019-02-24 DIAGNOSIS — M19079 Primary osteoarthritis, unspecified ankle and foot: Secondary | ICD-10-CM

## 2019-02-24 DIAGNOSIS — M67472 Ganglion, left ankle and foot: Secondary | ICD-10-CM | POA: Diagnosis not present

## 2019-02-24 DIAGNOSIS — M25775 Osteophyte, left foot: Secondary | ICD-10-CM | POA: Insufficient documentation

## 2019-02-24 DIAGNOSIS — Z79899 Other long term (current) drug therapy: Secondary | ICD-10-CM | POA: Diagnosis not present

## 2019-02-24 DIAGNOSIS — Z87891 Personal history of nicotine dependence: Secondary | ICD-10-CM | POA: Diagnosis not present

## 2019-02-24 DIAGNOSIS — D680 Von Willebrand's disease: Secondary | ICD-10-CM | POA: Insufficient documentation

## 2019-02-24 DIAGNOSIS — M659 Synovitis and tenosynovitis, unspecified: Secondary | ICD-10-CM | POA: Diagnosis not present

## 2019-02-24 DIAGNOSIS — G43909 Migraine, unspecified, not intractable, without status migrainosus: Secondary | ICD-10-CM | POA: Insufficient documentation

## 2019-02-24 DIAGNOSIS — Z1159 Encounter for screening for other viral diseases: Secondary | ICD-10-CM | POA: Insufficient documentation

## 2019-02-24 DIAGNOSIS — K589 Irritable bowel syndrome without diarrhea: Secondary | ICD-10-CM | POA: Diagnosis not present

## 2019-02-24 DIAGNOSIS — M19072 Primary osteoarthritis, left ankle and foot: Secondary | ICD-10-CM | POA: Diagnosis not present

## 2019-02-24 HISTORY — DX: Other specified postprocedural states: Z98.890

## 2019-02-24 HISTORY — DX: Other complications of anesthesia, initial encounter: T88.59XA

## 2019-02-24 HISTORY — PX: BONE EXOSTOSIS EXCISION: SHX1249

## 2019-02-24 HISTORY — DX: Nausea with vomiting, unspecified: R11.2

## 2019-02-24 HISTORY — DX: Primary osteoarthritis, unspecified ankle and foot: M19.079

## 2019-02-24 HISTORY — DX: Adverse effect of unspecified anesthetic, initial encounter: T41.45XA

## 2019-02-24 SURGERY — EXCISION, EXOSTOSIS
Anesthesia: Regional | Site: Foot | Laterality: Left

## 2019-02-24 MED ORDER — FENTANYL CITRATE (PF) 100 MCG/2ML IJ SOLN
50.0000 ug | INTRAMUSCULAR | Status: DC | PRN
  Administered 2019-02-24: 50 ug via INTRAVENOUS
  Administered 2019-02-24: 100 ug via INTRAVENOUS

## 2019-02-24 MED ORDER — LACTATED RINGERS IV SOLN
INTRAVENOUS | Status: DC
  Administered 2019-02-24: 09:00:00 via INTRAVENOUS

## 2019-02-24 MED ORDER — CEFAZOLIN SODIUM-DEXTROSE 2-4 GM/100ML-% IV SOLN
2.0000 g | INTRAVENOUS | Status: AC
  Administered 2019-02-24: 2 g via INTRAVENOUS

## 2019-02-24 MED ORDER — ONDANSETRON HCL 4 MG PO TABS: 4.0000 mg | ORAL_TABLET | Freq: Every day | ORAL | 0 refills | Status: DC | PRN

## 2019-02-24 MED ORDER — ROPIVACAINE HCL 5 MG/ML IJ SOLN
INTRAMUSCULAR | Status: DC | PRN
  Administered 2019-02-24 (×2): 20 mL via PERINEURAL

## 2019-02-24 MED ORDER — BUPIVACAINE-EPINEPHRINE (PF) 0.5% -1:200000 IJ SOLN
INTRAMUSCULAR | Status: AC
  Filled 2019-02-24: qty 30

## 2019-02-24 MED ORDER — CHLORHEXIDINE GLUCONATE 4 % EX LIQD: 60.0000 mL | Freq: Once | CUTANEOUS | Status: DC

## 2019-02-24 MED ORDER — ONDANSETRON HCL 4 MG/2ML IJ SOLN
INTRAMUSCULAR | Status: AC
  Filled 2019-02-24: qty 2

## 2019-02-24 MED ORDER — 0.9 % SODIUM CHLORIDE (POUR BTL) OPTIME
TOPICAL | Status: DC | PRN
  Administered 2019-02-24: 120 mL

## 2019-02-24 MED ORDER — FENTANYL CITRATE (PF) 100 MCG/2ML IJ SOLN: 25.0000 ug | INTRAMUSCULAR | Status: DC | PRN

## 2019-02-24 MED ORDER — LIDOCAINE 2% (20 MG/ML) 5 ML SYRINGE
INTRAMUSCULAR | Status: DC | PRN
  Administered 2019-02-24: 40 mg via INTRAVENOUS

## 2019-02-24 MED ORDER — CEFAZOLIN SODIUM-DEXTROSE 2-4 GM/100ML-% IV SOLN
INTRAVENOUS | Status: AC
  Filled 2019-02-24: qty 100

## 2019-02-24 MED ORDER — SCOPOLAMINE 1 MG/3DAYS TD PT72: 1.0000 | MEDICATED_PATCH | Freq: Once | TRANSDERMAL | Status: DC | PRN

## 2019-02-24 MED ORDER — MIDAZOLAM HCL 2 MG/2ML IJ SOLN
INTRAMUSCULAR | Status: AC
  Filled 2019-02-24: qty 2

## 2019-02-24 MED ORDER — PROPOFOL 10 MG/ML IV BOLUS
INTRAVENOUS | Status: AC
  Filled 2019-02-24: qty 20

## 2019-02-24 MED ORDER — DEXAMETHASONE SODIUM PHOSPHATE 4 MG/ML IJ SOLN
INTRAMUSCULAR | Status: DC | PRN
  Administered 2019-02-24: 5 mg via INTRAVENOUS

## 2019-02-24 MED ORDER — SENNA 8.6 MG PO TABS: 2.0000 | ORAL_TABLET | Freq: Two times a day (BID) | ORAL | 0 refills | Status: DC

## 2019-02-24 MED ORDER — LIDOCAINE 2% (20 MG/ML) 5 ML SYRINGE
INTRAMUSCULAR | Status: AC
  Filled 2019-02-24: qty 5

## 2019-02-24 MED ORDER — ONDANSETRON HCL 4 MG/2ML IJ SOLN
INTRAMUSCULAR | Status: DC | PRN
  Administered 2019-02-24: 4 mg via INTRAVENOUS

## 2019-02-24 MED ORDER — MIDAZOLAM HCL 2 MG/2ML IJ SOLN
1.0000 mg | INTRAMUSCULAR | Status: DC | PRN
  Administered 2019-02-24: 2 mg via INTRAVENOUS

## 2019-02-24 MED ORDER — PROPOFOL 500 MG/50ML IV EMUL
INTRAVENOUS | Status: DC | PRN
  Administered 2019-02-24: 50 ug/kg/min via INTRAVENOUS

## 2019-02-24 MED ORDER — METOCLOPRAMIDE HCL 5 MG/ML IJ SOLN: 10.0000 mg | Freq: Once | INTRAMUSCULAR | Status: DC | PRN

## 2019-02-24 MED ORDER — MEPERIDINE HCL 25 MG/ML IJ SOLN: 6.2500 mg | INTRAMUSCULAR | Status: DC | PRN

## 2019-02-24 MED ORDER — LACTATED RINGERS IV SOLN: INTRAVENOUS | Status: DC

## 2019-02-24 MED ORDER — DOCUSATE SODIUM 100 MG PO CAPS: 100.0000 mg | ORAL_CAPSULE | Freq: Two times a day (BID) | ORAL | 0 refills | Status: DC

## 2019-02-24 MED ORDER — FENTANYL CITRATE (PF) 100 MCG/2ML IJ SOLN
INTRAMUSCULAR | Status: AC
  Filled 2019-02-24: qty 2

## 2019-02-24 MED ORDER — HYDROCODONE-ACETAMINOPHEN 5-325 MG PO TABS: 1.0000 | ORAL_TABLET | Freq: Four times a day (QID) | ORAL | 0 refills | Status: AC | PRN

## 2019-02-24 MED ORDER — SODIUM CHLORIDE 0.9 % IV SOLN: INTRAVENOUS | Status: DC

## 2019-02-24 MED ORDER — CLONIDINE HCL (ANALGESIA) 100 MCG/ML EP SOLN
EPIDURAL | Status: DC | PRN
  Administered 2019-02-24 (×2): 50 ug

## 2019-02-24 MED ORDER — DEXAMETHASONE SODIUM PHOSPHATE 10 MG/ML IJ SOLN
INTRAMUSCULAR | Status: AC
  Filled 2019-02-24: qty 1

## 2019-02-24 MED FILL — HYDROCODON-APAP 5-325: 5-325 | 3 days supply | Qty: 5 | Fill #0

## 2019-02-24 MED FILL — ONDANSETRON HCL 4 MG TABLET: 4 | 30 days supply | Qty: 30 | Fill #0

## 2019-02-24 MED FILL — FLUCONAZOLE 150 MG TABS: 150 | 15 days supply | Qty: 5 | Fill #9

## 2019-02-24 SURGICAL SUPPLY — 67 items
BANDAGE ACE 4X5 VEL STRL LF (GAUZE/BANDAGES/DRESSINGS) ×3 IMPLANT
BANDAGE ESMARK 6X9 LF (GAUZE/BANDAGES/DRESSINGS) IMPLANT
BIT TREPHINE CORING 8 (BIT) IMPLANT
BLADE AVERAGE 25X9 (BLADE) IMPLANT
BLADE MICRO SAGITTAL (BLADE) IMPLANT
BLADE SURG 15 STRL LF DISP TIS (BLADE) ×4 IMPLANT
BLADE SURG 15 STRL SS (BLADE) ×2
BNDG COHESIVE 4X5 TAN STRL (GAUZE/BANDAGES/DRESSINGS) IMPLANT
BNDG COHESIVE 6X5 TAN STRL LF (GAUZE/BANDAGES/DRESSINGS) IMPLANT
BNDG ESMARK 6X9 LF (GAUZE/BANDAGES/DRESSINGS)
CHLORAPREP W/TINT 26 (MISCELLANEOUS) ×3 IMPLANT
COVER BACK TABLE REUSABLE LG (DRAPES) ×3 IMPLANT
COVER WAND RF STERILE (DRAPES) IMPLANT
CUFF TOURN SGL QUICK 24 (TOURNIQUET CUFF) ×1
CUFF TOURN SGL QUICK 34 (TOURNIQUET CUFF)
CUFF TRNQT CYL 24X4X16.5-23 (TOURNIQUET CUFF) ×2 IMPLANT
CUFF TRNQT CYL 34X4.125X (TOURNIQUET CUFF) IMPLANT
DRAPE EXTREMITY T 121X128X90 (DISPOSABLE) ×3 IMPLANT
DRAPE OEC MINIVIEW 54X84 (DRAPES) ×3 IMPLANT
DRAPE U-SHAPE 47X51 STRL (DRAPES) ×3 IMPLANT
DRSG MEPITEL 4X7.2 (GAUZE/BANDAGES/DRESSINGS) ×3 IMPLANT
DRSG PAD ABDOMINAL 8X10 ST (GAUZE/BANDAGES/DRESSINGS) ×6 IMPLANT
ELECT REM PT RETURN 9FT ADLT (ELECTROSURGICAL) ×3
ELECTRODE REM PT RTRN 9FT ADLT (ELECTROSURGICAL) ×2 IMPLANT
GAUZE SPONGE 4X4 12PLY STRL (GAUZE/BANDAGES/DRESSINGS) ×3 IMPLANT
GLOVE BIO SURGEON STRL SZ 6.5 (GLOVE) ×3 IMPLANT
GLOVE BIO SURGEON STRL SZ8 (GLOVE) ×3 IMPLANT
GLOVE BIOGEL PI IND STRL 7.0 (GLOVE) ×2 IMPLANT
GLOVE BIOGEL PI IND STRL 8 (GLOVE) ×4 IMPLANT
GLOVE BIOGEL PI INDICATOR 7.0 (GLOVE) ×1
GLOVE BIOGEL PI INDICATOR 8 (GLOVE) ×2
GLOVE ECLIPSE 8.0 STRL XLNG CF (GLOVE) ×3 IMPLANT
GOWN STRL REUS W/ TWL LRG LVL3 (GOWN DISPOSABLE) ×2 IMPLANT
GOWN STRL REUS W/ TWL XL LVL3 (GOWN DISPOSABLE) ×4 IMPLANT
GOWN STRL REUS W/TWL LRG LVL3 (GOWN DISPOSABLE) ×1
GOWN STRL REUS W/TWL XL LVL3 (GOWN DISPOSABLE) ×2
K-WIRE 9  SMOOTH .062 (WIRE) IMPLANT
KIT ARCUS SIZING TEMPLATE STRL (KITS) IMPLANT
NDL SAFETY ECLIPSE 18X1.5 (NEEDLE) IMPLANT
NEEDLE HYPO 18GX1.5 SHARP (NEEDLE)
NEEDLE HYPO 22GX1.5 SAFETY (NEEDLE) ×3 IMPLANT
PACK BASIN DAY SURGERY FS (CUSTOM PROCEDURE TRAY) ×3 IMPLANT
PAD CAST 4YDX4 CTTN HI CHSV (CAST SUPPLIES) ×2 IMPLANT
PADDING CAST COTTON 4X4 STRL (CAST SUPPLIES) ×1
PADDING CAST COTTON 6X4 STRL (CAST SUPPLIES) IMPLANT
PENCIL BUTTON HOLSTER BLD 10FT (ELECTRODE) ×3 IMPLANT
SANITIZER HAND PURELL 535ML FO (MISCELLANEOUS) ×3 IMPLANT
SHEET MEDIUM DRAPE 40X70 STRL (DRAPES) ×3 IMPLANT
SLEEVE SCD COMPRESS KNEE MED (MISCELLANEOUS) ×3 IMPLANT
SPLINT FAST PLASTER 5X30 (CAST SUPPLIES)
SPLINT PLASTER CAST FAST 5X30 (CAST SUPPLIES) IMPLANT
SPONGE LAP 18X18 RF (DISPOSABLE) ×3 IMPLANT
SPONGE SURGIFOAM ABS GEL 12-7 (HEMOSTASIS) IMPLANT
STOCKINETTE 6  STRL (DRAPES) ×1
STOCKINETTE 6 STRL (DRAPES) ×2 IMPLANT
SUCTION FRAZIER HANDLE 10FR (MISCELLANEOUS) ×1
SUCTION TUBE FRAZIER 10FR DISP (MISCELLANEOUS) ×2 IMPLANT
SUT BONE WAX W31G (SUTURE) ×3 IMPLANT
SUT ETHILON 3 0 PS 1 (SUTURE) ×3 IMPLANT
SUT MNCRL AB 3-0 PS2 18 (SUTURE) ×3 IMPLANT
SUT VIC AB 0 SH 27 (SUTURE) IMPLANT
SUT VIC AB 2-0 SH 27 (SUTURE)
SUT VIC AB 2-0 SH 27XBRD (SUTURE) IMPLANT
SYR BULB 3OZ (MISCELLANEOUS) ×3 IMPLANT
TOWEL GREEN STERILE FF (TOWEL DISPOSABLE) ×6 IMPLANT
TUBE CONNECTING 20X1/4 (TUBING) ×3 IMPLANT
UNDERPAD 30X30 (UNDERPADS AND DIAPERS) ×3 IMPLANT

## 2019-02-24 NOTE — H&P (Signed)
[name] is an 58 y.o. female.   Chief Complaint: left foot pain HPI:  The patient is a 58 year old female with a past medical history significant for von Willebrand's disease.  She has a long history of left dorsal midfoot pain with osteophytes over the first tarsometatarsal joint at the medial cuneiform and first metatarsal base.  She has failed nonoperative treatment to date including activity modification, oral anti-inflammatories and shoewear modification.  She presents now for operative treatment of this painful condition.  Past Medical History:  Diagnosis Date  . Anxiety   . Back pain   . Complication of anesthesia   . IBS (irritable bowel syndrome)   . Migraine   . Osteoarthritis, foot, localized    left  . PONV (postoperative nausea and vomiting)   . Von Willebrand's disease ([place])    bruises easily    Past Surgical History:  Procedure Laterality Date  . CESAREAN SECTION    . laparoscopy removal of right ovarian cyst    . left knee arthroscopy    . NOVASURE ABLATION  12/2005  . pylonidal cyst removal    . SEPTOPLASTY      Family History  Problem Relation Age of Onset  . Hypertension Mother   . Arthritis Mother   . Hypertension Father   . Heart disease Father   . Lupus Sister   . Other Sister        mythensia gravis   Social History:  reports that she has quit smoking. Her smoking use included cigarettes. She quit after 10.00 years of use. She has never used smokeless tobacco. She reports current alcohol use of about 7.0 standard drinks of alcohol per week. She reports that she does not use drugs.  Allergies:  Allergies  Allergen Reactions  . Levaquin [Levofloxacin In D5w] Other (See Comments)    Tendonitis in shoulder, severe yeast infections  . Sulfa Antibiotics Other (See Comments)    Unknown reaction - childhood allergy    Medications Prior to Admission  Medication Sig Dispense Refill  . ibuprofen (ADVIL,MOTRIN) 200 MG tablet Take 600-800 mg by  mouth every 6 (six) hours as needed for moderate pain.    . Probiotic Product (PROBIOTIC PO) Take by mouth.    . progesterone (PROMETRIUM) 100 MG capsule Take 1 capsule (100 mg total) by mouth at bedtime. 30 capsule 11  . VIVELLE-DOT 0.1 MG/24HR patch Place 1 patch (0.1 mg total) onto the skin 2 (two) times a week. 8 patch 12  . vortioxetine HBr (TRINTELLIX) 20 MG TABS tablet Take 20 mg by mouth daily.    Marland Kitchen zolpidem (AMBIEN) 10 MG tablet Take 1 tablet (10 mg total) by mouth at bedtime as needed for sleep. 90 tablet 4    Results for orders placed or performed during the hospital encounter of 02/23/19 (from the past 48 hour(s))  SARS Coronavirus 2 (CEPHEID - Performed in [facility] hospital lab), Hosp Order     Status: None   Collection Time: 02/23/19  2:37 PM  Result Value Ref Range   SARS Coronavirus 2 NEGATIVE NEGATIVE    Comment: (NOTE) If result is NEGATIVE SARS-CoV-2 target nucleic acids are NOT DETECTED. The SARS-CoV-2 RNA is generally detectable in upper and lower  respiratory specimens during the acute phase of infection. The lowest  concentration of SARS-CoV-2 viral copies this assay can detect is 250  copies / mL. A negative result does not preclude SARS-CoV-2 infection  and should not be used as the sole  basis for treatment or other  patient management decisions.  A negative result may occur with  improper specimen collection / handling, submission of specimen other  than nasopharyngeal swab, presence of viral mutation(s) within the  areas targeted by this assay, and inadequate number of viral copies  (<250 copies / mL). A negative result must be combined with clinical  observations, patient history, and epidemiological information. If result is POSITIVE SARS-CoV-2 target nucleic acids are DETECTED. The SARS-CoV-2 RNA is generally detectable in upper and lower  respiratory specimens dur ing the acute phase of infection.  Positive  results are indicative of active infection  with SARS-CoV-2.  Clinical  correlation with patient history and other diagnostic information is  necessary to determine patient infection status.  Positive results do  not rule out bacterial infection or co-infection with other viruses. If result is PRESUMPTIVE POSTIVE SARS-CoV-2 nucleic acids MAY BE PRESENT.   A presumptive positive result was obtained on the submitted specimen  and confirmed on repeat testing.  While 2019 novel coronavirus  (SARS-CoV-2) nucleic acids may be present in the submitted sample  additional confirmatory testing may be necessary for epidemiological  and / or clinical management purposes  to differentiate between  SARS-CoV-2 and other Sarbecovirus currently known to infect humans.  If clinically indicated additional testing with an alternate test  methodology [phone]) is advised. The SARS-CoV-2 RNA is generally  detectable in upper and lower respiratory sp ecimens during the acute  phase of infection. The expected result is Negative. Fact Sheet for Patients:  StrictlyIdeas.no Fact Sheet for Healthcare Providers: BankingDealers.co.za This test is not yet approved or cleared by the [place] FDA and has been authorized for detection and/or diagnosis of SARS-CoV-2 by FDA under an Emergency Use Authorization (EUA).  This EUA will remain in effect (meaning this test can be used) for the duration of the COVID-19 declaration under Section 564(b)(1) of the Act, 21 U.S.C. section 360bbb-3(b)(1), unless the authorization is terminated or revoked sooner. Performed at [facility], [place] [address] [place], [place] [address]    No results found.  ROS no recent fever, chills, nausea, vomiting or changes in her appetite.  No recent bleeding problems.  Blood pressure 125/79, pulse 72, temperature 97.6 F (36.4 C), temperature source Oral, resp. rate 15, height 5\' 8"  (1.727 m), weight 82.8 kg, last  menstrual period 11/14/2012, SpO2 100 %. Physical Exam  Well-nourished well-developed woman in no apparent distress.  Alert and oriented x4.  Mood and affect are normal.  Extraocular motions are intact.  Respirations are unlabored.  Gait is normal.  The left foot has swelling over the first TMT joint as well as tender bony prominences.  No lymphadenopathy.  Sensibility to light touch is intact over the dorsal midfoot.  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  Today we discussed the treatment options again in detail.  At this point the patient declines the option of first TMT joint arthrodesis.  The risks and benefits of the alternative treatment options have been discussed in detail.  The patient wishes to proceed with surgery and specifically understands risks of bleeding, infection, nerve damage, blood clots, need for additional surgery, amputation and death.   Wylene Simmer, MD Mar 07, 2019, 9:23 AM

## 2019-02-24 NOTE — Progress Notes (Signed)
Assisted Dr. Marcell Barlow with left, ultrasound guided, popliteal/saphenous block. Side rails up, monitors on throughout procedure. See vital signs in flow sheet. Tolerated Procedure well.

## 2019-02-24 NOTE — Anesthesia Postprocedure Evaluation (Signed)
Anesthesia Post Note  Patient: [name]  Procedure(s) Performed: Dorsal Exostectomy (Left Foot)     Patient location during evaluation: PACU Anesthesia Type: Regional Level of consciousness: awake and alert Pain management: pain level controlled Vital Signs Assessment: post-procedure vital signs reviewed and stable Respiratory status: spontaneous breathing, nonlabored ventilation, respiratory function stable and patient connected to nasal cannula oxygen Cardiovascular status: stable and blood pressure returned to baseline Postop Assessment: no apparent nausea or vomiting Anesthetic complications: no    Last Vitals:  Vitals:   02/24/19 1100 02/24/19 1115  BP: 110/74 119/68  Pulse: 64 62  Resp: (!) 23 18  Temp:  36.4 C  SpO2: 100% 100%    Last Pain:  Vitals:   02/24/19 1115  TempSrc:   PainSc: 0-No pain                 Montez Hageman

## 2019-02-24 NOTE — Anesthesia Preprocedure Evaluation (Signed)
Anesthesia Evaluation  Patient identified by MRN, date of birth, ID band Patient awake    Reviewed: Allergy & Precautions, NPO status , Patient's Chart, lab work & pertinent test results  History of Anesthesia Complications (+) PONV  Airway Mallampati: II  TM Distance: >3 FB Neck ROM: Full    Dental no notable dental hx.    Pulmonary neg pulmonary ROS, former smoker,    Pulmonary exam normal breath sounds clear to auscultation       Cardiovascular negative cardio ROS Normal cardiovascular exam Rhythm:Regular Rate:Normal     Neuro/Psych negative neurological ROS  negative psych ROS   GI/Hepatic negative GI ROS, Neg liver ROS,   Endo/Other  negative endocrine ROS  Renal/GU negative Renal ROS  negative genitourinary   Musculoskeletal negative musculoskeletal ROS (+)   Abdominal   Peds negative pediatric ROS (+)  Hematology  (+) Blood dyscrasia (von Willenbrand), ,   Anesthesia Other Findings   Reproductive/Obstetrics negative OB ROS                             Anesthesia Physical Anesthesia Plan  ASA: II  Anesthesia Plan: Regional   Post-op Pain Management:    Induction: Intravenous  PONV Risk Score and Plan: 3 and Ondansetron and Treatment may vary due to age or medical condition  Airway Management Planned: Simple Face Mask  Additional Equipment:   Intra-op Plan:   Post-operative Plan:   Informed Consent: I have reviewed the patients History and Physical, chart, labs and discussed the procedure including the risks, benefits and alternatives for the proposed anesthesia with the patient or authorized representative who has indicated his/her understanding and acceptance.     Dental advisory given  Plan Discussed with: CRNA  Anesthesia Plan Comments:         Anesthesia Quick Evaluation

## 2019-02-24 NOTE — Op Note (Signed)
02/24/2019  10:45 AM  PATIENT:  [name]  58 y.o. female  PRE-OPERATIVE DIAGNOSIS:   1.  Left foot first metatarsal and medial cuneiform dorsal osteophytes  POST-OPERATIVE DIAGNOSIS:   1.  Left foot first metatarsal and medial cuneiform dorsal osteophytes 2.  Left extensor pollicis longus tenosynovitis 3.  Left dorsal foot ganglion cyst  Procedure(s): 1.  Exostectomy of the left first metatarsal base 2.  Exostectomy of the left medial cuneiform 3.  Tenolysis of the extensor hallucis longus tendon 4.  Excision of left dorsal foot ganglion cyst measuring 1 cm x 1 cm  SURGEON:  Wylene Simmer, MD  ASSISTANT: Mechele Claude, PA-C  ANESTHESIA:   Regional, mac  EBL:  minimal   TOURNIQUET:   Total Tourniquet Time Documented: Calf (Left) - 17 minutes Total: Calf (Left) - 17 minutes  COMPLICATIONS:  None apparent  DISPOSITION:  Extubated, awake and stable to recovery.  INDICATION FOR PROCEDURE: The patient is a 58 year old female with a past medical history significant for von Willebrand disease.  She has a long history of dorsal left foot pain over the first TMT joint.  Radiographs indicate dorsal osteophytes and early degenerative changes at the first TMT joint.  She presents now for operative treatment of this painful and limiting condition.The risks and benefits of the alternative treatment options have been discussed in detail.  The patient wishes to proceed with surgery and specifically understands risks of bleeding, infection, nerve damage, blood clots, need for additional surgery, amputation and death.  PROCEDURE IN DETAIL:  After pre operative consent was obtained, and the correct operative site was identified, the patient was brought to the operating room and placed supine on the OR table.  Anesthesia was administered.  Pre-operative antibiotics were administered.  A surgical timeout was taken.  The left lower extremity was prepped and draped in standard sterile fashion with  a tourniquet around the calf.  The extremity was exsanguinated and the tourniquet was inflated to 200 mmHg.  A longitudinal incision was then made over the first tarsometatarsal joint.  Dissection was carried down through the skin and subcutaneous tissue.  The extensor houses longus tendon was identified.  It was mobilized and retracted exposing the dorsal aspect of the joint.  The joint capsule was incised and elevated medially and laterally.  The joint was distracted and a freer elevator was used to probe the joint.  There was no significant arthritis over the plantar three fourths of the joint.  There was slight degenerative changes at the dorsal aspect of the joint with dorsal osteophytes.  Osteophytes were then resected from the first metatarsal base and medial cuneiform with a rondure.  There was still swelling evident lateral to the EHL.  The area of swelling was explored.  There was some tenosynovitis noted along the EHL.  This was resected with tenotomy scissors.  Just lateral to the EHL adjacent to the neurovascular bundle, a ganglion cyst was identified.  This measured approximately 1 cm x 1 cm.  It was dissected free from the artery taking care to protect the vessel.  The ganglion cyst was excised in its entirety.  The wound was then copiously irrigated.  The tourniquet was released.  Bone wax was placed over the cut surface of bone.  Hemostasis was achieved.  Subcutaneous tissues were approximated with 3-0 Monocryl.  The skin incision was closed with a running 3-0 nylon.  Sterile dressings were applied followed by a compression wrap.  The patient was awakened from  anesthesia and transported to the recovery room in stable condition.   FOLLOW UP PLAN: Weightbearing as tolerated in a flat postop shoe.  No indication for DVT prophylaxis in this ambulatory patient.  Follow-up in 2 weeks for suture removal.:    Mechele Claude PA-C was present and scrubbed for the duration of the operative case. His  assistance was essential in positioning the patient, prepping and draping, gaining and maintaining exposure, performing the operation, closing and dressing the wounds and applying the splint.

## 2019-02-24 NOTE — Anesthesia Procedure Notes (Signed)
Anesthesia Regional Block: Popliteal block   Pre-Anesthetic Checklist: ,, timeout performed, Correct Patient, Correct Site, Correct Laterality, Correct Procedure, Correct Position, site marked, Risks and benefits discussed, Surgical consent,  Pre-op evaluation,  At surgeon's request  Laterality: Left and Lower  Prep: Maximum Sterile Barrier Precautions used, chloraprep       Needles:  Injection technique: Single-shot  Needle Type: Echogenic Stimulator Needle     Needle Length: 10cm      Additional Needles:   Procedures:,,,, ultrasound used (permanent image in chart),,,,  Narrative:  Start time: 02/24/2019 9:16 AM End time: 02/24/2019 9:20 AM Injection made incrementally with aspirations every 5 mL.  Performed by: Personally  Anesthesiologist: Montez Hageman, MD  Additional Notes: Risks, benefits and alternative to block explained extensively.  Patient tolerated procedure well, without complications.

## 2019-02-24 NOTE — Discharge Instructions (Addendum)
Brooke Simmer, MD [place]  Please read the following information regarding your care after surgery.  Medications  You only need a prescription for the narcotic pain medicine (ex. oxycodone, Percocet, Norco).  All of the other medicines listed below are available over the counter. X Ibuprofen that you have at home for the first 3 days after surgery. X hydrocodone as prescribed for severe pain  Narcotic pain medicine (ex. oxycodone, Percocet, Vicodin) will cause constipation.  To prevent this problem, take the following medicines while you are taking any pain medicine. X docusate sodium (Colace) 100 mg twice a day X senna (Senokot) 2 tablets twice a day    Post Anesthesia Home Care Instructions  Activity: Get plenty of rest for the remainder of the day. A responsible individual must stay with you for 24 hours following the procedure.  For the next 24 hours, DO NOT: -Drive a car -Paediatric nurse -Drink alcoholic beverages -Take any medication unless instructed by your physician -Make any legal decisions or sign important papers.  Meals: Start with liquid foods such as gelatin or soup. Progress to regular foods as tolerated. Avoid greasy, spicy, heavy foods. If nausea and/or vomiting occur, drink only clear liquids until the nausea and/or vomiting subsides. Call your physician if vomiting continues.  Special Instructions/Symptoms: Your throat may feel dry or sore from the anesthesia or the breathing tube placed in your throat during surgery. If this causes discomfort, gargle with warm salt water. The discomfort should disappear within 24 hours.  If you had a scopolamine patch placed behind your ear for the management of post- operative nausea and/or vomiting:  1. The medication in the patch is effective for 72 hours, after which it should be removed.  Wrap patch in a tissue and discard in the trash. Wash hands thoroughly with soap and water. 2. You may remove the patch  earlier than 72 hours if you experience unpleasant side effects which may include dry mouth, dizziness or visual disturbances. 3. Avoid touching the patch. Wash your hands with soap and water after contact with the patch.      Regional Anesthesia Blocks  1. Numbness or the inability to move the "blocked" extremity may last from 3-48 hours after placement. The length of time depends on the medication injected and your individual response to the medication. If the numbness is not going away after 48 hours, call your surgeon.  2. The extremity that is blocked will need to be protected until the numbness is gone and the  Strength has returned. Because you cannot feel it, you will need to take extra care to avoid injury. Because it may be weak, you may have difficulty moving it or using it. You may not know what position it is in without looking at it while the block is in effect.  3. For blocks in the legs and feet, returning to weight bearing and walking needs to be done carefully. You will need to wait until the numbness is entirely gone and the strength has returned. You should be able to move your leg and foot normally before you try and bear weight or walk. You will need someone to be with you when you first try to ensure you do not fall and possibly risk injury.  4. Bruising and tenderness at the needle site are common side effects and will resolve in a few days.  5. Persistent numbness or new problems with movement should be communicated to the surgeon or the [facility] [phone] [place] [phone]).  Weight Bearing X Bear weight only on your operated foot in the post-op shoe.   Cast / Splint / Dressing X Keep your splint, cast or dressing clean and dry.  Dont put anything (coat hanger, pencil, etc) down inside of it.  If it gets damp, use a hair dryer on the cool setting to dry it.  If it gets soaked, call the office to schedule an appointment for a  cast change.    After your dressing, cast or splint is removed; you may shower, but do not soak or scrub the wound.  Allow the water to run over it, and then gently pat it dry.  Swelling It is normal for you to have swelling where you had surgery.  To reduce swelling and pain, keep your toes above your nose for at least 3 days after surgery.  It may be necessary to keep your foot or leg elevated for several weeks.  If it hurts, it should be elevated.  Follow Up Call my office at [phone] when you are discharged from the hospital or surgery center to schedule an appointment to be seen two weeks after surgery.  Call my office at [phone] if you develop a fever >101.5 F, nausea, vomiting, bleeding from the surgical site or severe pain.

## 2019-02-24 NOTE — Transfer of Care (Signed)
Immediate Anesthesia Transfer of Care Note  Patient: [name]  Procedure(s) Performed: Dorsal Exostectomy (Left Foot)  Patient Location: PACU  Anesthesia Type:MAC and Regional  Level of Consciousness: awake, alert  and oriented  Airway & Oxygen Therapy: Patient Spontanous Breathing and Patient connected to face mask oxygen  Post-op Assessment: Report given to RN and Post -op Vital signs reviewed and stable  Post vital signs: Reviewed and stable  Last Vitals:  Vitals Value Taken Time  BP    Temp    Pulse 73 02/24/2019 10:31 AM  Resp 14 02/24/2019 10:31 AM  SpO2 100 % 02/24/2019 10:31 AM  Vitals shown include unvalidated device data.  Last Pain:  Vitals:   02/24/19 0841  TempSrc: Oral  PainSc: 0-No pain      Patients Stated Pain Goal: 0 (55/73/22 0254)  Complications: No apparent anesthesia complications

## 2019-02-24 NOTE — Anesthesia Procedure Notes (Addendum)
Anesthesia Regional Block: Adductor canal block   Pre-Anesthetic Checklist: ,, timeout performed, Correct Patient, Correct Site, Correct Laterality, Correct Procedure, Correct Position, site marked, Risks and benefits discussed, Surgical consent,  Pre-op evaluation,  At surgeon's request  Laterality: Left and Lower  Prep: Maximum Sterile Barrier Precautions used, chloraprep       Needles:  Injection technique: Single-shot  Needle Type: Echogenic Stimulator Needle     Needle Length: 10cm      Additional Needles:   Procedures:,,,, ultrasound used (permanent image in chart),,,,  Narrative:  Start time: 02/24/2019 9:10 AM End time: 02/24/2019 9:15 AM Injection made incrementally with aspirations every 5 mL.  Performed by: Personally  Anesthesiologist: Montez Hageman, MD  Additional Notes: Risks, benefits and alternative to block explained extensively.  Patient tolerated procedure well, without complications.

## 2019-02-25 ENCOUNTER — Encounter (HOSPITAL_BASED_OUTPATIENT_CLINIC_OR_DEPARTMENT_OTHER): Payer: Self-pay | Admitting: Orthopedic Surgery

## 2019-02-27 MED FILL — TRINTELLIX 10 MG TABLET: 10 | 90 days supply | Qty: 90 | Fill #1

## 2019-03-03 MED FILL — ZOLPIDEM TARTRATE 10 MG TAB: 10 | 90 days supply | Qty: 90 | Fill #0

## 2019-03-04 MED FILL — DOTTI 0.1 MG/24HR PTTW: 0.1 | 84 days supply | Qty: 24 | Fill #5

## 2019-03-30 ENCOUNTER — Other Ambulatory Visit: Payer: Self-pay | Admitting: Specialist

## 2019-03-30 ENCOUNTER — Other Ambulatory Visit (HOSPITAL_COMMUNITY): Payer: Self-pay | Admitting: Specialist

## 2019-03-30 DIAGNOSIS — M25512 Pain in left shoulder: Secondary | ICD-10-CM

## 2019-03-31 MED FILL — PROGESTERONE 100 MG CAPSULE: 100 | 90 days supply | Qty: 90 | Fill #5

## 2019-04-05 ENCOUNTER — Ambulatory Visit (HOSPITAL_COMMUNITY)
Admission: RE | Admit: 2019-04-05 | Discharge: 2019-04-05 | Disposition: A | Discharge status: Home / Self Care | Payer: 59 | Source: Ambulatory Visit | Attending: Specialist | Admitting: Specialist

## 2019-04-05 ENCOUNTER — Other Ambulatory Visit: Payer: Self-pay

## 2019-04-05 DIAGNOSIS — M25512 Pain in left shoulder: Secondary | ICD-10-CM | POA: Insufficient documentation

## 2019-04-07 DIAGNOSIS — Z1231 Encounter for screening mammogram for malignant neoplasm of breast: Secondary | ICD-10-CM | POA: Diagnosis not present

## 2019-04-11 DIAGNOSIS — M7502 Adhesive capsulitis of left shoulder: Secondary | ICD-10-CM | POA: Diagnosis not present

## 2019-04-11 DIAGNOSIS — M25512 Pain in left shoulder: Secondary | ICD-10-CM | POA: Diagnosis not present

## 2019-04-15 MED FILL — MAGIC MOUTHWASH BOP FORM: 8 days supply | Qty: 240 | Fill #0

## 2019-04-21 DIAGNOSIS — E785 Hyperlipidemia, unspecified: Secondary | ICD-10-CM | POA: Diagnosis not present

## 2019-04-21 DIAGNOSIS — Z5181 Encounter for therapeutic drug level monitoring: Secondary | ICD-10-CM | POA: Diagnosis not present

## 2019-04-25 DIAGNOSIS — Z0000 Encounter for general adult medical examination without abnormal findings: Secondary | ICD-10-CM | POA: Diagnosis not present

## 2019-04-28 MED FILL — MAGIC MOUTHWASH BOP FORM: 8 days supply | Qty: 240 | Fill #0

## 2019-05-03 DIAGNOSIS — H00024 Hordeolum internum left upper eyelid: Secondary | ICD-10-CM | POA: Diagnosis not present

## 2019-05-03 MED FILL — DOXYCYCLINE HYCLATE 100 MG: 100 | 14 days supply | Qty: 28 | Fill #0

## 2019-05-27 MED FILL — DOTTI 0.1 MG/24HR PTTW: 0.1 | 84 days supply | Qty: 24 | Fill #6

## 2019-05-30 MED FILL — MAGIC MOUTHWASH BOP FORM: 8 days supply | Qty: 240 | Fill #1

## 2019-05-31 MED FILL — ZOLPIDEM TARTRATE 10 MG TAB: 10 | 90 days supply | Qty: 90 | Fill #1

## 2019-05-31 MED FILL — TRINTELLIX 10 MG TABLET: 10 | 90 days supply | Qty: 90 | Fill #2

## 2019-05-31 MED FILL — FLUCONAZOLE 150 MG TABS: 150 | 15 days supply | Qty: 5 | Fill #0

## 2019-06-17 DIAGNOSIS — K140 Glossitis: Secondary | ICD-10-CM | POA: Diagnosis not present

## 2019-06-17 DIAGNOSIS — L304 Erythema intertrigo: Secondary | ICD-10-CM | POA: Diagnosis not present

## 2019-06-17 MED FILL — FLUCONAZOLE 150 MG TABS: 150 | 30 days supply | Qty: 10 | Fill #0

## 2019-06-30 DIAGNOSIS — T781XXD Other adverse food reactions, not elsewhere classified, subsequent encounter: Secondary | ICD-10-CM | POA: Diagnosis not present

## 2019-07-01 MED FILL — PROGESTERONE 100 MG CAPSULE: 100 | 60 days supply | Qty: 60 | Fill #6

## 2019-07-12 ENCOUNTER — Encounter: Payer: Self-pay | Admitting: Gynecology

## 2019-07-13 MED FILL — FLUCONAZOLE 150 MG TABS: 150 | 30 days supply | Qty: 10 | Fill #1

## 2019-08-08 DIAGNOSIS — M79672 Pain in left foot: Secondary | ICD-10-CM | POA: Diagnosis not present

## 2019-08-10 MED FILL — FLUCONAZOLE 150 MG TABS: 150 | 30 days supply | Qty: 10 | Fill #2

## 2019-08-18 ENCOUNTER — Other Ambulatory Visit: Payer: Self-pay | Admitting: Gynecology

## 2019-08-18 MED FILL — TRINTELLIX 20 MG TABLET: 20 | 90 days supply | Qty: 90 | Fill #0

## 2019-08-19 ENCOUNTER — Other Ambulatory Visit: Payer: Self-pay | Admitting: Gynecology

## 2019-08-22 MED FILL — DOTTI 0.1 MG/24HR PTTW: 0.1 | 84 days supply | Qty: 24 | Fill #0

## 2019-08-22 MED FILL — PROGESTERONE 100 MG CAPSULE: 100 | 90 days supply | Qty: 90 | Fill #0

## 2019-08-26 MED FILL — ZOLPIDEM TARTRATE 10 MG TAB: 10 | 90 days supply | Qty: 90 | Fill #0

## 2019-09-12 DIAGNOSIS — M79672 Pain in left foot: Secondary | ICD-10-CM | POA: Diagnosis not present

## 2019-09-26 DIAGNOSIS — M25512 Pain in left shoulder: Secondary | ICD-10-CM | POA: Diagnosis not present

## 2019-11-09 MED FILL — DOTTI 0.1 MG/24HR PTTW: 0.1 | 84 days supply | Qty: 24 | Fill #1

## 2019-11-09 MED FILL — TRINTELLIX 20 MG TABLET: 20 | 90 days supply | Qty: 90 | Fill #1

## 2019-11-15 MED FILL — PROGESTERONE 100 MG CAPSULE: 100 | 90 days supply | Qty: 90 | Fill #1

## 2019-11-22 DIAGNOSIS — D225 Melanocytic nevi of trunk: Secondary | ICD-10-CM | POA: Diagnosis not present

## 2019-11-22 DIAGNOSIS — L918 Other hypertrophic disorders of the skin: Secondary | ICD-10-CM | POA: Diagnosis not present

## 2019-11-22 DIAGNOSIS — L821 Other seborrheic keratosis: Secondary | ICD-10-CM | POA: Diagnosis not present

## 2019-11-22 DIAGNOSIS — D485 Neoplasm of uncertain behavior of skin: Secondary | ICD-10-CM | POA: Diagnosis not present

## 2019-11-22 MED FILL — ZOLPIDEM TARTRATE 10 MG TAB: 10 | 90 days supply | Qty: 90 | Fill #1

## 2019-11-24 DIAGNOSIS — R000 Tachycardia, unspecified: Secondary | ICD-10-CM | POA: Insufficient documentation

## 2019-11-25 DIAGNOSIS — I493 Ventricular premature depolarization: Secondary | ICD-10-CM | POA: Diagnosis not present

## 2019-11-29 ENCOUNTER — Telehealth: Payer: Self-pay

## 2019-11-29 MED ORDER — METOPROLOL TARTRATE 25 MG PO TABS: 25.0000 mg | ORAL_TABLET | Freq: Two times a day (BID) | ORAL | 3 refills | Status: DC

## 2019-11-29 MED FILL — METOPROLOL TARTRATE 25 MG T: 25 | 90 days supply | Qty: 180 | Fill #0

## 2019-11-29 NOTE — Telephone Encounter (Signed)
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 She texted me tonight asking to be seen. Will you add her to my schedule on Friday, Feb 26 at 10 as a new consult. Will you also send in a prescription for Metoprolol 25 mg BID to the [facility] out patient pharmacy  Thanks  Abbe Amsterdam

## 2019-11-29 NOTE — Telephone Encounter (Signed)
Left detailed message on patient's VM (per DPR) letting her know of appt with Dr. Acie Fredrickson on 12/09/19 at 10 AM and that metoprolol 25 mg BID has been called in to [facility]. Instructed for patient to call back with any questions.

## 2019-12-09 ENCOUNTER — Ambulatory Visit (INDEPENDENT_AMBULATORY_CARE_PROVIDER_SITE_OTHER): Payer: 59 | Admitting: Cardiovascular Disease

## 2019-12-09 ENCOUNTER — Other Ambulatory Visit: Payer: Self-pay

## 2019-12-09 ENCOUNTER — Encounter: Payer: Self-pay | Admitting: Cardiovascular Disease

## 2019-12-09 VITALS — BP 102/64 | HR 67 | Ht 68.0 in | Wt 195.8 lb

## 2019-12-09 DIAGNOSIS — I472 Ventricular tachycardia, unspecified: Secondary | ICD-10-CM

## 2019-12-09 DIAGNOSIS — R000 Tachycardia, unspecified: Secondary | ICD-10-CM | POA: Insufficient documentation

## 2019-12-09 DIAGNOSIS — I10 Essential (primary) hypertension: Secondary | ICD-10-CM | POA: Diagnosis not present

## 2019-12-09 LAB — TSH: TSH: 1.6 u[IU]/mL (ref 0.450–4.500)

## 2019-12-09 LAB — BASIC METABOLIC PANEL
BUN/Creatinine Ratio: 16 (ref 9–23)
BUN: 15 mg/dL (ref 6–24)
CO2: 25 mmol/L (ref 20–29)
Calcium: 9.3 mg/dL (ref 8.7–10.2)
Chloride: 99 mmol/L (ref 96–106)
Creatinine, Ser: 0.93 mg/dL (ref 0.57–1.00)
GFR calc Af Amer: 79 mL/min/{1.73_m2} (ref >59)
GFR calc non Af Amer: 68 mL/min/{1.73_m2} (ref >59)
Glucose: 79 mg/dL (ref 65–99)
Potassium: 4.5 mmol/L (ref 3.5–5.2)
Sodium: 138 mmol/L (ref 134–144)

## 2019-12-09 NOTE — Progress Notes (Signed)
Cardiology Office Note:    Date:  12/09/2019   ID:  [name], DOB 09/29/1961, MRN [id number]  PCP:  Maurice Small, MD  Cardiologist:  No primary care provider on file.  Electrophysiologist:  None   Referring MD: Maurice Small, MD   Chief Complaint  Patient presents with  . Tachycardia  . Hypertension    History of Present Illness:    [name] is a 59 y.o. female with a hx of hypertension and tachycardia.  She texted me several weeks ago complaining of higher than normal heart rate and higher than normal blood pressure.  We were asked to see her by Maurice Small, MD for further evaluation of her palpitations and HTN She is a CRNA  She had episodes of sinus tach  - 120s- 130s  , would last several hours.   Not associated with exertion.  According to her watch, she has frequent PVCs  , has had bigeminy   Has had some episodes in the past .  Started having some hot flashes at night about 4 months ago  Some walking - had foot surgery last year.   Stress fracture after the surgery .  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   Past Medical History:  Diagnosis Date  . Anxiety   . Arthritis   . Back pain   . Chronic rhinitis   . Complication of anesthesia   . Depression   . Family history of colonic polyps   . Hepatic hemangioma   . Hyperlipidemia   . IBS (irritable bowel syndrome)   . Insomnia   . Migraine   . Osteoarthritis, foot, localized    left  . Perimenopause   . Peripheral neuropathy   . PONV (postoperative nausea and vomiting)   . Raynauds syndrome   . Seasonal allergies   . Von Willebrand's disease ([place])     bruises easily    Past Surgical History:  Procedure Laterality Date  . BONE EXOSTOSIS EXCISION Left 02/24/2019   Procedure: Dorsal Exostectomy;  Surgeon: Wylene Simmer, MD;  Location: [facility];  Service: Orthopedics;  Laterality: Left;  . CESAREAN SECTION    . laparoscopy removal of right ovarian cyst    . left knee arthroscopy    . NOVASURE ABLATION  12/2005  . pylonidal cyst removal    . SEPTOPLASTY      Current Medications: Current Meds  Medication Sig  . cholecalciferol (VITAMIN D3) 25 MCG (1000 UNIT) tablet Take 1,000 Units by mouth daily.  . fluconazole (DIFLUCAN) 150 MG tablet Take 150 mg by mouth daily.  Marland Kitchen ipratropium (ATROVENT) 0.06 % nasal spray Place 2 sprays into both nostrils 3 (three) times daily.  . Magnesium Chloride-Calcium 64-106 MG TBEC Take by mouth.  . metoprolol tartrate (LOPRESSOR) 25 MG tablet Take 1 tablet (25 mg total) by mouth 2 (two) times daily.  . Probiotic Product (PROBIOTIC PO) Take by mouth.  . progesterone (PROMETRIUM) 100 MG capsule Take 1 capsule (100 mg total) by mouth at bedtime.  Marland Kitchen VIVELLE-DOT 0.1 MG/24HR patch  Place 1 patch (0.1 mg total) onto the skin 2 (two) times a week.  . vortioxetine HBr (TRINTELLIX) 20 MG TABS tablet Take 20 mg by mouth daily.  Marland Kitchen zolpidem (AMBIEN) 10 MG tablet Take 1 tablet (10 mg total) by mouth at bedtime as needed for sleep.     Allergies:   Levaquin [levofloxacin in d5w] and Sulfa antibiotics   Social History   Socioeconomic History  . Marital status: Married    Spouse name: Not on file  . Number of children: Not on file  . Years of education: Not on file  . Highest education level: Not on file  Occupational History  . Not on file  Tobacco Use  . Smoking status: Former Smoker    Years: 10.00    Types: Cigarettes  . Smokeless tobacco: Never Used  Substance and Sexual Activity  . Alcohol use: Yes    Alcohol/week: 7.0 standard drinks    Types: 7 Glasses of wine per week    Comment:  social  . Drug use: No  . Sexual activity: Yes    Partners: Male    Comment: husband vasectomy-1st intercourse 23 yo-5 partners  Other Topics Concern  . Not on file  Social History Narrative  . Not on file   Social Determinants of Health   Financial Resource Strain:   . Difficulty of Paying Living Expenses: Not on file  Food Insecurity:   . Worried About Charity fundraiser in the Last Year: Not on file  . Ran Out of Food in the Last Year: Not on file  Transportation Needs:   . Lack of Transportation (Medical): Not on file  . Lack of Transportation (Non-Medical): Not on file  Physical Activity:   . Days of Exercise per Week: Not on file  . Minutes of Exercise per Session: Not on file  Stress:   . Feeling of Stress : Not on file  Social Connections:   . Frequency of Communication with Friends and Family: Not on file  . Frequency of Social Gatherings with Friends and Family: Not on file  . Attends Religious Services: Not on file  . Active Member of Clubs or Organizations: Not on file  . Attends Archivist Meetings: Not on file  . Marital Status: Not on file     Family History: The patient's family history includes Arthritis in her mother; Heart disease in her father; Hypertension in her father and mother; Lupus in her sister; Other in her sister.  ROS:   Please see the history of present illness.     All other systems reviewed and are negative.  EKGs/Labs/Other Studies Reviewed:    The following studies were reviewed today:   EKG:   NSR at 67.   No ST or T wave changes.   Recent Labs: No results found for requested labs within last 8760 hours.  Recent Lipid Panel    Component Value Date/Time   CHOL 179 08/22/2014 1130   TRIG 231 (H) 08/22/2014 1130   HDL 72 08/22/2014 1130   CHOLHDL 2.5 08/22/2014 1130   VLDL 46 (H) 08/22/2014 1130   LDLCALC 61 08/22/2014 1130    Physical Exam:    VS:  BP 102/64   Pulse 67   Ht 5\' 8"  (1.727 m)   Wt 195 lb 12.8  oz (88.8 kg)   LMP 11/14/2012   SpO2 97%   BMI 29.77 kg/m     Wt Readings from Last 3 Encounters:  12/09/19  195 lb 12.8 oz (88.8 kg)  02/24/19 182 lb 8.7 oz (82.8 kg)  07/01/18 179 lb (81.2 kg)     GEN:  Well nourished, well developed in no acute distress HEENT: Normal NECK: No JVD; No carotid bruits LYMPHATICS: No lymphadenopathy CARDIAC: RRR, no murmurs, rubs, gallops RESPIRATORY:  Clear to auscultation without rales, wheezing or rhonchi  ABDOMEN: Soft, non-tender, non-distended MUSCULOSKELETAL:  No edema; No deformity  SKIN: Warm and dry NEUROLOGIC:  Alert and oriented x 3 PSYCHIATRIC:  Normal affect   ASSESSMENT:    1. Essential hypertension   2. Ventricular tachycardia ([place])   3. Sinus tachycardia    PLAN:    In order of problems listed above:  1. Sinus tach: [name] presents today for further discussion of episodes of sinus tachycardia.  These are also associated with some hypertension. We started metoprolol 25 mg twice a day about 10 days ago and her symptoms have greatly improved. She is not been quite as careful with her diet and is probably eating more salt than she should.  She exercises on occasion.  She is active when she works at [employer] but has not been walking so much because of some foot injuries.   Check TSH and BMP today .   For now we will cont the same meds.     2.   Hypertension: She is gained a little weight over the past year.  She thinks she is put on about 20 pounds.  This is likely contributing to her episodes of hypertension.  Her blood pressure is much better on metoprolol.  We discussed diet, exercise, weight loss program.  I suspect that her blood pressure will normalize if she loses the 20 pounds that she gained over the past year. We discussed minimizing her salt intake.  For now we will continue with the medications.  I will see her again in 3 months for follow-up visit.     Medication Adjustments/Labs and Tests Ordered: Current  medicines are reviewed at length with the patient today.  Concerns regarding medicines are outlined above.  Orders Placed This Encounter  Procedures  . Basic metabolic panel  . TSH  . EKG 12-Lead   No orders of the defined types were placed in this encounter.   Patient Instructions  Medication Instructions:  The current medical regimen is effective;  continue present plan and medications.  *If you need a refill on your cardiac medications before your next appointment, please call your pharmacy*   Lab Work: Please have blood work today (TSH, BMP)  If you have labs (blood work) drawn today and your tests are completely normal, you will receive your results only by: Marland Kitchen MyChart Message (if you have MyChart) OR . A paper copy in the mail If you have any lab test that is abnormal or we need to change your treatment, we will call you to review the results.  Follow-Up: At [facility], you and your health needs are our priority.  As part of our continuing mission to provide you with exceptional heart care, we have created designated Provider Care Teams.  These Care Teams include your primary Cardiologist (physician) and Advanced Practice Providers (APPs -  Physician Assistants and Nurse Practitioners) who all work together to provide you with the care you need, when you need it.  We recommend signing up for the patient portal called "MyChart".  Sign up information is provided on this After Visit Summary.  MyChart is used to connect with patients  for Virtual Visits (Telemedicine).  Patients are able to view lab/test results, encounter notes, upcoming appointments, etc.  Non-urgent messages can be sent to your provider as well.   To learn more about what you can do with MyChart, go to [email or web address].    Your next appointment:   3 month(s)  The format for your next appointment:   In Person  Provider:   Mertie Moores, MD   Thank you for choosing [facility]!!         Signed, Mertie Moores, MD  12/09/2019 10:46 AM    [place]

## 2019-12-09 NOTE — Patient Instructions (Signed)
Medication Instructions:  The current medical regimen is effective;  continue present plan and medications.  *If you need a refill on your cardiac medications before your next appointment, please call your pharmacy*   Lab Work: Please have blood work today (TSH, BMP)  If you have labs (blood work) drawn today and your tests are completely normal, you will receive your results only by: Marland Kitchen MyChart Message (if you have MyChart) OR . A paper copy in the mail If you have any lab test that is abnormal or we need to change your treatment, we will call you to review the results.  Follow-Up: At [facility], you and your health needs are our priority.  As part of our continuing mission to provide you with exceptional heart care, we have created designated Provider Care Teams.  These Care Teams include your primary Cardiologist (physician) and Advanced Practice Providers (APPs -  Physician Assistants and Nurse Practitioners) who all work together to provide you with the care you need, when you need it.  We recommend signing up for the patient portal called "MyChart".  Sign up information is provided on this After Visit Summary.  MyChart is used to connect with patients for Virtual Visits (Telemedicine).  Patients are able to view lab/test results, encounter notes, upcoming appointments, etc.  Non-urgent messages can be sent to your provider as well.   To learn more about what you can do with MyChart, go to [email or web address].    Your next appointment:   3 month(s)  The format for your next appointment:   In Person  Provider:   Mertie Moores, MD   Thank you for choosing [facility]!!

## 2019-12-21 MED FILL — AMOXICILLIN 875 MG TABS: 875 | 7 days supply | Qty: 14 | Fill #0

## 2019-12-26 MED FILL — AMOXICILLIN 875 MG TABS: 875 | 7 days supply | Qty: 14 | Fill #1

## 2020-01-04 DIAGNOSIS — K047 Periapical abscess without sinus: Secondary | ICD-10-CM | POA: Diagnosis not present

## 2020-01-04 MED FILL — CHLORHEXIDINE 0.12% RINSE: 0.12 | 16 days supply | Qty: 473 | Fill #0

## 2020-01-05 MED FILL — DOXYCYCLINE HYCLATE 100 MG: 100 | 30 days supply | Qty: 30 | Fill #0

## 2020-01-17 MED FILL — MAGIC MOUTHWASH BOP FORM: 8 days supply | Qty: 240 | Fill #2

## 2020-01-17 MED FILL — FLUCONAZOLE 150 MG TABS: 150 | 15 days supply | Qty: 5 | Fill #1

## 2020-01-24 MED FILL — FLUCONAZOLE 150 MG TABS: 150 | 15 days supply | Qty: 5 | Fill #2

## 2020-01-26 MED FILL — NYSTATIN 100,000 UNIT/GM CR: 100000 | 30 days supply | Qty: 60 | Fill #0

## 2020-01-26 MED FILL — NYSTATIN 100,000 UNITS/ML S: 100000 | 10 days supply | Qty: 160 | Fill #0

## 2020-01-31 MED FILL — DOTTI 0.1 MG/24HR PTTW: 0.1 | 84 days supply | Qty: 24 | Fill #2

## 2020-01-31 MED FILL — FLUCONAZOLE 150 MG TABS: 150 | 15 days supply | Qty: 5 | Fill #3

## 2020-02-13 MED FILL — PROGESTERONE 100 MG CAPS: 100 | 90 days supply | Qty: 90 | Fill #2

## 2020-02-14 MED FILL — TRINTELLIX 20 MG TABLET: 20 | 90 days supply | Qty: 90 | Fill #0

## 2020-02-16 MED FILL — NYSTATIN 100,000 UNITS/ML S: 100000 | 10 days supply | Qty: 160 | Fill #1

## 2020-02-20 DIAGNOSIS — M79672 Pain in left foot: Secondary | ICD-10-CM | POA: Diagnosis not present

## 2020-03-02 MED FILL — NYSTATIN 100,000 UNITS/ML S: 100000 | 10 days supply | Qty: 160 | Fill #2

## 2020-03-02 MED FILL — FLUCONAZOLE 150 MG TABS: 150 | 15 days supply | Qty: 5 | Fill #4

## 2020-03-08 ENCOUNTER — Other Ambulatory Visit: Payer: Self-pay

## 2020-03-08 ENCOUNTER — Other Ambulatory Visit: Payer: Self-pay | Admitting: Cardiovascular Disease

## 2020-03-08 ENCOUNTER — Encounter: Payer: Self-pay | Admitting: Cardiovascular Disease

## 2020-03-08 ENCOUNTER — Ambulatory Visit (INDEPENDENT_AMBULATORY_CARE_PROVIDER_SITE_OTHER): Payer: 59 | Admitting: Cardiovascular Disease

## 2020-03-08 VITALS — BP 102/60 | HR 64 | Ht 68.0 in | Wt 193.0 lb

## 2020-03-08 DIAGNOSIS — R000 Tachycardia, unspecified: Secondary | ICD-10-CM

## 2020-03-08 DIAGNOSIS — M79672 Pain in left foot: Secondary | ICD-10-CM | POA: Diagnosis not present

## 2020-03-08 DIAGNOSIS — M19072 Primary osteoarthritis, left ankle and foot: Secondary | ICD-10-CM | POA: Diagnosis not present

## 2020-03-08 DIAGNOSIS — I10 Essential (primary) hypertension: Secondary | ICD-10-CM | POA: Diagnosis not present

## 2020-03-08 MED ORDER — METOPROLOL SUCCINATE ER 25 MG PO TB24: 25.0000 mg | ORAL_TABLET | Freq: Every day | ORAL | 3 refills | Status: DC

## 2020-03-08 MED FILL — METOPROLOL SUCCINATE ER 25: 25 | 90 days supply | Qty: 90 | Fill #0

## 2020-03-08 NOTE — Patient Instructions (Signed)
Medication Instructions:  Your physician has recommended you make the following change in your medication:  STOP Metoprolol tartrate (Lopressor) START Metoprolol succinate (Toprol XL) 25 mg once daily  *If you need a refill on your cardiac medications before your next appointment, please call your pharmacy*   Lab Work: None Ordered If you have labs (blood work) drawn today and your tests are completely normal, you will receive your results only by: Marland Kitchen MyChart Message (if you have MyChart) OR . A paper copy in the mail If you have any lab test that is abnormal or we need to change your treatment, we will call you to review the results.   Testing/Procedures: None Ordered   Follow-Up: At [facility], you and your health needs are our priority.  As part of our continuing mission to provide you with exceptional heart care, we have created designated Provider Care Teams.  These Care Teams include your primary Cardiologist (physician) and Advanced Practice Providers (APPs -  Physician Assistants and Nurse Practitioners) who all work together to provide you with the care you need, when you need it.   Your next appointment:   6 month(s)  The format for your next appointment:   In Person  Provider:   You may see Mertie Moores, MD or one of the following Advanced Practice Providers on your designated Care Team:    Richardson Dopp, PA-C  Robbie Lis, [place]    Other Instructions Call [place] at [phone]

## 2020-03-08 NOTE — Progress Notes (Signed)
Cardiology Office Note:    Date:  03/08/2020   ID:  [name], DOB 09/21/1961, MRN [id number]  PCP:  Maurice Small, MD  Cardiologist:  No primary care provider on file.  Electrophysiologist:  None   Referring MD: Maurice Small, MD   Chief Complaint  Patient presents with  . Hypertension  . Tachycardia    History of Present Illness:    [name] is a 59 y.o. female with a hx of hypertension and tachycardia.  She texted me several weeks ago complaining of higher than normal heart rate and higher than normal blood pressure.  We were asked to see her by Maurice Small, MD for further evaluation of her palpitations and HTN She is a CRNA  She had episodes of sinus tach  - 120s- 130s  , would last several hours.   Not associated with exertion.  According to her watch, she has frequent PVCs  , has had bigeminy   Has had some episodes in the past .  Started having some hot flashes at night about 4 months ago  Some walking - had foot surgery last year.   Stress fracture after the surgery .  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   Mar 08, 2020  [name] is seen today for follow up of her HTN and sinus tachycardia Had lots of fatigue for the first several months BP has been elevated on occasion  Avoids salt for the most  part  Having issues with yeast infections.  All started with a long coarse of abs.    Past Medical History:  Diagnosis Date  . Anxiety   . Arthritis   . Back pain   . Chronic rhinitis   . Complication of anesthesia   . Depression   . Family history of colonic polyps   . Hepatic hemangioma   .  Hyperlipidemia   . IBS (irritable bowel syndrome)   . Insomnia   . Migraine   . Osteoarthritis, foot, localized    left  . Perimenopause   . Peripheral neuropathy   . PONV (postoperative nausea and vomiting)   . Raynauds syndrome   . Seasonal allergies   . Von Willebrand's disease ([place])    bruises easily    Past Surgical History:  Procedure Laterality Date  . BONE EXOSTOSIS EXCISION Left 02/24/2019   Procedure: Dorsal Exostectomy;  Surgeon: Wylene Simmer, MD;  Location: [facility];  Service: Orthopedics;  Laterality: Left;  . CESAREAN SECTION    . laparoscopy removal of right ovarian cyst    . left knee arthroscopy    . NOVASURE ABLATION  12/2005  . pylonidal cyst removal    . SEPTOPLASTY      Current Medications: Current Meds  Medication Sig  . cholecalciferol (VITAMIN D3) 25 MCG (1000 UNIT) tablet Take 1,000 Units by mouth daily.  . diclofenac Sodium (VOLTAREN) 1 % GEL Apply 2 g topically as needed.  . fluconazole (DIFLUCAN) 150 MG tablet Take 150 mg by mouth daily.  Marland Kitchen ipratropium (ATROVENT) 0.06 % nasal spray  Place 2 sprays into both nostrils 3 (three) times daily.  . Magnesium Chloride-Calcium 64-106 MG TBEC Take by mouth.  . nystatin (MYCOSTATIN) 100000 UNIT/ML suspension Use as directed 4 mLs in the mouth or throat daily as needed.  . nystatin cream (MYCOSTATIN) Apply 1 application topically daily as needed.  . Probiotic Product (PROBIOTIC PO) Take by mouth.  . progesterone (PROMETRIUM) 100 MG capsule Take 1 capsule (100 mg total) by mouth at bedtime.  Marland Kitchen VIVELLE-DOT 0.1 MG/24HR patch Place 1 patch (0.1 mg total) onto the skin 2 (two) times a week.  . vortioxetine HBr (TRINTELLIX) 20 MG TABS tablet Take 20 mg by mouth daily.  Marland Kitchen zolpidem (AMBIEN) 10 MG tablet Take 1 tablet (10 mg total) by mouth at bedtime as needed for sleep.  . [DISCONTINUED] metoprolol tartrate (LOPRESSOR) 25 MG tablet Take 1 tablet (25 mg total) by mouth 2 (two) times daily.      Allergies:   Levaquin [levofloxacin in d5w] and Sulfa antibiotics   Social History   Socioeconomic History  . Marital status: Married    Spouse name: Not on file  . Number of children: Not on file  . Years of education: Not on file  . Highest education level: Not on file  Occupational History  . Not on file  Tobacco Use  . Smoking status: Former Smoker    Years: 10.00    Types: Cigarettes  . Smokeless tobacco: Never Used  Substance and Sexual Activity  . Alcohol use: Yes    Alcohol/week: 7.0 standard drinks    Types: 7 Glasses of wine per week    Comment: social  . Drug use: No  . Sexual activity: Yes    Partners: Male    Comment: husband vasectomy-1st intercourse 34 yo-5 partners  Other Topics Concern  . Not on file  Social History Narrative  . Not on file   Social Determinants of Health   Financial Resource Strain:   . Difficulty of Paying Living Expenses:   Food Insecurity:   . Worried About Charity fundraiser in the Last Year:   . Arboriculturist in the Last Year:   Transportation Needs:   . Film/video editor (Medical):   Marland Kitchen Lack of Transportation (Non-Medical):   Physical Activity:   . Days of Exercise per Week:   . Minutes of Exercise per Session:   Stress:   . Feeling of Stress :   Social Connections:   . Frequency of Communication with Friends and Family:   . Frequency of Social Gatherings with Friends and Family:   . Attends Religious Services:   . Active Member of Clubs or Organizations:   . Attends Archivist Meetings:   Marland Kitchen Marital Status:      Family History: The patient's family history includes Arthritis in her mother; Heart disease in her father; Hypertension in her father and mother; Lupus in her sister; Other in her sister.  ROS:   Please see the history of present illness.     All other systems reviewed and are negative.  EKGs/Labs/Other Studies Reviewed:    The following studies were reviewed today:   EKG:      Recent Labs: 12/09/2019: BUN 15; Creatinine, Ser 0.93; Potassium 4.5; Sodium 138; TSH 1.600  Recent Lipid Panel    Component Value Date/Time   CHOL 179 08/22/2014 1130   TRIG 231 (H) 08/22/2014 1130   HDL 72 08/22/2014 1130   CHOLHDL 2.5 08/22/2014 1130  VLDL 46 (H) 08/22/2014 1130   LDLCALC 61 08/22/2014 1130    Physical Exam: Blood pressure 102/60, pulse 64, height 5\' 8"  (1.727 m), weight 193 lb (87.5 kg), last menstrual period 11/14/2012, SpO2 96 %.  GEN:  Well nourished, well developed in no acute distress HEENT: Normal NECK: No JVD; No carotid bruits LYMPHATICS: No lymphadenopathy CARDIAC: RRR , no murmurs, rubs, gallops RESPIRATORY:  Clear to auscultation without rales, wheezing or rhonchi  ABDOMEN: Soft, non-tender, non-distended MUSCULOSKELETAL:  No edema; No deformity  SKIN: Warm and dry NEUROLOGIC:  Alert and oriented x 3   ASSESSMENT:    No diagnosis found. PLAN:    In order of problems listed above:  1.  Sinus tach:   Her sinus tachycardia is better.  She has had some fatigue.  We will reduce the metoprolol to 25 mg a day and give her the Toprol-XL form of that medication.      2.   Hypertension: Her blood pressure is very low today but she states that she has some blood pressure elevations on occasion.  She cannot relate it to eating excess salt.  She will pay particular attention to her blood pressure since her lowering the dose of the metoprolol.  She might need a low-dose of losartan to go along with the metoprolol to achieve adequate blood pressure control.  She will continue to stick with a good diet, exercise, weight loss efforts.  3.  Chronic yeast infections.  She has had chronic yeast issues since being on a prolonged course of antibiotics for a sinus infection.  We will send her to Dr. Mylinda Latina at [facility] for further evaluation and management .      Medication Adjustments/Labs and Tests Ordered: Current medicines  are reviewed at length with the patient today.  Concerns regarding medicines are outlined above.  No orders of the defined types were placed in this encounter.  Meds ordered this encounter  Medications  . metoprolol succinate (TOPROL XL) 25 MG 24 hr tablet    Sig: Take 1 tablet (25 mg total) by mouth daily.    Dispense:  90 tablet    Refill:  3    Patient Instructions  Medication Instructions:  Your physician has recommended you make the following change in your medication:  STOP Metoprolol tartrate (Lopressor) START Metoprolol succinate (Toprol XL) 25 mg once daily  *If you need a refill on your cardiac medications before your next appointment, please call your pharmacy*   Lab Work: None Ordered If you have labs (blood work) drawn today and your tests are completely normal, you will receive your results only by: Marland Kitchen MyChart Message (if you have MyChart) OR . A paper copy in the mail If you have any lab test that is abnormal or we need to change your treatment, we will call you to review the results.   Testing/Procedures: None Ordered   Follow-Up: At [facility], you and your health needs are our priority.  As part of our continuing mission to provide you with exceptional heart care, we have created designated Provider Care Teams.  These Care Teams include your primary Cardiologist (physician) and Advanced Practice Providers (APPs -  Physician Assistants and Nurse Practitioners) who all work together to provide you with the care you need, when you need it.   Your next appointment:   6 month(s)  The format for your next appointment:   In Person  Provider:   You may see Mertie Moores, MD  or one of the following Advanced Practice Providers on your designated Care Team:    Richardson Dopp, PA-C  Robbie Lis, [place]    Other Instructions Call [place] at [phone]     Signed, Mertie Moores, MD  03/08/2020 11:36 AM    [place]

## 2020-03-16 MED FILL — FLUCONAZOLE 150 MG TABS: 150 | 90 days supply | Qty: 30 | Fill #0

## 2020-03-28 MED FILL — CYCLOBENZAPRINE HCL 10 MG T: 10 | 30 days supply | Qty: 30 | Fill #0

## 2020-04-10 DIAGNOSIS — R5383 Other fatigue: Secondary | ICD-10-CM | POA: Diagnosis not present

## 2020-04-10 DIAGNOSIS — E663 Overweight: Secondary | ICD-10-CM | POA: Diagnosis not present

## 2020-04-10 DIAGNOSIS — E60 Dietary zinc deficiency: Secondary | ICD-10-CM | POA: Diagnosis not present

## 2020-04-10 DIAGNOSIS — K589 Irritable bowel syndrome without diarrhea: Secondary | ICD-10-CM | POA: Diagnosis not present

## 2020-04-10 DIAGNOSIS — E559 Vitamin D deficiency, unspecified: Secondary | ICD-10-CM | POA: Diagnosis not present

## 2020-04-10 DIAGNOSIS — I10 Essential (primary) hypertension: Secondary | ICD-10-CM | POA: Diagnosis not present

## 2020-04-10 DIAGNOSIS — B372 Candidiasis of skin and nail: Secondary | ICD-10-CM | POA: Diagnosis not present

## 2020-04-10 DIAGNOSIS — N959 Unspecified menopausal and perimenopausal disorder: Secondary | ICD-10-CM | POA: Diagnosis not present

## 2020-04-10 DIAGNOSIS — Z1329 Encounter for screening for other suspected endocrine disorder: Secondary | ICD-10-CM | POA: Diagnosis not present

## 2020-05-12 MED FILL — PROGESTERONE 100 MG CAPS: 100 | 90 days supply | Qty: 90 | Fill #3

## 2020-05-16 MED FILL — TRINTELLIX 20 MG TABLET: 20 | 90 days supply | Qty: 90 | Fill #1

## 2020-05-22 MED FILL — METOPROLOL TARTRATE 25 MG T: 25 | 90 days supply | Qty: 180 | Fill #2

## 2020-05-24 MED FILL — ZOLPIDEM TARTRATE 10 MG TAB: 10 | 90 days supply | Qty: 90 | Fill #1

## 2020-05-31 MED FILL — METOPROLOL SUCCINATE ER 25: 25 | 90 days supply | Qty: 90 | Fill #1

## 2020-06-01 DIAGNOSIS — Z1231 Encounter for screening mammogram for malignant neoplasm of breast: Secondary | ICD-10-CM | POA: Diagnosis not present

## 2020-06-05 DIAGNOSIS — E785 Hyperlipidemia, unspecified: Secondary | ICD-10-CM | POA: Diagnosis not present

## 2020-06-05 DIAGNOSIS — Z0000 Encounter for general adult medical examination without abnormal findings: Secondary | ICD-10-CM | POA: Diagnosis not present

## 2020-06-13 MED FILL — ALPRAZolam 0.25 MG TABS: 0.25 | 30 days supply | Qty: 20 | Fill #0

## 2020-07-23 ENCOUNTER — Other Ambulatory Visit (HOSPITAL_COMMUNITY): Payer: Self-pay | Admitting: Family Medicine

## 2020-07-23 MED FILL — TRINTELLIX 10 MG TABLET: 10 | 90 days supply | Qty: 90 | Fill #0

## 2020-07-26 DIAGNOSIS — R5383 Other fatigue: Secondary | ICD-10-CM | POA: Diagnosis not present

## 2020-07-26 DIAGNOSIS — E559 Vitamin D deficiency, unspecified: Secondary | ICD-10-CM | POA: Diagnosis not present

## 2020-07-26 DIAGNOSIS — K589 Irritable bowel syndrome without diarrhea: Secondary | ICD-10-CM | POA: Diagnosis not present

## 2020-07-26 DIAGNOSIS — B372 Candidiasis of skin and nail: Secondary | ICD-10-CM | POA: Diagnosis not present

## 2020-07-26 DIAGNOSIS — I10 Essential (primary) hypertension: Secondary | ICD-10-CM | POA: Diagnosis not present

## 2020-07-26 DIAGNOSIS — E663 Overweight: Secondary | ICD-10-CM | POA: Diagnosis not present

## 2020-07-26 DIAGNOSIS — Z1329 Encounter for screening for other suspected endocrine disorder: Secondary | ICD-10-CM | POA: Diagnosis not present

## 2020-07-26 DIAGNOSIS — N959 Unspecified menopausal and perimenopausal disorder: Secondary | ICD-10-CM | POA: Diagnosis not present

## 2020-07-26 DIAGNOSIS — R748 Abnormal levels of other serum enzymes: Secondary | ICD-10-CM | POA: Diagnosis not present

## 2020-08-13 ENCOUNTER — Other Ambulatory Visit (HOSPITAL_COMMUNITY): Payer: Self-pay | Admitting: Family Medicine

## 2020-08-13 MED FILL — PROGESTERONE 100 MG CAPS: 100 | 90 days supply | Qty: 90 | Fill #0

## 2020-08-29 MED FILL — METOPROLOL SUCCINATE ER 25: 25 | 90 days supply | Qty: 90 | Fill #2

## 2020-09-17 ENCOUNTER — Other Ambulatory Visit: Payer: Self-pay | Admitting: Physician Assistant

## 2020-09-17 DIAGNOSIS — R748 Abnormal levels of other serum enzymes: Secondary | ICD-10-CM

## 2020-09-17 DIAGNOSIS — R7401 Elevation of levels of liver transaminase levels: Secondary | ICD-10-CM

## 2020-09-24 ENCOUNTER — Ambulatory Visit
Admission: RE | Admit: 2020-09-24 | Discharge: 2020-09-24 | Disposition: A | Discharge status: Home / Self Care | Payer: 59 | Source: Ambulatory Visit | Attending: Physician Assistant | Admitting: Physician Assistant

## 2020-09-24 DIAGNOSIS — R748 Abnormal levels of other serum enzymes: Secondary | ICD-10-CM

## 2020-09-24 DIAGNOSIS — R7401 Elevation of levels of liver transaminase levels: Secondary | ICD-10-CM

## 2020-09-24 DIAGNOSIS — R7989 Other specified abnormal findings of blood chemistry: Secondary | ICD-10-CM | POA: Diagnosis not present

## 2020-09-27 DIAGNOSIS — R748 Abnormal levels of other serum enzymes: Secondary | ICD-10-CM | POA: Diagnosis not present

## 2020-09-27 DIAGNOSIS — Z1329 Encounter for screening for other suspected endocrine disorder: Secondary | ICD-10-CM | POA: Diagnosis not present

## 2020-09-27 DIAGNOSIS — R5383 Other fatigue: Secondary | ICD-10-CM | POA: Diagnosis not present

## 2020-09-27 DIAGNOSIS — N959 Unspecified menopausal and perimenopausal disorder: Secondary | ICD-10-CM | POA: Diagnosis not present

## 2020-09-27 DIAGNOSIS — K589 Irritable bowel syndrome without diarrhea: Secondary | ICD-10-CM | POA: Diagnosis not present

## 2020-09-27 DIAGNOSIS — I10 Essential (primary) hypertension: Secondary | ICD-10-CM | POA: Diagnosis not present

## 2020-09-27 DIAGNOSIS — E663 Overweight: Secondary | ICD-10-CM | POA: Diagnosis not present

## 2020-09-27 DIAGNOSIS — E559 Vitamin D deficiency, unspecified: Secondary | ICD-10-CM | POA: Diagnosis not present

## 2020-09-27 DIAGNOSIS — B372 Candidiasis of skin and nail: Secondary | ICD-10-CM | POA: Diagnosis not present

## 2020-10-03 IMAGING — MR MRI OF THE LEFT SHOULDER WITHOUT CONTRAST
4 of 5 series · 19 of 40 positions shown · non-contrast
Comparison: None.

CLINICAL DATA: Left shoulder pain since patient's dog pulled her
shoulder while walking in November 2018. Initial encounter.

EXAM:
MRI OF THE LEFT SHOULDER WITHOUT CONTRAST
TECHNIQUE: Multiplanar, multisequence MR imaging of the shoulder was performed.
No intravenous contrast was administered.

[Series 3: PD · axial · 4.0mm · 0.27mm/px · z∈[-122,-15]mm · 6 of 30 slices shown]
[im 1/30]
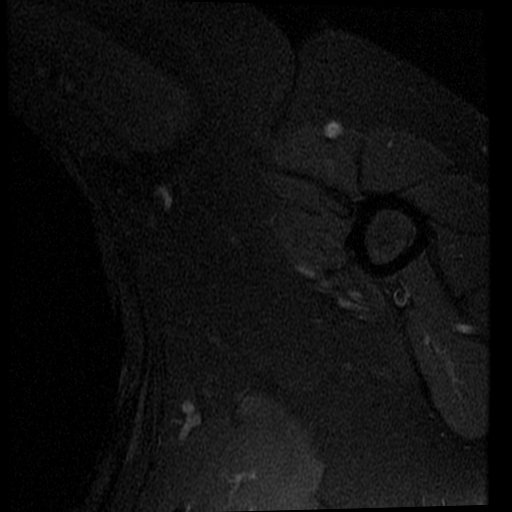
[im 4/30]
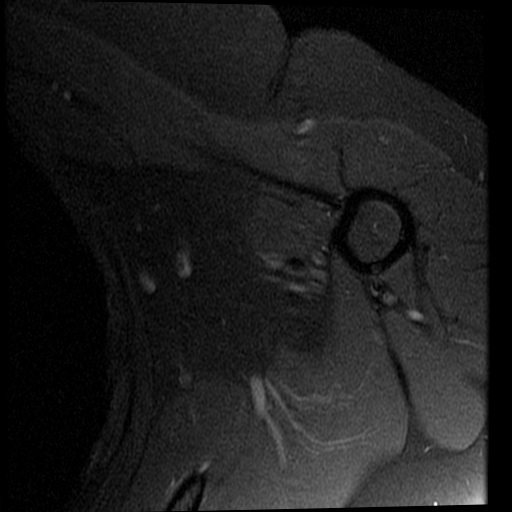
[im 10/30]
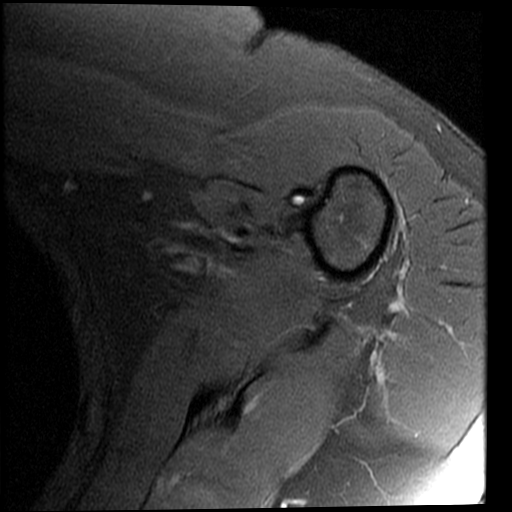
[im 13/30]
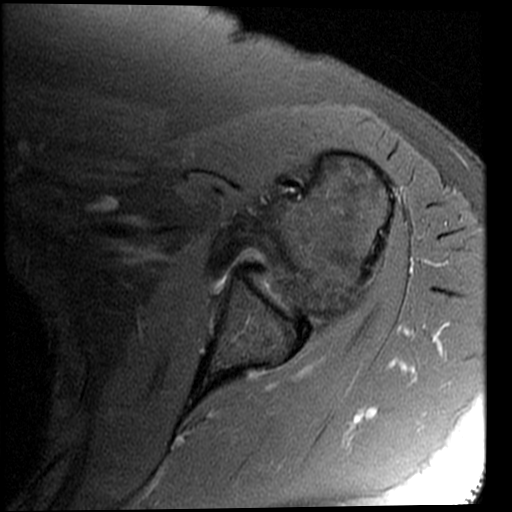
[im 17/30]
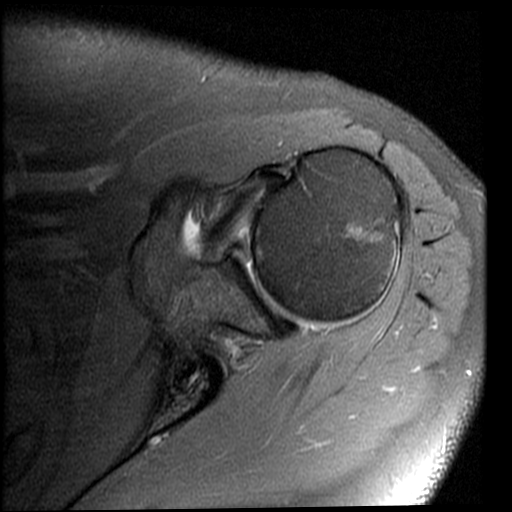
[im 26/30]
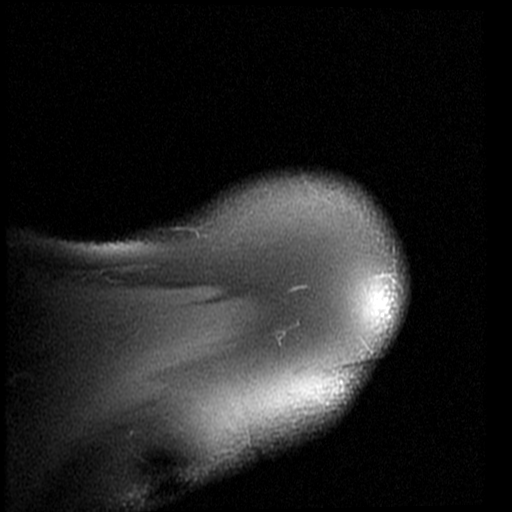

[Series 4: T2 fat-sat · oblique · 4.0mm · 0.27mm/px · 3 of 24 slices shown (1 of 2)]
[im 4/24]
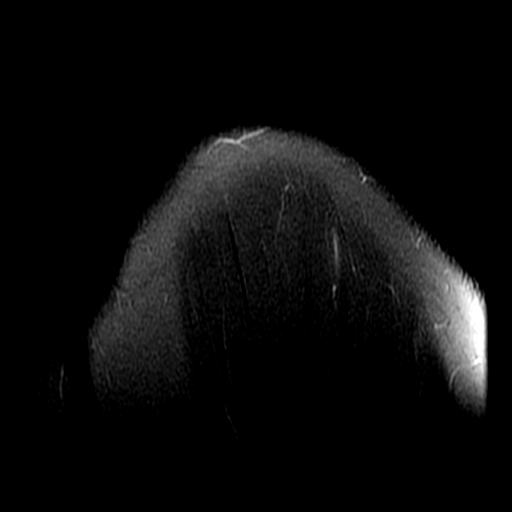
[im 14/24]
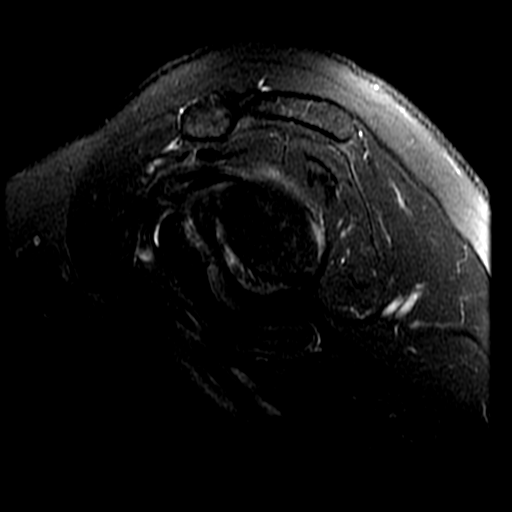
[im 20/24]
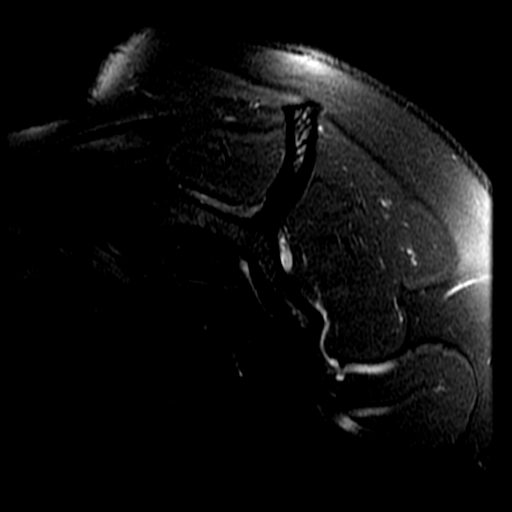

[Series 5: T2 fat-sat · sagittal · 4.0mm · 0.29mm/px · 3 of 22 slices shown (2 of 2)]
[im 4/22]
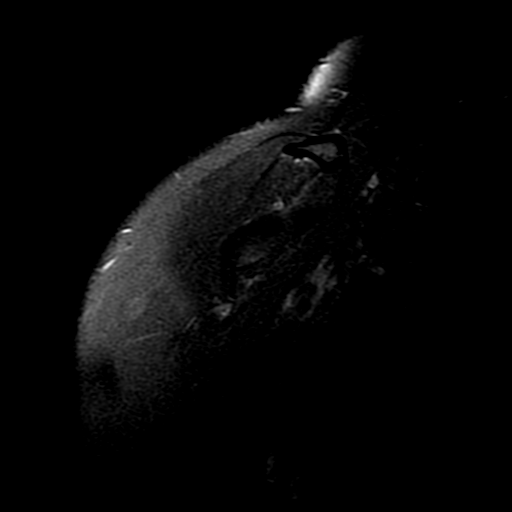
[im 11/22]
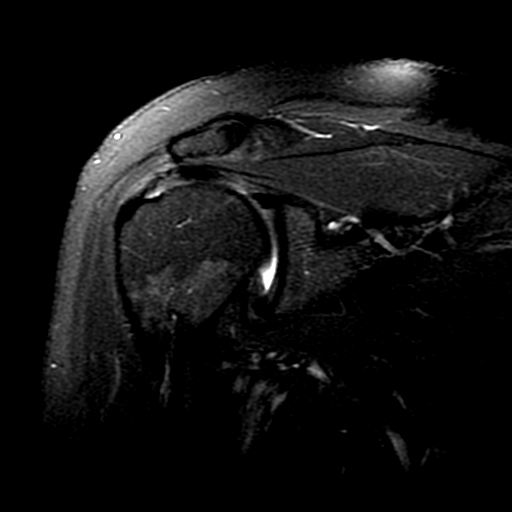
[im 18/22]
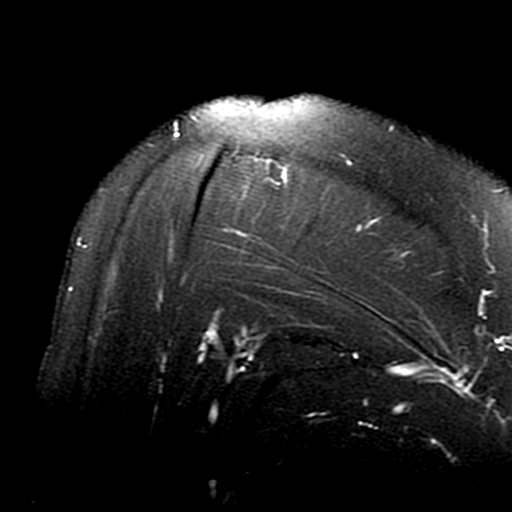

[Series 6: PD fat-sat · sagittal · 4.0mm · 0.29mm/px · 7 of 22 slices shown]
[im 1/22]
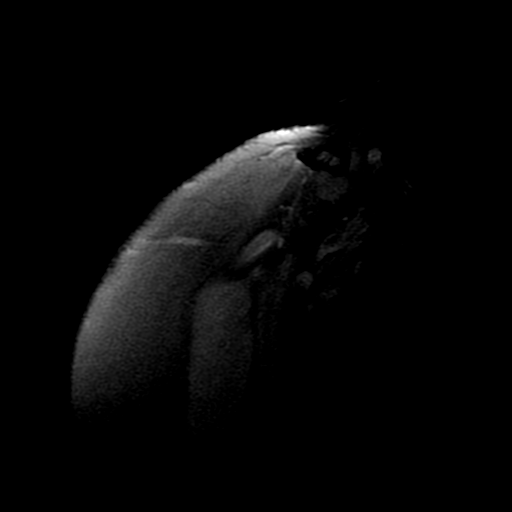
[im 4/22]
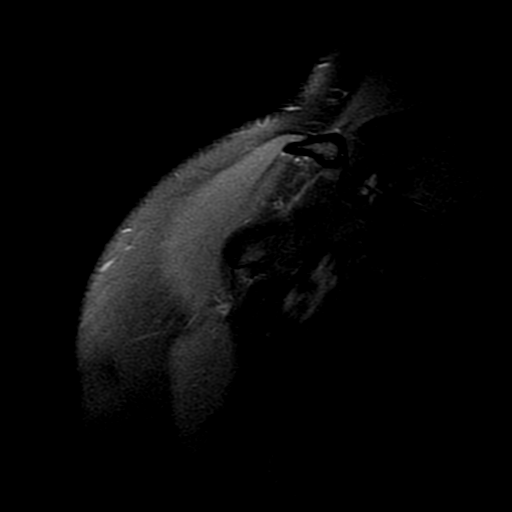
[im 8/22]
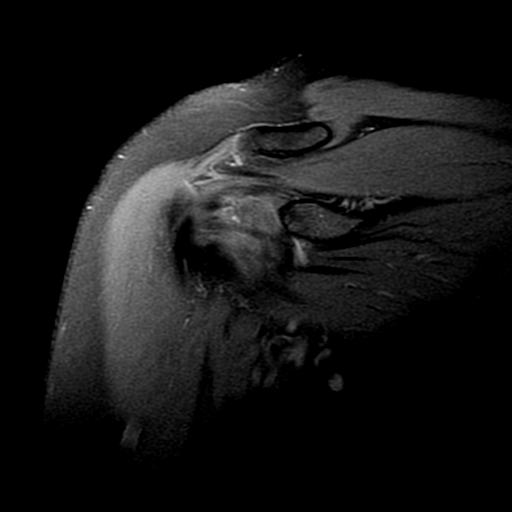
[im 11/22]
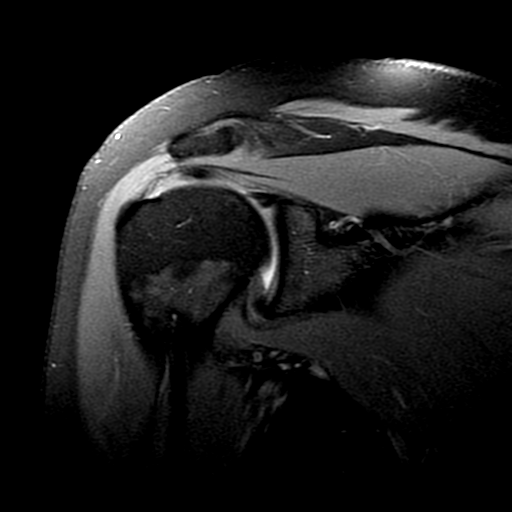
[im 15/22]
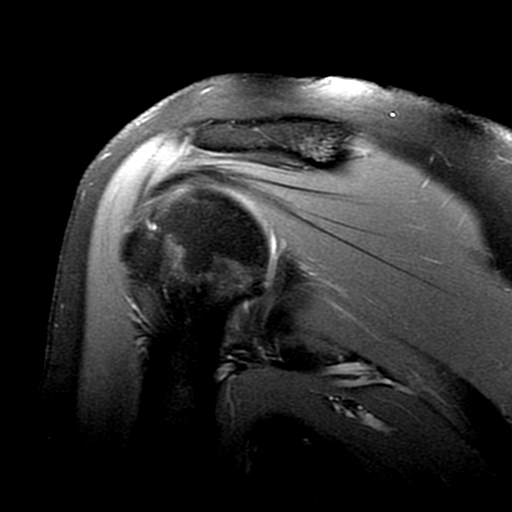
[im 18/22]
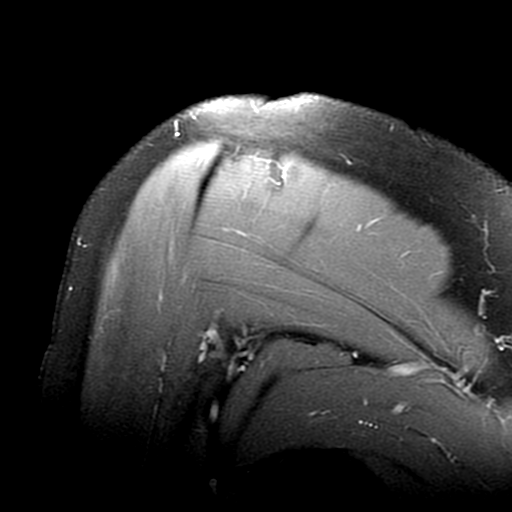
[im 22/22]
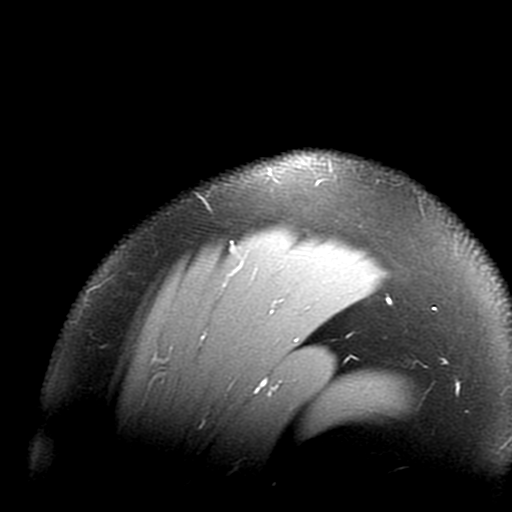

[19 of 40 positions shown; findings below may reference images not displayed]

FINDINGS: Rotator cuff: Mild heterogeneously increased T2 signal rotator cuff
tendons is consistent with tendinopathy. No tear.

Muscles:  Normal without atrophy or focal lesion.

Biceps long head:  Intact.

Acromioclavicular Joint: Normal. Type II acromion. No
subacromial/subdeltoid bursal fluid.

Glenohumeral Joint: Appears normal.

Labrum:  Intact.

Bones: No fracture or worrisome lesion. Minimal degenerative cystic
change in the humerus greater tuberosity at the rotator cuff
insertion noted.

Other: None.
IMPRESSION: Mild rotator cuff tendinopathy without tear. The exam is otherwise
negative.

## 2020-10-08 ENCOUNTER — Other Ambulatory Visit (HOSPITAL_COMMUNITY): Payer: Self-pay | Admitting: Family Medicine

## 2020-10-08 MED FILL — ZOLPIDEM TARTRATE 10 MG TAB: 10 | 90 days supply | Qty: 90 | Fill #0

## 2020-11-13 ENCOUNTER — Other Ambulatory Visit: Payer: Self-pay

## 2020-11-13 ENCOUNTER — Observation Stay (HOSPITAL_COMMUNITY)
Admission: EM | Admit: 2020-11-13 | Discharge: 2020-11-15 | Disposition: A | Discharge status: Home / Self Care | Payer: 59 | Attending: Internal Medicine | Admitting: Internal Medicine

## 2020-11-13 ENCOUNTER — Emergency Department (HOSPITAL_COMMUNITY): Payer: 59

## 2020-11-13 ENCOUNTER — Observation Stay (HOSPITAL_COMMUNITY): Payer: 59

## 2020-11-13 ENCOUNTER — Encounter (HOSPITAL_COMMUNITY): Payer: Self-pay

## 2020-11-13 DIAGNOSIS — R519 Headache, unspecified: Secondary | ICD-10-CM | POA: Diagnosis not present

## 2020-11-13 DIAGNOSIS — F329 Major depressive disorder, single episode, unspecified: Secondary | ICD-10-CM | POA: Diagnosis not present

## 2020-11-13 DIAGNOSIS — H547 Unspecified visual loss: Secondary | ICD-10-CM | POA: Diagnosis not present

## 2020-11-13 DIAGNOSIS — G459 Transient cerebral ischemic attack, unspecified: Secondary | ICD-10-CM | POA: Diagnosis not present

## 2020-11-13 DIAGNOSIS — G43011 Migraine without aura, intractable, with status migrainosus: Secondary | ICD-10-CM

## 2020-11-13 DIAGNOSIS — Z20822 Contact with and (suspected) exposure to covid-19: Secondary | ICD-10-CM | POA: Insufficient documentation

## 2020-11-13 DIAGNOSIS — Z79899 Other long term (current) drug therapy: Secondary | ICD-10-CM | POA: Insufficient documentation

## 2020-11-13 DIAGNOSIS — R400 Somnolence: Secondary | ICD-10-CM | POA: Diagnosis not present

## 2020-11-13 DIAGNOSIS — I10 Essential (primary) hypertension: Secondary | ICD-10-CM | POA: Insufficient documentation

## 2020-11-13 DIAGNOSIS — M50323 Other cervical disc degeneration at C6-C7 level: Secondary | ICD-10-CM | POA: Diagnosis not present

## 2020-11-13 DIAGNOSIS — H532 Diplopia: Secondary | ICD-10-CM | POA: Diagnosis not present

## 2020-11-13 DIAGNOSIS — Z87891 Personal history of nicotine dependence: Secondary | ICD-10-CM | POA: Diagnosis not present

## 2020-11-13 DIAGNOSIS — H539 Unspecified visual disturbance: Secondary | ICD-10-CM | POA: Diagnosis present

## 2020-11-13 DIAGNOSIS — Q283 Other malformations of cerebral vessels: Secondary | ICD-10-CM | POA: Diagnosis not present

## 2020-11-13 LAB — URINALYSIS, ROUTINE W REFLEX MICROSCOPIC
Bilirubin Urine: NEGATIVE
Glucose, UA: NEGATIVE mg/dL
Hgb urine dipstick: NEGATIVE
Ketones, ur: NEGATIVE mg/dL
Leukocytes,Ua: NEGATIVE
Nitrite: NEGATIVE
Protein, ur: NEGATIVE mg/dL
Specific Gravity, Urine: 1.009 (ref 1.005–1.030)
pH: 6 (ref 5.0–8.0)

## 2020-11-13 LAB — BASIC METABOLIC PANEL
Anion gap: 8 (ref 5–15)
BUN: 15 mg/dL (ref 6–20)
CO2: 27 mmol/L (ref 22–32)
Calcium: 8.9 mg/dL (ref 8.9–10.3)
Chloride: 102 mmol/L (ref 98–111)
Creatinine, Ser: 0.68 mg/dL (ref 0.44–1.00)
GFR, Estimated: >60 mL/min (ref >60)
Glucose, Bld: 105 mg/dL — ABNORMAL HIGH (ref 70–99)
Potassium: 4.1 mmol/L (ref 3.5–5.1)
Sodium: 137 mmol/L (ref 135–145)

## 2020-11-13 LAB — CBC
HCT: 40.3 % (ref 36.0–46.0)
Hemoglobin: 13.1 g/dL (ref 12.0–15.0)
MCH: 31.2 pg (ref 26.0–34.0)
MCHC: 32.5 g/dL (ref 30.0–36.0)
MCV: 96 fL (ref 80.0–100.0)
Platelets: 182 10*3/uL (ref 150–400)
RBC: 4.2 MIL/uL (ref 3.87–5.11)
RDW: 13.7 % (ref 11.5–15.5)
WBC: 5.9 10*3/uL (ref 4.0–10.5)
nRBC: 0 % (ref 0.0–0.2)

## 2020-11-13 LAB — PROTIME-INR
INR: 1 (ref 0.8–1.2)
Prothrombin Time: 12.4 seconds (ref 11.4–15.2)

## 2020-11-13 LAB — HIV ANTIBODY (ROUTINE TESTING W REFLEX): HIV Screen 4th Generation wRfx: NONREACTIVE

## 2020-11-13 LAB — VITAMIN B12: Vitamin B-12: 373 pg/mL (ref 180–914)

## 2020-11-13 LAB — CBG MONITORING, ED: Glucose-Capillary: 75 mg/dL (ref 70–99)

## 2020-11-13 LAB — TROPONIN I (HIGH SENSITIVITY): Troponin I (High Sensitivity): <2 ng/L (ref <18)

## 2020-11-13 LAB — SARS CORONAVIRUS 2 BY RT PCR (HOSPITAL ORDER, PERFORMED IN ~~LOC~~ HOSPITAL LAB): SARS Coronavirus 2: NEGATIVE

## 2020-11-13 LAB — TSH: TSH: 2.602 u[IU]/mL (ref 0.350–4.500)

## 2020-11-13 MED ORDER — VORTIOXETINE HBR 5 MG PO TABS
10.0000 mg | ORAL_TABLET | Freq: Every day | ORAL | Status: DC
  Administered 2020-11-14 – 2020-11-15 (×2): 10 mg via ORAL
  Filled 2020-11-13 (×2): qty 2

## 2020-11-13 MED ORDER — STROKE: EARLY STAGES OF RECOVERY BOOK
Freq: Once | Status: AC
  Filled 2020-11-13 (×2): qty 1

## 2020-11-13 MED ORDER — ENOXAPARIN SODIUM 40 MG/0.4ML ~~LOC~~ SOLN
40.0000 mg | SUBCUTANEOUS | Status: DC
  Administered 2020-11-14: 40 mg via SUBCUTANEOUS
  Filled 2020-11-13 (×3): qty 0.4

## 2020-11-13 MED ORDER — ACETAMINOPHEN 650 MG RE SUPP: 650.0000 mg | RECTAL | Status: DC | PRN

## 2020-11-13 MED ORDER — IOHEXOL 350 MG/ML SOLN
100.0000 mL | Freq: Once | INTRAVENOUS | Status: AC | PRN
  Administered 2020-11-13: 100 mL via INTRAVENOUS

## 2020-11-13 MED ORDER — ACETAMINOPHEN 160 MG/5ML PO SOLN: 650.0000 mg | ORAL | Status: DC | PRN

## 2020-11-13 MED ORDER — VITAMIN B-12 1000 MCG PO TABS
1000.0000 ug | ORAL_TABLET | Freq: Every day | ORAL | Status: DC
  Administered 2020-11-14 – 2020-11-15 (×2): 1000 ug via ORAL
  Filled 2020-11-13 (×3): qty 1

## 2020-11-13 MED ORDER — ASPIRIN 300 MG RE SUPP: 300.0000 mg | Freq: Every day | RECTAL | Status: DC

## 2020-11-13 MED ORDER — ZOLPIDEM TARTRATE 5 MG PO TABS
5.0000 mg | ORAL_TABLET | Freq: Every evening | ORAL | Status: DC | PRN
  Administered 2020-11-13 – 2020-11-14 (×2): 5 mg via ORAL
  Filled 2020-11-13 (×2): qty 1

## 2020-11-13 MED ORDER — METOPROLOL SUCCINATE ER 50 MG PO TB24
25.0000 mg | ORAL_TABLET | Freq: Every day | ORAL | Status: DC
  Administered 2020-11-14 – 2020-11-15 (×2): 25 mg via ORAL
  Filled 2020-11-13 (×2): qty 1

## 2020-11-13 MED ORDER — ASPIRIN 325 MG PO TABS
325.0000 mg | ORAL_TABLET | Freq: Every day | ORAL | Status: DC
  Administered 2020-11-13 – 2020-11-15 (×3): 325 mg via ORAL
  Filled 2020-11-13 (×3): qty 1

## 2020-11-13 MED ORDER — ZINC SULFATE 220 (50 ZN) MG PO CAPS
220.0000 mg | ORAL_CAPSULE | ORAL | Status: DC
Start: 2020-11-14 — End: 2020-11-15
  Administered 2020-11-14: 220 mg via ORAL
  Filled 2020-11-13 (×2): qty 1

## 2020-11-13 MED ORDER — SENNOSIDES-DOCUSATE SODIUM 8.6-50 MG PO TABS: 1.0000 | ORAL_TABLET | Freq: Every evening | ORAL | Status: DC | PRN

## 2020-11-13 MED ORDER — ONDANSETRON HCL 4 MG/2ML IJ SOLN
4.0000 mg | Freq: Four times a day (QID) | INTRAMUSCULAR | Status: DC | PRN
  Administered 2020-11-14: 4 mg via INTRAVENOUS
  Filled 2020-11-13: qty 2

## 2020-11-13 MED ORDER — VITAMIN D 25 MCG (1000 UNIT) PO TABS
1000.0000 [IU] | ORAL_TABLET | Freq: Every day | ORAL | Status: DC
  Administered 2020-11-14 – 2020-11-15 (×2): 1000 [IU] via ORAL
  Filled 2020-11-13 (×3): qty 1

## 2020-11-13 MED ORDER — SODIUM CHLORIDE 0.9 % IV SOLN: INTRAVENOUS | Status: AC

## 2020-11-13 MED ORDER — ACETAMINOPHEN 325 MG PO TABS
650.0000 mg | ORAL_TABLET | ORAL | Status: DC | PRN
  Administered 2020-11-13 – 2020-11-14 (×3): 650 mg via ORAL
  Filled 2020-11-13 (×4): qty 2

## 2020-11-13 NOTE — ED Triage Notes (Signed)
Pt arrived via walk in, states since she woke up this morning, she has been feeling lightheaded, double vision, mild headache.

## 2020-11-13 NOTE — ED Provider Notes (Signed)
I provided a substantive portion of the care of this patient.  I personally performed the entirety of the history for this encounter.  EKG Interpretation  Date/Time:  Tuesday November 13 2020 07:44:27 EST Ventricular Rate:  74 PR Interval:    QRS Duration: 94 QT Interval:  366 QTC Calculation: 406 R Axis:   4 Text Interpretation: Sinus rhythm RSR' in V1 or V2, right VCD or RVH no sig change from previous Confirmed by Charlesetta Shanks [phone]) on 11/13/2020 1:43:07 PM  Patient reports that she awoke this morning and felt somewhat off balance.  She was going to get ready for work and noted that she stumbled to the side several times and had to steady herself.  She reports she just felt sort of off balance.  She however did not fall.  She then noted that she was heading to the kitchen that her left leg briefly gave way.  Again this did not cause a fall she was able to steady herself.  She did not note that the leg was persistently dragging but seemed perhaps somewhat weak.  She is able to continue her preparations for work.  While driving to work, she suddenly experienced "triple vision".  She reports there was 3 of everything and if she closed one eye it was better.  She however then found herself unexpectedly in the left hand lane.  That symptom lasted about 15 minutes.  The time of her evaluation emergency department, symptoms are resolved.  Alert and nontoxic.  Mental status clear.  Normal pupillary responses with normal consensual response.  Extra ocular motions normal.  Normal finger-nose exam bilaterally.  Normal heel shin exam.  Normal motor strength lower extremities.   Charlesetta Shanks, MD 11/13/20 1345

## 2020-11-13 NOTE — ED Notes (Signed)
Patient transported to MRI 

## 2020-11-13 NOTE — H&P (Signed)
History and Physical    [name] ZJQ:[id number] DOB: 01/19/1961 DOA: 11/13/2020  PCP: Maurice Small, MD   Patient coming from: Home  I have personally briefly reviewed patient's old medical records in [place]  Chief Complaint: Transient left-sided weakness, blurred vision/diplopia, dizziness.  HPI: [name] is a 60 y.o. female with medical history significant of anxiety/depression, hypertension, hyperlipidemia, history of migraine, Raynaud's syndrome, history of supraventricular tachycardia, IBS and hot flashes; who presented to the emergency department secondary to transient left-sided weakness, dizziness and visual defects. By the time of my evaluation her symptoms were completely resolved and she felt back to normal. Patient reported symptoms were present for couple hours, she last time seen well was after waking up this morning. Patient expressed having episode after showering were her left leg gave away, but she didn't thought too much about it at the time (even felt slightly weak after event for while); she also reported while driving to work experienced diplopia and triple vision; also experienced a period of time according to her 20 minutes or so, of transient amnesia. Patient denies fever, chills, chest pain, nausea, vomiting, dysuria, hematuria, melena, hematochezia, or any other complaints.  Of note, patient is actively using hormonal therapy. She reports being the first time that she experienced any of the symptoms.  ED Course: CT scan of the head without acute abnormality; negative MRI. Normal blood work. As mentioned above at the time of my examination patient symptoms completely resolved. Neurology service was consulted with recommendations to admit for TIA work-up.  Review of Systems: As per HPI otherwise all other systems reviewed and are negative.   Past Medical History:  Diagnosis Date  . Anxiety   . Arthritis   . Back pain   . Chronic rhinitis    . Complication of anesthesia   . Depression   . Family history of colonic polyps   . Hepatic hemangioma   . Hyperlipidemia   . IBS (irritable bowel syndrome)   . Insomnia   . Migraine   . Osteoarthritis, foot, localized    left  . Perimenopause   . Peripheral neuropathy   . PONV (postoperative nausea and vomiting)   . Raynauds syndrome   . Seasonal allergies   . Von Willebrand's disease ([place])    bruises easily    Past Surgical History:  Procedure Laterality Date  . BONE EXOSTOSIS EXCISION Left 02/24/2019   Procedure: Dorsal Exostectomy;  Surgeon: Wylene Simmer, MD;  Location: [place];  Service: Orthopedics;  Laterality: Left;  . CESAREAN SECTION    . laparoscopy removal of right ovarian cyst    . left knee arthroscopy    . NOVASURE ABLATION  12/2005  . pylonidal cyst removal    . SEPTOPLASTY      Social History  reports that she has quit smoking. Her smoking use included cigarettes. She quit after 10.00 years of use. She has never used smokeless tobacco. She reports current alcohol use of about 7.0 standard drinks of alcohol per week. She reports that she does not use drugs.  Allergies  Allergen Reactions  . Levaquin [Levofloxacin In D5w] Other (See Comments)    Tendonitis in shoulder, severe yeast infections  . Sulfa Antibiotics Other (See Comments)    Unknown reaction - childhood allergy    Family History  Problem Relation Age of Onset  . Hypertension Mother   . Arthritis Mother   . Hypertension Father   . Heart disease Father   .  Lupus Sister   . Other Sister        mythensia gravis    Prior to Admission medications   Medication Sig Start Date End Date Taking? Authorizing Provider  cholecalciferol (VITAMIN D3) 25 MCG (1000 UNIT) tablet Take 1,000 Units by mouth daily.   Yes [provider]  Cyanocobalamin (VITAMIN B12) 1000 MCG TBCR Take 1 tablet by mouth daily.   Yes [provider]  diclofenac Sodium (VOLTAREN) 1 % GEL  Apply 2 g topically as needed. 02/20/20  Yes [provider]  ipratropium (ATROVENT) 0.06 % nasal spray Place 2 sprays into both nostrils as needed for rhinitis.   Yes [provider]  metoprolol succinate (TOPROL XL) 25 MG 24 hr tablet Take 1 tablet (25 mg total) by mouth daily. 03/08/20  Yes Nahser, Wonda Cheng, MD  Omega 3 340 MG CPDR Take 2 capsules by mouth daily.   Yes [provider]  Probiotic Product (PROBIOTIC PO) Take 1 capsule by mouth daily.   Yes [provider]  progesterone (PROMETRIUM) 100 MG capsule Take 1 capsule (100 mg total) by mouth at bedtime. 09/01/18  Yes Fontaine, Belinda Block, MD  TRINTELLIX 10 MG TABS tablet Take 10 mg by mouth daily. 07/23/20  Yes [provider]  VIVELLE-DOT 0.1 MG/24HR patch Place 1 patch (0.1 mg total) onto the skin 2 (two) times a week. Patient taking differently: Place 1 patch onto the skin 2 (two) times a week. Sundays and Wednesdays 09/02/18  Yes Fontaine, Belinda Block, MD  Zinc 50 MG TABS Take 1 tablet by mouth every Monday, Wednesday, and Friday.   Yes [provider]  zolpidem (AMBIEN) 10 MG tablet Take 1 tablet (10 mg total) by mouth at bedtime as needed for sleep. Patient taking differently: Take 10 mg by mouth at bedtime. 08/22/14  Yes Fontaine, Belinda Block, MD    Physical Exam: Vitals:   11/13/20 1445 11/13/20 1531 11/13/20 1600 11/13/20 1645  BP:  134/69 (!) 132/94 132/77  Pulse:  75 81 87  Resp: 20 18 17 12   Temp:      TempSrc:      SpO2:  98% 97% 100%  Weight:      Height:        Constitutional: NAD, calm, comfortable; in no acute distress. Vitals:   11/13/20 1445 11/13/20 1531 11/13/20 1600 11/13/20 1645  BP:  134/69 (!) 132/94 132/77  Pulse:  75 81 87  Resp: 20 18 17 12   Temp:      TempSrc:      SpO2:  98% 97% 100%  Weight:      Height:       Eyes: PERRL, lids and conjunctivae normal; no icterus, nystagmus. ENMT: Mucous membranes are moist. Posterior pharynx clear of any  exudate or lesions.Normal dentition.  Neck: normal, supple, no masses, no thyromegaly, no JVD. Respiratory: clear to auscultation bilaterally, no wheezing, no crackles. Normal respiratory effort. No accessory muscle use.  Cardiovascular: Regular rate and rhythm, no murmurs / rubs / gallops. No extremity edema. 2+ pedal pulses. No carotid bruits.  Abdomen: no tenderness, no masses palpated. No hepatosplenomegaly. Bowel sounds positive.  Musculoskeletal: no clubbing / cyanosis. No joint deformity upper and lower extremities. Good ROM, no contractures. Normal muscle tone.  Skin: no rashes, lesions, ulcers. No induration Neurologic: CN 2-12 grossly intact. Sensation intact, DTR normal. Strength 5/5 in all 4.  Psychiatric: Normal judgment and insight. Alert and oriented x 3. Normal mood.   Labs on  Admission: I have personally reviewed following labs and imaging studies  CBC: Recent Labs  Lab 11/13/20 0811  WBC 5.9  HGB 13.1  HCT 40.3  MCV 96.0  PLT 625    Basic Metabolic Panel: Recent Labs  Lab 11/13/20 0811  NA 137  K 4.1  CL 102  CO2 27  GLUCOSE 105*  BUN 15  CREATININE 0.68  CALCIUM 8.9    GFR: Estimated Creatinine Clearance: 85.9 mL/min (by C-G formula based on SCr of 0.68 mg/dL).  Liver Function Tests: No results for input(s): AST, ALT, ALKPHOS, BILITOT, PROT, ALBUMIN in the last 168 hours.  Urine analysis:    Component Value Date/Time   COLORURINE YELLOW 11/13/2020 [place] 11/13/2020 1041   LABSPEC 1.009 11/13/2020 1041   PHURINE 6.0 11/13/2020 1041   GLUCOSEU NEGATIVE 11/13/2020 1041   [place] 11/13/2020 [place] 11/13/2020 [place] 11/13/2020 1041   PROTEINUR NEGATIVE 11/13/2020 1041   UROBILINOGEN 0.2 08/22/2014 1131   NITRITE NEGATIVE 11/13/2020 1041   [place] 11/13/2020 1041    Radiological Exams on Admission: CT Angio Head W or Wo Contrast  Result Date: 11/13/2020 CLINICAL  DATA:  Vision loss, TIA workup. EXAM: CT ANGIOGRAPHY HEAD AND NECK TECHNIQUE: Multidetector CT imaging of the head and neck was performed using the standard protocol during bolus administration of intravenous contrast. Multiplanar CT image reconstructions and MIPs were obtained to evaluate the vascular anatomy. Carotid stenosis measurements (when applicable) are obtained utilizing NASCET criteria, using the distal internal carotid diameter as the denominator. CONTRAST:  135mL OMNIPAQUE IOHEXOL 350 MG/ML SOLN COMPARISON:  CT head and MRI from the same day. FINDINGS: CTA NECK FINDINGS Aortic arch: Imaged portion shows no evidence of aneurysm or dissection. No significant stenosis of the major arch vessel origins. Right carotid system: No evidence of dissection, stenosis (50% or greater) or occlusion. Left carotid system: No evidence of dissection, stenosis (50% or greater) or occlusion. Vertebral arteries: Codominant. No evidence of dissection, stenosis (50% or greater) or occlusion. Skeleton: Mild degenerative disc disease at C6-C7 and multilevel facet arthropathy. No acute abnormality. Other neck: No mass or suspicious adenopathy. Scattered small cervical chain lymph nodes. Upper chest: Negative. Review of the MIP images confirms the above findings CTA HEAD FINDINGS Anterior circulation: No significant stenosis, proximal occlusion, aneurysm, or vascular malformation. Posterior circulation: No significant stenosis, proximal occlusion, aneurysm, or vascular malformation. Left fetal type PCA with hypoplastic left P1 PCA, anatomic variant. Venous sinuses: As permitted by contrast timing, patent. Anatomic variants: Left fetal type PCA with hypoplastic left P1 PCA. Review of the MIP images confirms the above findings IMPRESSION: No large vessel occlusion or hemodynamically significant proximal stenosis in the head or neck. Electronically Signed   By: Margaretha Sheffield MD   On: 11/13/2020 15:41   CT Head Wo  Contrast  Result Date: 11/13/2020 CLINICAL DATA:  Lightheaded, double vision and mild headache this morning which has since resolved. Currently with a slight headache. EXAM: CT HEAD WITHOUT CONTRAST TECHNIQUE: Contiguous axial images were obtained from the base of the skull through the vertex without intravenous contrast. COMPARISON:  None. FINDINGS: Brain: Choose 1 Vascular: No hyperdense vessel or unexpected calcification. Skull: Normal. Negative for fracture or focal lesion. Sinuses/Orbits: No acute finding. Other: None. IMPRESSION: No acute intracranial pathology. Electronically Signed   By: Dahlia Bailiff MD   On: 11/13/2020 11:30   CT Angio Neck W and/or Wo Contrast  Result Date: 11/13/2020 CLINICAL  DATA:  Vision loss, TIA workup. EXAM: CT ANGIOGRAPHY HEAD AND NECK TECHNIQUE: Multidetector CT imaging of the head and neck was performed using the standard protocol during bolus administration of intravenous contrast. Multiplanar CT image reconstructions and MIPs were obtained to evaluate the vascular anatomy. Carotid stenosis measurements (when applicable) are obtained utilizing NASCET criteria, using the distal internal carotid diameter as the denominator. CONTRAST:  133mL OMNIPAQUE IOHEXOL 350 MG/ML SOLN COMPARISON:  CT head and MRI from the same day. FINDINGS: CTA NECK FINDINGS Aortic arch: Imaged portion shows no evidence of aneurysm or dissection. No significant stenosis of the major arch vessel origins. Right carotid system: No evidence of dissection, stenosis (50% or greater) or occlusion. Left carotid system: No evidence of dissection, stenosis (50% or greater) or occlusion. Vertebral arteries: Codominant. No evidence of dissection, stenosis (50% or greater) or occlusion. Skeleton: Mild degenerative disc disease at C6-C7 and multilevel facet arthropathy. No acute abnormality. Other neck: No mass or suspicious adenopathy. Scattered small cervical chain lymph nodes. Upper chest: Negative. Review of the  MIP images confirms the above findings CTA HEAD FINDINGS Anterior circulation: No significant stenosis, proximal occlusion, aneurysm, or vascular malformation. Posterior circulation: No significant stenosis, proximal occlusion, aneurysm, or vascular malformation. Left fetal type PCA with hypoplastic left P1 PCA, anatomic variant. Venous sinuses: As permitted by contrast timing, patent. Anatomic variants: Left fetal type PCA with hypoplastic left P1 PCA. Review of the MIP images confirms the above findings IMPRESSION: No large vessel occlusion or hemodynamically significant proximal stenosis in the head or neck. Electronically Signed   By: Margaretha Sheffield MD   On: 11/13/2020 15:41   MR BRAIN WO CONTRAST  Result Date: 11/13/2020 CLINICAL DATA:  Vision loss.  Diplopia. EXAM: MRI HEAD WITHOUT CONTRAST TECHNIQUE: Multiplanar, multiecho pulse sequences of the brain and surrounding structures were obtained without intravenous contrast. COMPARISON:  CT head 11/13/2020 FINDINGS: Brain: Ventricle size and cerebral volume within normal limits. Several small hyperintensities in the left frontal lobe. Brainstem and cerebellum normal. Negative for acute infarct.  Negative for hemorrhage or mass. Vascular: Normal arterial flow voids. Skull and upper cervical spine: Negative Sinuses/Orbits: Paranasal sinuses clear negative orbit Other: None IMPRESSION: No acute abnormality. Mild white matter changes most likely due to chronic ischemia. Electronically Signed   By: Franchot Gallo M.D.   On: 11/13/2020 13:13    EKG: Independently reviewed. Sinus rhythm, no acute ischemic changes, normal QT.  Assessment/Plan 1-TIA (transient ischemic attack) -Negative CT and MRI -Neurology service has recommended CT angiogram of head and neck -Patient will be transferred to [facility] to complete TIA work-up and have evaluation by neurology service. -Aspirin has been started for secondary prevention -Checking A1c and lipid  panel -will also check TSH, B12 and Vit D levels. -telemetry monitoring ordered.  2-HTN -stable -will continue metoprolol  3-hx of supraventricular tachycardia -normal sinus rhythm appreciated -will monitor on telemetry -continue metoprolol -outpatient follow up by Dr. Cathie Olden   4-depression -continue Trintellix   5-Menopause -holding hormonal therapy acutely while inpatient   DVT prophylaxis: Lovenox Code Status:   Full Code Family Communication:  No family at bedside Disposition Plan:   Patient is from:  home  Anticipated DC to:  Home   Anticipated DC date:  11/14/20  Anticipated DC barriers: Completion TIA work up.  Consults called:  Neurology (Dr. Theda Sers) Admission status:  Observation, LOS < 2 midnights, telemetry bed.  Severity of Illness: Mild illness, patient presenting with TIA; case discussed with neurology and will  place in hospital to complete TIA work up.  Barton Dubois MD [facility]  How to contact the [facility] Attending or Consulting provider [place] or covering provider during after hours [place], for this patient?   1. Check the care team in [facility] and look for a) attending/consulting TR[facility] provider listed and b) the [facility] team listed 2. Log into [email or web address] and use [place]'s universal password to access. If you do not have the password, please contact the hospital operator. 3. Locate the [facility] provider you are looking for under [facility] and page to a number that you can be directly reached. 4. If you still have difficulty reaching the provider, please page the [facility] (Director on Call) for the Hospitalists listed on amion for assistance.  11/13/2020, 5:39 PM

## 2020-11-13 NOTE — ED Notes (Signed)
Patient back from MRI.
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
## 2020-11-14 ENCOUNTER — Observation Stay (HOSPITAL_BASED_OUTPATIENT_CLINIC_OR_DEPARTMENT_OTHER): Payer: 59

## 2020-11-14 ENCOUNTER — Other Ambulatory Visit (HOSPITAL_COMMUNITY): Payer: 59

## 2020-11-14 ENCOUNTER — Observation Stay (HOSPITAL_COMMUNITY): Payer: 59

## 2020-11-14 ENCOUNTER — Encounter (HOSPITAL_COMMUNITY): Payer: Self-pay | Admitting: Internal Medicine

## 2020-11-14 DIAGNOSIS — I351 Nonrheumatic aortic (valve) insufficiency: Secondary | ICD-10-CM

## 2020-11-14 DIAGNOSIS — I10 Essential (primary) hypertension: Secondary | ICD-10-CM | POA: Diagnosis not present

## 2020-11-14 DIAGNOSIS — I350 Nonrheumatic aortic (valve) stenosis: Secondary | ICD-10-CM

## 2020-11-14 DIAGNOSIS — F329 Major depressive disorder, single episode, unspecified: Secondary | ICD-10-CM | POA: Diagnosis not present

## 2020-11-14 DIAGNOSIS — G459 Transient cerebral ischemic attack, unspecified: Secondary | ICD-10-CM | POA: Diagnosis not present

## 2020-11-14 DIAGNOSIS — G43511 Persistent migraine aura without cerebral infarction, intractable, with status migrainosus: Secondary | ICD-10-CM

## 2020-11-14 DIAGNOSIS — Z87891 Personal history of nicotine dependence: Secondary | ICD-10-CM | POA: Diagnosis not present

## 2020-11-14 DIAGNOSIS — G43901 Migraine, unspecified, not intractable, with status migrainosus: Secondary | ICD-10-CM | POA: Diagnosis not present

## 2020-11-14 DIAGNOSIS — R400 Somnolence: Secondary | ICD-10-CM | POA: Diagnosis not present

## 2020-11-14 DIAGNOSIS — Z20822 Contact with and (suspected) exposure to covid-19: Secondary | ICD-10-CM | POA: Diagnosis not present

## 2020-11-14 DIAGNOSIS — Z79899 Other long term (current) drug therapy: Secondary | ICD-10-CM | POA: Diagnosis not present

## 2020-11-14 LAB — ECHOCARDIOGRAM COMPLETE
AR max vel: 1.4 cm2
AV Area VTI: 1.46 cm2
AV Area mean vel: 1.32 cm2
AV Mean grad: 10 mmHg
AV Peak grad: 17.1 mmHg
Ao pk vel: 2.07 m/s
Area-P 1/2: 3.21 cm2
Height: 68 in
P 1/2 time: 647 msec
S' Lateral: 3.2 cm
Weight: 2880 oz

## 2020-11-14 LAB — LIPID PANEL
Cholesterol: 215 mg/dL — ABNORMAL HIGH (ref 0–200)
HDL: 58 mg/dL (ref >40)
LDL Cholesterol: 134 mg/dL — ABNORMAL HIGH (ref 0–99)
Total CHOL/HDL Ratio: 3.7 RATIO
Triglycerides: 116 mg/dL (ref <150)
VLDL: 23 mg/dL (ref 0–40)

## 2020-11-14 LAB — HEMOGLOBIN A1C
Hgb A1c MFr Bld: 5.2 % (ref 4.8–5.6)
Mean Plasma Glucose: 102.54 mg/dL

## 2020-11-14 MED ORDER — KETOROLAC TROMETHAMINE 60 MG/2ML IM SOLN
60.0000 mg | Freq: Once | INTRAMUSCULAR | Status: AC
  Administered 2020-11-14: 60 mg via INTRAMUSCULAR
  Filled 2020-11-14: qty 2

## 2020-11-14 MED ORDER — BUTALBITAL-APAP-CAFFEINE 50-325-40 MG PO TABS
1.0000 | ORAL_TABLET | ORAL | Status: DC | PRN
  Administered 2020-11-14: 1 via ORAL
  Filled 2020-11-14: qty 1

## 2020-11-14 MED ORDER — PROCHLORPERAZINE EDISYLATE 10 MG/2ML IJ SOLN
10.0000 mg | Freq: Once | INTRAMUSCULAR | Status: AC
  Administered 2020-11-14: 10 mg via INTRAVENOUS
  Filled 2020-11-14: qty 2

## 2020-11-14 MED ORDER — DIPHENHYDRAMINE HCL 12.5 MG/5ML PO ELIX
12.5000 mg | ORAL_SOLUTION | Freq: Once | ORAL | Status: AC
  Administered 2020-11-14: 12.5 mg via ORAL
  Filled 2020-11-14: qty 10

## 2020-11-14 MED ORDER — DEXAMETHASONE SODIUM PHOSPHATE 10 MG/ML IJ SOLN
8.0000 mg | Freq: Once | INTRAMUSCULAR | Status: AC
  Administered 2020-11-14: 8 mg via INTRAVENOUS
  Filled 2020-11-14: qty 0.8

## 2020-11-14 NOTE — Progress Notes (Signed)
EEG completed, Result pending
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
## 2020-11-14 NOTE — Progress Notes (Signed)
OT Cancellation Note  Patient Details Name: [name] MRN: [id number] DOB: 09/14/1961   Cancelled Treatment:    Reason Eval/Treat Not Completed: OT screened, no needs identified, will sign off. On OT entry, pt walking out of bathroom independently without balance concerns. Pt reports no visual deficits, B UE strength symmetrical, and that swallowing/speech is fine. Pt denies any therapy needs and reports back at baseline aside from headache.   Layla Maw 11/14/2020, 11:29 AM

## 2020-11-14 NOTE — Progress Notes (Incomplete)
Pt currently with headache. Pt took some of scheduled meds and deferred others for now commenting "they are only vitamins". Will give flouricet po for headache as soon as med available. Pt husband at bedside

## 2020-11-14 NOTE — Progress Notes (Signed)
This rn called eeg dept. EEG personnel aware of orders commenting dept has been backed up with orders. Staff approximates estimated procedure time to be within next 2 hours. This rn informed pt. Pt GCS 15 appears calm. Pt reports was able to take a nap, reports headache much improved from severe to 2/10

## 2020-11-14 NOTE — Progress Notes (Signed)
OT Cancellation Note  Patient Details Name: [name] MRN: [id number] DOB: 06/22/1961   Cancelled Treatment:    Reason Eval/Treat Not Completed: Patient at procedure or test/ unavailable. Pt off unit for echo this AM. Will follow-up for OT eval as appropriate.  Layla Maw 11/14/2020, 8:24 AM

## 2020-11-14 NOTE — Progress Notes (Addendum)
PT Cancellation Note and Discharge  Patient Details Name: [name] MRN: [id number] DOB: 07/30/1961   Cancelled Treatment:    Reason Eval/Treat Not Completed: PT screened, no needs identified, will sign off.  Discussed pt case with OT who states pt declined eval, and is back to baseline of function and independently mobilizing around her room. If pt decides she would like a formal PT evaluation, I will be happy to come back to her. Please page me at [phone] if any further PT needs arise.    Thelma Comp 11/14/2020, 12:10 PM   Rolinda Roan, PT, DPT Acute Rehabilitation Services Pager: [phone] Office: [phone]

## 2020-11-14 NOTE — Progress Notes (Signed)
SLP Cancellation Note  Patient Details Name: [name] MRN: [id number] DOB: 1961/02/08   Cancelled treatment:       Reason Eval/Treat Not Completed: SLP screened, no needs identified, will sign off. Per EMR, imaging was negative for acute infarct and pt has reported resolution of symptoms as well as any changes in speech or swallowing. A formal evaluation does not appear to be clinically indicated at this time.   Jazmyn Offner I. Hardin Negus, [place], [place] Office number [phone] Pager [phone]  [name] 11/14/2020, 3:13 PM

## 2020-11-14 NOTE — Progress Notes (Signed)
Echocardiogram 2D Echocardiogram has been performed.  Oneal Deputy Serafina Topham 11/14/2020, 8:41 AM

## 2020-11-14 NOTE — Progress Notes (Signed)
PROGRESS NOTE    [name]  WCB:[id number] DOB: 05/07/1961 DOA: 11/13/2020 PCP: Maurice Small, MD   Chief Complain: Transient left-sided weakness, dizziness, visual defects  Brief Narrative:  [name] a 60 y.o.femalewith medical history significant ofanxiety/depression, hypertension, hyperlipidemia, history of migraine, Raynaud's syndrome, history of supraventricular tachycardia,IBS and hot flashes;who presented to the emergency department secondary to transient left-sided weakness, dizziness and visual defects.  Initially TIA/CVA was suspected but is to work on negative.  Her symptoms are attributed to migraine.  Neurology is following  Assessment & Plan:   Active Problems:   TIA (transient ischemic attack)    1-suspected TIA /status migrainosus -Negative CT and MRI - CT angiogram of head and neck did not show LVO -Hb A1c normal.  LDL of 134, patient does not want to be started on a statin and agrees with monitoring as an outpatient.. -Her symptoms are not really associated with TIA or CVA.  This is secondary to status migrainosus. -  She was given multiple doses of migraine cocktail during this hospitalization.  Neurology was following.  She will be discharged on Maxalt, valproic acid.  2-HTN -stable -will continue metoprolol on dC  3-hx of supraventricular tachycardia -normal sinus rhythm  -continue metoprolol -outpatient follow up by Dr. Cathie Olden   4-depression -continue Trintellix          Consultants: Neurology Procedures:None  Antimicrobials:  Anti-infectives (From admission, onward)   None      Subjective: Patient seen and examined the bedside this morning.  She was complaining of headache.  Denies any weakness or visual defects.  Objective: Vitals:   11/14/20 0108 11/14/20 0122 11/14/20 0521 11/14/20 1235  BP: (!) 99/57 (!) 99/57 109/74 (!) 90/52  Pulse: 79 68 66 61  Resp: 18 18 18 18   Temp: 98 F (36.7 C) 98 F (36.7 C)  97.7 F (36.5 C) 98.4 F (36.9 C)  TempSrc: Oral Oral Oral Oral  SpO2: 97%  100% 97%  Weight:      Height:        Intake/Output Summary (Last 24 hours) at 11/14/2020 1512 Last data filed at 11/14/2020 0522 Gross per 24 hour  Intake 509.22 ml  Output --  Net 509.22 ml   Filed Weights   11/13/20 1019  Weight: 81.6 kg    Examination:  General exam: Appears calm and comfortable ,Not in distress,average built HEENT:PERRL,Oral mucosa moist, Ear/Nose normal on gross exam Respiratory system: Bilateral equal air entry, normal vesicular breath sounds, no wheezes or crackles  Cardiovascular system: S1 & S2 heard, RRR. No JVD, murmurs, rubs, gallops or clicks. No pedal edema. Gastrointestinal system: Abdomen is nondistended, soft and nontender. No organomegaly or masses felt. Normal bowel sounds heard. Central nervous system: Alert and oriented. No focal neurological deficits. Extremities: No edema, no clubbing ,no cyanosis, distal peripheral pulses palpable. Skin: No rashes, lesions or ulcers,no icterus ,no pallor    Data Reviewed: I have personally reviewed following labs and imaging studies  CBC: Recent Labs  Lab 11/13/20 0811  WBC 5.9  HGB 13.1  HCT 40.3  MCV 96.0  PLT 616   Basic Metabolic Panel: Recent Labs  Lab 11/13/20 0811  NA 137  K 4.1  CL 102  CO2 27  GLUCOSE 105*  BUN 15  CREATININE 0.68  CALCIUM 8.9   GFR: Estimated Creatinine Clearance: 85.9 mL/min (by C-G formula based on SCr of 0.68 mg/dL). Liver Function Tests: No results for input(s): AST, ALT, ALKPHOS, BILITOT, PROT, ALBUMIN in  the last 168 hours. No results for input(s): LIPASE, AMYLASE in the last 168 hours. No results for input(s): AMMONIA in the last 168 hours. Coagulation Profile: Recent Labs  Lab 11/13/20 1047  INR 1.0   Cardiac Enzymes: No results for input(s): CKTOTAL, CKMB, CKMBINDEX, TROPONINI in the last 168 hours. BNP (last 3 results) No results for input(s): PROBNP in the last  8760 hours. HbA1C: Recent Labs    11/14/20 0408  HGBA1C 5.2   CBG: Recent Labs  Lab 11/13/20 1021  GLUCAP 75   Lipid Profile: Recent Labs    11/14/20 0408  CHOL 215*  HDL 58  LDLCALC 134*  TRIG 116  CHOLHDL 3.7   Thyroid Function Tests: Recent Labs    11/13/20 1443  TSH 2.602   Anemia Panel: Recent Labs    11/13/20 1443  VITAMINB12 373   Sepsis Labs: No results for input(s): PROCALCITON, LATICACIDVEN in the last 168 hours.  Recent Results (from the past 240 hour(s))  SARS Coronavirus 2 by RT PCR (hospital order, performed in [facility] hospital lab) Nasopharyngeal Nasopharyngeal Swab     Status: None   Collection Time: 11/13/20  2:43 PM   Specimen: Nasopharyngeal Swab  Result Value Ref Range Status   SARS Coronavirus 2 NEGATIVE NEGATIVE Final    Comment: (NOTE) SARS-CoV-2 target nucleic acids are NOT DETECTED.  The SARS-CoV-2 RNA is generally detectable in upper and lower respiratory specimens during the acute phase of infection. The lowest concentration of SARS-CoV-2 viral copies this assay can detect is 250 copies / mL. A negative result does not preclude SARS-CoV-2 infection and should not be used as the sole basis for treatment or other patient management decisions.  A negative result may occur with improper specimen collection / handling, submission of specimen other than nasopharyngeal swab, presence of viral mutation(s) within the areas targeted by this assay, and inadequate number of viral copies (<250 copies / mL). A negative result must be combined with clinical observations, patient history, and epidemiological information.  Fact Sheet for Patients:   StrictlyIdeas.no  Fact Sheet for Healthcare Providers: BankingDealers.co.za  This test is not yet approved or  cleared by the [place] FDA and has been authorized for detection and/or diagnosis of SARS-CoV-2 by FDA under an Emergency Use  Authorization (EUA).  This EUA will remain in effect (meaning this test can be used) for the duration of the COVID-19 declaration under Section 564(b)(1) of the Act, 21 U.S.C. section 360bbb-3(b)(1), unless the authorization is terminated or revoked sooner.  Performed at [facility], [place] [address] [place], [place] [address]          Radiology Studies: CT Angio Head W or Wo Contrast  Result Date: 11/13/2020 CLINICAL DATA:  Vision loss, TIA workup. EXAM: CT ANGIOGRAPHY HEAD AND NECK TECHNIQUE: Multidetector CT imaging of the head and neck was performed using the standard protocol during bolus administration of intravenous contrast. Multiplanar CT image reconstructions and MIPs were obtained to evaluate the vascular anatomy. Carotid stenosis measurements (when applicable) are obtained utilizing NASCET criteria, using the distal internal carotid diameter as the denominator. CONTRAST:  167mL OMNIPAQUE IOHEXOL 350 MG/ML SOLN COMPARISON:  CT head and MRI from the same day. FINDINGS: CTA NECK FINDINGS Aortic arch: Imaged portion shows no evidence of aneurysm or dissection. No significant stenosis of the major arch vessel origins. Right carotid system: No evidence of dissection, stenosis (50% or greater) or occlusion. Left carotid system: No evidence of dissection, stenosis (50% or greater)  or occlusion. Vertebral arteries: Codominant. No evidence of dissection, stenosis (50% or greater) or occlusion. Skeleton: Mild degenerative disc disease at C6-C7 and multilevel facet arthropathy. No acute abnormality. Other neck: No mass or suspicious adenopathy. Scattered small cervical chain lymph nodes. Upper chest: Negative. Review of the MIP images confirms the above findings CTA HEAD FINDINGS Anterior circulation: No significant stenosis, proximal occlusion, aneurysm, or vascular malformation. Posterior circulation: No significant stenosis, proximal occlusion, aneurysm, or vascular  malformation. Left fetal type PCA with hypoplastic left P1 PCA, anatomic variant. Venous sinuses: As permitted by contrast timing, patent. Anatomic variants: Left fetal type PCA with hypoplastic left P1 PCA. Review of the MIP images confirms the above findings IMPRESSION: No large vessel occlusion or hemodynamically significant proximal stenosis in the head or neck. Electronically Signed   By: Margaretha Sheffield MD   On: 11/13/2020 15:41   CT Head Wo Contrast  Result Date: 11/13/2020 CLINICAL DATA:  Lightheaded, double vision and mild headache this morning which has since resolved. Currently with a slight headache. EXAM: CT HEAD WITHOUT CONTRAST TECHNIQUE: Contiguous axial images were obtained from the base of the skull through the vertex without intravenous contrast. COMPARISON:  None. FINDINGS: Brain: Choose 1 Vascular: No hyperdense vessel or unexpected calcification. Skull: Normal. Negative for fracture or focal lesion. Sinuses/Orbits: No acute finding. Other: None. IMPRESSION: No acute intracranial pathology. Electronically Signed   By: Dahlia Bailiff MD   On: 11/13/2020 11:30   CT Angio Neck W and/or Wo Contrast  Result Date: 11/13/2020 CLINICAL DATA:  Vision loss, TIA workup. EXAM: CT ANGIOGRAPHY HEAD AND NECK TECHNIQUE: Multidetector CT imaging of the head and neck was performed using the standard protocol during bolus administration of intravenous contrast. Multiplanar CT image reconstructions and MIPs were obtained to evaluate the vascular anatomy. Carotid stenosis measurements (when applicable) are obtained utilizing NASCET criteria, using the distal internal carotid diameter as the denominator. CONTRAST:  139mL OMNIPAQUE IOHEXOL 350 MG/ML SOLN COMPARISON:  CT head and MRI from the same day. FINDINGS: CTA NECK FINDINGS Aortic arch: Imaged portion shows no evidence of aneurysm or dissection. No significant stenosis of the major arch vessel origins. Right carotid system: No evidence of dissection,  stenosis (50% or greater) or occlusion. Left carotid system: No evidence of dissection, stenosis (50% or greater) or occlusion. Vertebral arteries: Codominant. No evidence of dissection, stenosis (50% or greater) or occlusion. Skeleton: Mild degenerative disc disease at C6-C7 and multilevel facet arthropathy. No acute abnormality. Other neck: No mass or suspicious adenopathy. Scattered small cervical chain lymph nodes. Upper chest: Negative. Review of the MIP images confirms the above findings CTA HEAD FINDINGS Anterior circulation: No significant stenosis, proximal occlusion, aneurysm, or vascular malformation. Posterior circulation: No significant stenosis, proximal occlusion, aneurysm, or vascular malformation. Left fetal type PCA with hypoplastic left P1 PCA, anatomic variant. Venous sinuses: As permitted by contrast timing, patent. Anatomic variants: Left fetal type PCA with hypoplastic left P1 PCA. Review of the MIP images confirms the above findings IMPRESSION: No large vessel occlusion or hemodynamically significant proximal stenosis in the head or neck. Electronically Signed   By: Margaretha Sheffield MD   On: 11/13/2020 15:41   MR BRAIN WO CONTRAST  Result Date: 11/13/2020 CLINICAL DATA:  Vision loss.  Diplopia. EXAM: MRI HEAD WITHOUT CONTRAST TECHNIQUE: Multiplanar, multiecho pulse sequences of the brain and surrounding structures were obtained without intravenous contrast. COMPARISON:  CT head 11/13/2020 FINDINGS: Brain: Ventricle size and cerebral volume within normal limits. Several small hyperintensities in the left  frontal lobe. Brainstem and cerebellum normal. Negative for acute infarct.  Negative for hemorrhage or mass. Vascular: Normal arterial flow voids. Skull and upper cervical spine: Negative Sinuses/Orbits: Paranasal sinuses clear negative orbit Other: None IMPRESSION: No acute abnormality. Mild white matter changes most likely due to chronic ischemia. Electronically Signed   By: Franchot Gallo M.D.   On: 11/13/2020 13:13   ECHOCARDIOGRAM COMPLETE  Result Date: 11/14/2020    ECHOCARDIOGRAM REPORT   Patient Name:   [name] Date of Exam: 11/14/2020 Medical Rec #:  [id number]          Height:       68.0 in Accession #:    [id number]         Weight:       180.0 lb Date of Birth:  [birth year]          BSA:          1.954 m Patient Age:    33 years           BP:           109/74 mmHg Patient Gender: F                  HR:           64 bpm. Exam Location:  Inpatient Procedure: 2D Echo, Color Doppler and Cardiac Doppler Indications:    TIA  History:        Patient has no prior history of Echocardiogram examinations.                 Risk Factors:Hypertension.  Sonographer:    Raquel Sarna Senior RDCS Referring Phys: [place]  1. Left ventricular ejection fraction, by estimation, is 60 to 65%. The left ventricle has normal function. The left ventricle has no regional wall motion abnormalities. Left ventricular diastolic parameters were normal.  2. Right ventricular systolic function is normal. The right ventricular size is normal. Tricuspid regurgitation signal is inadequate for assessing PA pressure.  3. The mitral valve is degenerative. Trivial mitral valve regurgitation. Moderate mitral annular calcification.  4. The aortic valve has an indeterminant number of cusps. Aortic valve regurgitation is mild. Mild aortic valve stenosis. Vmax 2.1 m/s, MG 10 mmHg, AVA 1.5 cm^2, DI 0.46  5. The inferior vena cava is normal in size with greater than 50% respiratory variability, suggesting right atrial pressure of 3 mmHg. FINDINGS  Left Ventricle: Left ventricular ejection fraction, by estimation, is 60 to 65%. The left ventricle has normal function. The left ventricle has no regional wall motion abnormalities. The left ventricular internal cavity size was normal in size. There is  no left ventricular hypertrophy. Left ventricular diastolic parameters were normal. Right Ventricle: The right  ventricular size is normal. No increase in right ventricular wall thickness. Right ventricular systolic function is normal. Tricuspid regurgitation signal is inadequate for assessing PA pressure. Left Atrium: Left atrial size was normal in size. Right Atrium: Right atrial size was normal in size. Pericardium: There is no evidence of pericardial effusion. Mitral Valve: The mitral valve is degenerative in appearance. Moderate mitral annular calcification. Trivial mitral valve regurgitation. Tricuspid Valve: The tricuspid valve is normal in structure. Tricuspid valve regurgitation is not demonstrated. Aortic Valve: The aortic valve has an indeterminant number of cusps. Aortic valve regurgitation is mild. Aortic regurgitation PHT measures 647 msec. Mild aortic stenosis is present. Aortic valve mean gradient measures 10.0 mmHg. Aortic valve peak gradient measures 17.1 mmHg. Aortic valve  area, by VTI measures 1.46 cm. Pulmonic Valve: The pulmonic valve was not well visualized. Pulmonic valve regurgitation is not visualized. Aorta: The aortic root and ascending aorta are structurally normal, with no evidence of dilitation. Venous: The inferior vena cava is normal in size with greater than 50% respiratory variability, suggesting right atrial pressure of 3 mmHg. IAS/Shunts: The interatrial septum was not well visualized.  LEFT VENTRICLE PLAX 2D LVIDd:         4.30 cm  Diastology LVIDs:         3.20 cm  LV e' medial:    7.72 cm/s LV PW:         0.80 cm  LV E/e' medial:  9.8 LV IVS:        0.80 cm  LV e' lateral:   8.38 cm/s LVOT diam:     2.00 cm  LV E/e' lateral: 9.0 LV SV:         70 LV SV Index:   36 LVOT Area:     3.14 cm  RIGHT VENTRICLE RV S prime:     11.10 cm/s TAPSE (M-mode): 2.0 cm LEFT ATRIUM             Index       RIGHT ATRIUM           Index LA diam:        3.80 cm 1.94 cm/m  RA Area:     11.90 cm LA Vol (A2C):   37.4 ml 19.14 ml/m RA Volume:   24.20 ml  12.38 ml/m LA Vol (A4C):   36.7 ml 18.78 ml/m LA  Biplane Vol: 39.5 ml 20.21 ml/m  AORTIC VALVE AV Area (Vmax):    1.40 cm AV Area (Vmean):   1.32 cm AV Area (VTI):     1.46 cm AV Vmax:           207.00 cm/s AV Vmean:          151.000 cm/s AV VTI:            0.479 m AV Peak Grad:      17.1 mmHg AV Mean Grad:      10.0 mmHg LVOT Vmax:         92.30 cm/s LVOT Vmean:        63.500 cm/s LVOT VTI:          0.223 m LVOT/AV VTI ratio: 0.47 AI PHT:            647 msec  AORTA Ao Root diam: 3.30 cm Ao Asc diam:  3.20 cm MITRAL VALVE MV Area (PHT): 3.21 cm    SHUNTS MV Decel Time: 236 msec    Systemic VTI:  0.22 m MV E velocity: 75.80 cm/s  Systemic Diam: 2.00 cm MV A velocity: 72.80 cm/s MV E/A ratio:  1.04 Oswaldo Milian MD Electronically signed by Oswaldo Milian MD Signature Date/Time: 11/14/2020/10:35:19 AM    Final         Scheduled Meds:  aspirin  300 mg Rectal Daily   Or   aspirin  325 mg Oral Daily   cholecalciferol  1,000 Units Oral Daily   enoxaparin (LOVENOX) injection  40 mg Subcutaneous Q24H   metoprolol succinate  25 mg Oral Daily   vitamin B-12  1,000 mcg Oral Daily   vortioxetine HBr  10 mg Oral Daily   zinc sulfate  220 mg Oral Q M,W,F   Continuous Infusions:   LOS: 0 days    Time  spent: 25 mins,More than 50% of that time was spent in counseling and/or coordination of care.      Shelly Coss, MD [facility] P2/11/2020, 3:12 PM

## 2020-11-15 ENCOUNTER — Other Ambulatory Visit (HOSPITAL_COMMUNITY): Payer: Self-pay | Admitting: Internal Medicine

## 2020-11-15 ENCOUNTER — Other Ambulatory Visit: Payer: Self-pay

## 2020-11-15 DIAGNOSIS — G43901 Migraine, unspecified, not intractable, with status migrainosus: Secondary | ICD-10-CM

## 2020-11-15 DIAGNOSIS — I10 Essential (primary) hypertension: Secondary | ICD-10-CM | POA: Diagnosis not present

## 2020-11-15 DIAGNOSIS — R404 Transient alteration of awareness: Secondary | ICD-10-CM

## 2020-11-15 DIAGNOSIS — R400 Somnolence: Secondary | ICD-10-CM

## 2020-11-15 DIAGNOSIS — Z79899 Other long term (current) drug therapy: Secondary | ICD-10-CM | POA: Diagnosis not present

## 2020-11-15 DIAGNOSIS — G43511 Persistent migraine aura without cerebral infarction, intractable, with status migrainosus: Secondary | ICD-10-CM | POA: Diagnosis not present

## 2020-11-15 DIAGNOSIS — F329 Major depressive disorder, single episode, unspecified: Secondary | ICD-10-CM | POA: Diagnosis not present

## 2020-11-15 DIAGNOSIS — Z87891 Personal history of nicotine dependence: Secondary | ICD-10-CM | POA: Diagnosis not present

## 2020-11-15 DIAGNOSIS — G459 Transient cerebral ischemic attack, unspecified: Secondary | ICD-10-CM | POA: Diagnosis not present

## 2020-11-15 DIAGNOSIS — Z20822 Contact with and (suspected) exposure to covid-19: Secondary | ICD-10-CM | POA: Diagnosis not present

## 2020-11-15 MED ORDER — ONDANSETRON HCL 8 MG PO TABS: 8.0000 mg | ORAL_TABLET | Freq: Every day | ORAL | 0 refills | Status: DC | PRN

## 2020-11-15 MED ORDER — RIZATRIPTAN BENZOATE 10 MG PO TABS: 10.0000 mg | ORAL_TABLET | Freq: Once | ORAL | 0 refills | Status: DC | PRN

## 2020-11-15 MED ORDER — DIVALPROEX SODIUM ER 250 MG PO TB24: 250.0000 mg | ORAL_TABLET | Freq: Every evening | ORAL | 0 refills | Status: DC

## 2020-11-15 MED ORDER — PROCHLORPERAZINE EDISYLATE 10 MG/2ML IJ SOLN
10.0000 mg | Freq: Once | INTRAMUSCULAR | Status: AC
  Administered 2020-11-15: 10 mg via INTRAVENOUS
  Filled 2020-11-15: qty 2

## 2020-11-15 MED ORDER — DIVALPROEX SODIUM ER 250 MG PO TB24
250.0000 mg | ORAL_TABLET | Freq: Every day | ORAL | Status: DC
  Administered 2020-11-15: 250 mg via ORAL
  Filled 2020-11-15: qty 1

## 2020-11-15 MED ORDER — DEXAMETHASONE SODIUM PHOSPHATE 10 MG/ML IJ SOLN
10.0000 mg | Freq: Once | INTRAMUSCULAR | Status: AC
  Administered 2020-11-15: 10 mg via INTRAVENOUS
  Filled 2020-11-15: qty 1

## 2020-11-15 MED ORDER — DIPHENHYDRAMINE HCL 50 MG/ML IJ SOLN
25.0000 mg | Freq: Once | INTRAMUSCULAR | Status: AC
  Administered 2020-11-15: 25 mg via INTRAVENOUS
  Filled 2020-11-15: qty 1

## 2020-11-15 MED ORDER — SODIUM CHLORIDE 0.9 % IV SOLN
8.0000 mg | Freq: Once | INTRAVENOUS | Status: AC
  Administered 2020-11-15: 8 mg via INTRAVENOUS
  Filled 2020-11-15: qty 4

## 2020-11-15 MED ORDER — KETOROLAC TROMETHAMINE 60 MG/2ML IM SOLN
60.0000 mg | Freq: Once | INTRAMUSCULAR | Status: AC
  Administered 2020-11-15: 60 mg via INTRAMUSCULAR
  Filled 2020-11-15: qty 2

## 2020-11-15 MED FILL — DIVALPROEX SOD ER 250 MG TA: 250 | 7 days supply | Qty: 7 | Fill #0

## 2020-11-15 MED FILL — ONDANSETRON HCL 8 MG TABLET: 8 | 30 days supply | Qty: 30 | Fill #0

## 2020-11-15 MED FILL — RIZATRIPTAN BENZOATE 10 MG: 10 | 30 days supply | Qty: 18 | Fill #0

## 2020-11-15 NOTE — Procedures (Signed)
Patient Name: [name]  MRN: [id number]  Epilepsy Attending: Lora Havens  Referring Physician/Provider: Dr Shelly Coss Date: 11/14/2020 Duration: 33.46 mins  Patient history: 60yo F with transient vision changes with "triple vision", disorientation while driving, and amnesia. EEG to evaluate for seizure  Level of alertness: Awake, drowsy  AEDs during EEG study: None  Technical aspects: This EEG study was done with scalp electrodes positioned according to the 10-20 International system of electrode placement. Electrical activity was acquired at a sampling rate of 500Hz  and reviewed with a high frequency filter of 70Hz  and a low frequency filter of 1Hz . EEG data were recorded continuously and digitally stored.   Description: The posterior dominant rhythm consists of 9-10 Hz activity of moderate voltage (25-35 uV) seen predominantly in posterior head regions, symmetric and reactive to eye opening and eye closing. Drowsiness was characterized by attenuation of the posterior background rhythm.  Hyperventilation and photic stimulation were not performed.     IMPRESSION: This study is within normal limits. No seizures or epileptiform discharges were seen throughout the recording.  Travelle Mcclimans Barbra Sarks

## 2020-11-15 NOTE — Discharge Summary (Signed)
Physician Discharge Summary  [name] [id number] DOB: 07/06/1961 DOA: 11/13/2020  PCP: Maurice Small, MD  Admit date: 11/13/2020 Discharge date: 11/15/2020  Admitted From: Home Disposition:  Home  Discharge Condition:Stable CODE STATUS:FULL Diet recommendation: Regular  Brief/Interim Summary:  HPI: [name] is a 60 y.o. female with medical history significant of anxiety/depression, hypertension, hyperlipidemia, history of migraine, Raynaud's syndrome, history of supraventricular tachycardia, IBS and hot flashes; who presented to the emergency department secondary to transient left-sided weakness, dizziness and visual defects. By the time of my evaluation her symptoms were completely resolved and she felt back to normal. Patient reported symptoms were present for couple hours, she last time seen well was after waking up this morning. Patient expressed having episode after showering were her left leg gave away, but she didn't thought too much about it at the time (even felt slightly weak after event for while); she also reported while driving to work experienced diplopia and triple vision; also experienced a period of time according to her 20 minutes or so, of transient amnesia. Patient denies fever, chills, chest pain, nausea, vomiting, dysuria, hematuria, melena, hematochezia, or any other complaints.  Of note, patient is actively using hormonal therapy. She reports being the first time that she experienced any of the symptoms.  ED Course: CT scan of the head without acute abnormality; negative MRI. Normal blood work. As mentioned above at the time of my examination patient symptoms completely resolved. Neurology service was consulted with recommendations to admit for TIA work-up.   Hospital course:  Patient's hospital course remained stable.  TIA/stroke work-up negative. Her symptoms were thought to be secondary to status migrainosus.  She was given multiple doses of  migraine cocktail during this hospitalization.  Neurology was following.  Currently she is hemodynamically stable for discharge.  She will be discharged on Maxalt, valproic acid.  She is to follow-up with her PCP and primary care physician as an outpatient.  Following problems were addressed during hospitalization:   1-suspected TIA /status migrainosus -Negative CT and MRI - CT angiogram of head and neck did not show LVO -Hb A1c normal.  LDL of 134, patient does not want to be started on a statin and agrees with monitoring as an outpatient.. -Her symptoms are not really associated with TIA or CVA.  This is secondary to status migrainosus. -  She was given multiple doses of migraine cocktail during this hospitalization.  Neurology was following.  Currently she is hemodynamically stable for discharge.  She will be discharged on Maxalt, valproic acid.  2-HTN -stable -will continue metoprolol on dC  3-hx of supraventricular tachycardia -normal sinus rhythm  -continue metoprolol -outpatient follow up by Dr. Cathie Olden   4-depression -continue Trintellix     Discharge Diagnoses:  Active Problems:   TIA (transient ischemic attack)    Discharge Instructions  Discharge Instructions    Ambulatory referral to Neurology   Complete by: As directed    An appointment is requested in approximately: 2 weeks   Diet general   Complete by: As directed    Discharge instructions   Complete by: As directed    1)Please follow-up with your PCP in a week. 2)Take prescribed medications as instructed 3)Follow up with [facility] neurology as an outpatient in 2 weeks.  Name and number the provider has been attached.  Call for appointment 4) Take at  least 64 oz of water daily and at least 45 minutes of vigorous physical activity daily and manage stress onward.  Increase activity slowly   Complete by: As directed      Allergies as of 11/15/2020      Reactions   Levaquin [levofloxacin In D5w] Other  (See Comments)   Tendonitis in shoulder, severe yeast infections   Sulfa Antibiotics Other (See Comments)   Unknown reaction - childhood allergy      Medication List    TAKE these medications   cholecalciferol 25 MCG (1000 UNIT) tablet Commonly known as: VITAMIN D3 Take 1,000 Units by mouth daily.   diclofenac Sodium 1 % Gel Commonly known as: VOLTAREN Apply 2 g topically as needed.   divalproex 250 MG 24 hr tablet Commonly known as: Depakote ER Take 1 tablet (250 mg total) by mouth at bedtime for 7 days.   ipratropium 0.06 % nasal spray Commonly known as: ATROVENT Place 2 sprays into both nostrils as needed for rhinitis.   metoprolol succinate 25 MG 24 hr tablet Commonly known as: Toprol XL Take 1 tablet (25 mg total) by mouth daily.   Omega 3 340 MG Cpdr Take 2 capsules by mouth daily.   ondansetron 8 MG tablet Commonly known as: Zofran Take 1 tablet (8 mg total) by mouth daily as needed for nausea or vomiting.   PROBIOTIC PO Take 1 capsule by mouth daily.   progesterone 100 MG capsule Commonly known as: PROMETRIUM Take 1 capsule (100 mg total) by mouth at bedtime.   rizatriptan 10 MG tablet Commonly known as: Maxalt Take 1 tablet (10 mg total) by mouth once as needed for migraine. May repeat in 2 hours if needed.Dont take more than 30 mg in 24 hr   Trintellix 10 MG Tabs tablet Generic drug: vortioxetine HBr Take 10 mg by mouth daily.   Vitamin B12 1000 MCG Tbcr Take 1 tablet by mouth daily.   Vivelle-Dot 0.1 MG/24HR patch Generic drug: estradiol Place 1 patch (0.1 mg total) onto the skin 2 (two) times a week. What changed:   how much to take  additional instructions   Zinc 50 MG Tabs Take 1 tablet by mouth every Monday, Wednesday, and Friday.   zolpidem 10 MG tablet Commonly known as: AMBIEN Take 1 tablet (10 mg total) by mouth at bedtime as needed for sleep. What changed: when to take this       Follow-up Information    Maurice Small, MD.  Schedule an appointment as soon as possible for a visit in 1 week(s).   Specialty: Family Medicine Contact information: [address] [address] [phone]        [facility]. Schedule an appointment as soon as possible for a visit in 2 week(s).   Specialty: Neurology Contact information: [place] [phone]             Allergies  Allergen Reactions  . Levaquin [Levofloxacin In D5w] Other (See Comments)    Tendonitis in shoulder, severe yeast infections  . Sulfa Antibiotics Other (See Comments)    Unknown reaction - childhood allergy    Consultations:  Neurology   Procedures/Studies: CT Angio Head W or Wo Contrast  Result Date: 11/13/2020 CLINICAL DATA:  Vision loss, TIA workup. EXAM: CT ANGIOGRAPHY HEAD AND NECK TECHNIQUE: Multidetector CT imaging of the head and neck was performed using the standard protocol during bolus administration of intravenous contrast. Multiplanar CT image reconstructions and MIPs were obtained to evaluate the vascular anatomy. Carotid stenosis measurements (when applicable) are obtained utilizing NASCET  criteria, using the distal internal carotid diameter as the denominator. CONTRAST:  162mL OMNIPAQUE IOHEXOL 350 MG/ML SOLN COMPARISON:  CT head and MRI from the same day. FINDINGS: CTA NECK FINDINGS Aortic arch: Imaged portion shows no evidence of aneurysm or dissection. No significant stenosis of the major arch vessel origins. Right carotid system: No evidence of dissection, stenosis (50% or greater) or occlusion. Left carotid system: No evidence of dissection, stenosis (50% or greater) or occlusion. Vertebral arteries: Codominant. No evidence of dissection, stenosis (50% or greater) or occlusion. Skeleton: Mild degenerative disc disease at C6-C7 and multilevel facet arthropathy. No acute abnormality. Other neck: No mass or suspicious adenopathy.  Scattered small cervical chain lymph nodes. Upper chest: Negative. Review of the MIP images confirms the above findings CTA HEAD FINDINGS Anterior circulation: No significant stenosis, proximal occlusion, aneurysm, or vascular malformation. Posterior circulation: No significant stenosis, proximal occlusion, aneurysm, or vascular malformation. Left fetal type PCA with hypoplastic left P1 PCA, anatomic variant. Venous sinuses: As permitted by contrast timing, patent. Anatomic variants: Left fetal type PCA with hypoplastic left P1 PCA. Review of the MIP images confirms the above findings IMPRESSION: No large vessel occlusion or hemodynamically significant proximal stenosis in the head or neck. Electronically Signed   By: Margaretha Sheffield MD   On: 11/13/2020 15:41   CT Head Wo Contrast  Result Date: 11/13/2020 CLINICAL DATA:  Lightheaded, double vision and mild headache this morning which has since resolved. Currently with a slight headache. EXAM: CT HEAD WITHOUT CONTRAST TECHNIQUE: Contiguous axial images were obtained from the base of the skull through the vertex without intravenous contrast. COMPARISON:  None. FINDINGS: Brain: Choose 1 Vascular: No hyperdense vessel or unexpected calcification. Skull: Normal. Negative for fracture or focal lesion. Sinuses/Orbits: No acute finding. Other: None. IMPRESSION: No acute intracranial pathology. Electronically Signed   By: Dahlia Bailiff MD   On: 11/13/2020 11:30   CT Angio Neck W and/or Wo Contrast  Result Date: 11/13/2020 CLINICAL DATA:  Vision loss, TIA workup. EXAM: CT ANGIOGRAPHY HEAD AND NECK TECHNIQUE: Multidetector CT imaging of the head and neck was performed using the standard protocol during bolus administration of intravenous contrast. Multiplanar CT image reconstructions and MIPs were obtained to evaluate the vascular anatomy. Carotid stenosis measurements (when applicable) are obtained utilizing NASCET criteria, using the distal internal carotid diameter  as the denominator. CONTRAST:  110mL OMNIPAQUE IOHEXOL 350 MG/ML SOLN COMPARISON:  CT head and MRI from the same day. FINDINGS: CTA NECK FINDINGS Aortic arch: Imaged portion shows no evidence of aneurysm or dissection. No significant stenosis of the major arch vessel origins. Right carotid system: No evidence of dissection, stenosis (50% or greater) or occlusion. Left carotid system: No evidence of dissection, stenosis (50% or greater) or occlusion. Vertebral arteries: Codominant. No evidence of dissection, stenosis (50% or greater) or occlusion. Skeleton: Mild degenerative disc disease at C6-C7 and multilevel facet arthropathy. No acute abnormality. Other neck: No mass or suspicious adenopathy. Scattered small cervical chain lymph nodes. Upper chest: Negative. Review of the MIP images confirms the above findings CTA HEAD FINDINGS Anterior circulation: No significant stenosis, proximal occlusion, aneurysm, or vascular malformation. Posterior circulation: No significant stenosis, proximal occlusion, aneurysm, or vascular malformation. Left fetal type PCA with hypoplastic left P1 PCA, anatomic variant. Venous sinuses: As permitted by contrast timing, patent. Anatomic variants: Left fetal type PCA with hypoplastic left P1 PCA. Review of the MIP images confirms the above findings IMPRESSION: No large vessel occlusion or hemodynamically significant proximal stenosis in the  head or neck. Electronically Signed   By: Margaretha Sheffield MD   On: 11/13/2020 15:41   MR BRAIN WO CONTRAST  Result Date: 11/13/2020 CLINICAL DATA:  Vision loss.  Diplopia. EXAM: MRI HEAD WITHOUT CONTRAST TECHNIQUE: Multiplanar, multiecho pulse sequences of the brain and surrounding structures were obtained without intravenous contrast. COMPARISON:  CT head 11/13/2020 FINDINGS: Brain: Ventricle size and cerebral volume within normal limits. Several small hyperintensities in the left frontal lobe. Brainstem and cerebellum normal. Negative for acute  infarct.  Negative for hemorrhage or mass. Vascular: Normal arterial flow voids. Skull and upper cervical spine: Negative Sinuses/Orbits: Paranasal sinuses clear negative orbit Other: None IMPRESSION: No acute abnormality. Mild white matter changes most likely due to chronic ischemia. Electronically Signed   By: Franchot Gallo M.D.   On: 11/13/2020 13:13   EEG adult  Result Date: 11/15/2020 Lora Havens, MD     11/15/2020  9:51 AM Patient Name: [name] MRN: [id number] Epilepsy Attending: Lora Havens Referring Physician/Provider: Dr Shelly Coss Date: 11/14/2020 Duration: 33.46 mins Patient history: 60yo F with transient vision changes with "triple vision", disorientation while driving, and amnesia. EEG to evaluate for seizure Level of alertness: Awake, drowsy AEDs during EEG study: None Technical aspects: This EEG study was done with scalp electrodes positioned according to the 10-20 International system of electrode placement. Electrical activity was acquired at a sampling rate of 500Hz  and reviewed with a high frequency filter of 70Hz  and a low frequency filter of 1Hz . EEG data were recorded continuously and digitally stored. Description: The posterior dominant rhythm consists of 9-10 Hz activity of moderate voltage (25-35 uV) seen predominantly in posterior head regions, symmetric and reactive to eye opening and eye closing. Drowsiness was characterized by attenuation of the posterior background rhythm.  Hyperventilation and photic stimulation were not performed.   IMPRESSION: This study is within normal limits. No seizures or epileptiform discharges were seen throughout the recording. Lora Havens   ECHOCARDIOGRAM COMPLETE  Result Date: 11/14/2020    ECHOCARDIOGRAM REPORT   Patient Name:   [name] [facility] Date of Exam: 11/14/2020 Medical Rec #:  [id number]          Height:       68.0 in Accession #:    [id number]         Weight:       180.0 lb Date of Birth:  06/08/1961          BSA:           1.954 m Patient Age:    48 years           BP:           109/74 mmHg Patient Gender: F                  HR:           64 bpm. Exam Location:  Inpatient Procedure: 2D Echo, Color Doppler and Cardiac Doppler Indications:    TIA  History:        Patient has no prior history of Echocardiogram examinations.                 Risk Factors:Hypertension.  Sonographer:    Raquel Sarna Senior RDCS Referring Phys: [place]  1. Left ventricular ejection fraction, by estimation, is 60 to 65%. The left ventricle has normal function. The left ventricle has no regional wall motion abnormalities. Left ventricular diastolic parameters were normal.  2.  Right ventricular systolic function is normal. The right ventricular size is normal. Tricuspid regurgitation signal is inadequate for assessing PA pressure.  3. The mitral valve is degenerative. Trivial mitral valve regurgitation. Moderate mitral annular calcification.  4. The aortic valve has an indeterminant number of cusps. Aortic valve regurgitation is mild. Mild aortic valve stenosis. Vmax 2.1 m/s, MG 10 mmHg, AVA 1.5 cm^2, DI 0.46  5. The inferior vena cava is normal in size with greater than 50% respiratory variability, suggesting right atrial pressure of 3 mmHg. FINDINGS  Left Ventricle: Left ventricular ejection fraction, by estimation, is 60 to 65%. The left ventricle has normal function. The left ventricle has no regional wall motion abnormalities. The left ventricular internal cavity size was normal in size. There is  no left ventricular hypertrophy. Left ventricular diastolic parameters were normal. Right Ventricle: The right ventricular size is normal. No increase in right ventricular wall thickness. Right ventricular systolic function is normal. Tricuspid regurgitation signal is inadequate for assessing PA pressure. Left Atrium: Left atrial size was normal in size. Right Atrium: Right atrial size was normal in size. Pericardium: There is no evidence  of pericardial effusion. Mitral Valve: The mitral valve is degenerative in appearance. Moderate mitral annular calcification. Trivial mitral valve regurgitation. Tricuspid Valve: The tricuspid valve is normal in structure. Tricuspid valve regurgitation is not demonstrated. Aortic Valve: The aortic valve has an indeterminant number of cusps. Aortic valve regurgitation is mild. Aortic regurgitation PHT measures 647 msec. Mild aortic stenosis is present. Aortic valve mean gradient measures 10.0 mmHg. Aortic valve peak gradient measures 17.1 mmHg. Aortic valve area, by VTI measures 1.46 cm. Pulmonic Valve: The pulmonic valve was not well visualized. Pulmonic valve regurgitation is not visualized. Aorta: The aortic root and ascending aorta are structurally normal, with no evidence of dilitation. Venous: The inferior vena cava is normal in size with greater than 50% respiratory variability, suggesting right atrial pressure of 3 mmHg. IAS/Shunts: The interatrial septum was not well visualized.  LEFT VENTRICLE PLAX 2D LVIDd:         4.30 cm  Diastology LVIDs:         3.20 cm  LV e' medial:    7.72 cm/s LV PW:         0.80 cm  LV E/e' medial:  9.8 LV IVS:        0.80 cm  LV e' lateral:   8.38 cm/s LVOT diam:     2.00 cm  LV E/e' lateral: 9.0 LV SV:         70 LV SV Index:   36 LVOT Area:     3.14 cm  RIGHT VENTRICLE RV S prime:     11.10 cm/s TAPSE (M-mode): 2.0 cm LEFT ATRIUM             Index       RIGHT ATRIUM           Index LA diam:        3.80 cm 1.94 cm/m  RA Area:     11.90 cm LA Vol (A2C):   37.4 ml 19.14 ml/m RA Volume:   24.20 ml  12.38 ml/m LA Vol (A4C):   36.7 ml 18.78 ml/m LA Biplane Vol: 39.5 ml 20.21 ml/m  AORTIC VALVE AV Area (Vmax):    1.40 cm AV Area (Vmean):   1.32 cm AV Area (VTI):     1.46 cm AV Vmax:           207.00 cm/s  AV Vmean:          151.000 cm/s AV VTI:            0.479 m AV Peak Grad:      17.1 mmHg AV Mean Grad:      10.0 mmHg LVOT Vmax:         92.30 cm/s LVOT Vmean:        63.500  cm/s LVOT VTI:          0.223 m LVOT/AV VTI ratio: 0.47 AI PHT:            647 msec  AORTA Ao Root diam: 3.30 cm Ao Asc diam:  3.20 cm MITRAL VALVE MV Area (PHT): 3.21 cm    SHUNTS MV Decel Time: 236 msec    Systemic VTI:  0.22 m MV E velocity: 75.80 cm/s  Systemic Diam: 2.00 cm MV A velocity: 72.80 cm/s MV E/A ratio:  1.04 Oswaldo Milian MD Electronically signed by Oswaldo Milian MD Signature Date/Time: 11/14/2020/10:35:19 AM    Final        Subjective: Patient seen and examined the bedside this morning.  Medically stable for discharge today  Discharge Exam: Vitals:   11/15/20 0735 11/15/20 1159  BP: 97/61 133/74  Pulse: 75 78  Resp: 18 18  Temp: (!) 97.1 F (36.2 C) (!) 97.4 F (36.3 C)  SpO2: 94% 98%   Vitals:   11/15/20 0002 11/15/20 0421 11/15/20 0735 11/15/20 1159  BP: 105/62 (!) 87/56 97/61 133/74  Pulse: 69 64 75 78  Resp: 18 18 18 18   Temp: 97.8 F (36.6 C) 97.7 F (36.5 C) (!) 97.1 F (36.2 C) (!) 97.4 F (36.3 C)  TempSrc: Oral Oral Oral Oral  SpO2: 94% 96% 94% 98%  Weight:      Height:        General: Pt is alert, awake, not in acute distress Cardiovascular: RRR, S1/S2 +, no rubs, no gallops Respiratory: CTA bilaterally, no wheezing, no rhonchi Abdominal: Soft, NT, ND, bowel sounds + Extremities: no edema, no cyanosis    The results of significant diagnostics from this hospitalization (including imaging, microbiology, ancillary and laboratory) are listed below for reference.     Microbiology: Recent Results (from the past 240 hour(s))  SARS Coronavirus 2 by RT PCR (hospital order, performed in [facility] hospital lab) Nasopharyngeal Nasopharyngeal Swab     Status: None   Collection Time: 11/13/20  2:43 PM   Specimen: Nasopharyngeal Swab  Result Value Ref Range Status   SARS Coronavirus 2 NEGATIVE NEGATIVE Final    Comment: (NOTE) SARS-CoV-2 target nucleic acids are NOT DETECTED.  The SARS-CoV-2 RNA is generally detectable in upper and  lower respiratory specimens during the acute phase of infection. The lowest concentration of SARS-CoV-2 viral copies this assay can detect is 250 copies / mL. A negative result does not preclude SARS-CoV-2 infection and should not be used as the sole basis for treatment or other patient management decisions.  A negative result may occur with improper specimen collection / handling, submission of specimen other than nasopharyngeal swab, presence of viral mutation(s) within the areas targeted by this assay, and inadequate number of viral copies (<250 copies / mL). A negative result must be combined with clinical observations, patient history, and epidemiological information.  Fact Sheet for Patients:   StrictlyIdeas.no  Fact Sheet for Healthcare Providers: BankingDealers.co.za  This test is not yet approved or  cleared by the [place] FDA and has been authorized for detection  and/or diagnosis of SARS-CoV-2 by FDA under an Emergency Use Authorization (EUA).  This EUA will remain in effect (meaning this test can be used) for the duration of the COVID-19 declaration under Section 564(b)(1) of the Act, 21 U.S.C. section 360bbb-3(b)(1), unless the authorization is terminated or revoked sooner.  Performed at [facility], [place] [address] [place], [place] [address]      Labs: BNP (last 3 results) No results for input(s): BNP in the last 8760 hours. Basic Metabolic Panel: Recent Labs  Lab 11/13/20 0811  NA 137  K 4.1  CL 102  CO2 27  GLUCOSE 105*  BUN 15  CREATININE 0.68  CALCIUM 8.9   Liver Function Tests: No results for input(s): AST, ALT, ALKPHOS, BILITOT, PROT, ALBUMIN in the last 168 hours. No results for input(s): LIPASE, AMYLASE in the last 168 hours. No results for input(s): AMMONIA in the last 168 hours. CBC: Recent Labs  Lab 11/13/20 0811  WBC 5.9  HGB 13.1  HCT 40.3  MCV 96.0  PLT 182    Cardiac Enzymes: No results for input(s): CKTOTAL, CKMB, CKMBINDEX, TROPONINI in the last 168 hours. BNP: Invalid input(s): POCBNP CBG: Recent Labs  Lab 11/13/20 1021  GLUCAP 75   D-Dimer No results for input(s): DDIMER in the last 72 hours. Hgb A1c Recent Labs    11/14/20 0408  HGBA1C 5.2   Lipid Profile Recent Labs    11/14/20 0408  CHOL 215*  HDL 58  LDLCALC 134*  TRIG 116  CHOLHDL 3.7   Thyroid function studies Recent Labs    11/13/20 1443  TSH 2.602   Anemia work up Recent Labs    11/13/20 1443  VITAMINB12 373   Urinalysis    Component Value Date/Time   COLORURINE YELLOW 11/13/2020 [place] 11/13/2020 1041   LABSPEC 1.009 11/13/2020 1041   PHURINE 6.0 11/13/2020 1041   [place] 11/13/2020 1041   [place] 11/13/2020 [place] 11/13/2020 1041   [place] 11/13/2020 1041   [place] 11/13/2020 1041   UROBILINOGEN 0.2 08/22/2014 1131   NITRITE NEGATIVE 11/13/2020 1041   [place] 11/13/2020 1041   Sepsis Labs Invalid input(s): PROCALCITONIN,  WBC,  LACTICIDVEN Microbiology Recent Results (from the past 240 hour(s))  SARS Coronavirus 2 by RT PCR (hospital order, performed in [place] hospital lab) Nasopharyngeal Nasopharyngeal Swab     Status: None   Collection Time: 11/13/20  2:43 PM   Specimen: Nasopharyngeal Swab  Result Value Ref Range Status   SARS Coronavirus 2 NEGATIVE NEGATIVE Final    Comment: (NOTE) SARS-CoV-2 target nucleic acids are NOT DETECTED.  The SARS-CoV-2 RNA is generally detectable in upper and lower respiratory specimens during the acute phase of infection. The lowest concentration of SARS-CoV-2 viral copies this assay can detect is 250 copies / mL. A negative result does not preclude SARS-CoV-2 infection and should not be used as the sole basis for treatment or other patient management decisions.  A negative result may occur  with improper specimen collection / handling, submission of specimen other than nasopharyngeal swab, presence of viral mutation(s) within the areas targeted by this assay, and inadequate number of viral copies (<250 copies / mL). A negative result must be combined with clinical observations, patient history, and epidemiological information.  Fact Sheet for Patients:   StrictlyIdeas.no  Fact Sheet for Healthcare Providers: BankingDealers.co.za  This test is not yet approved or  cleared by the [place] FDA  and has been authorized for detection and/or diagnosis of SARS-CoV-2 by FDA under an Emergency Use Authorization (EUA).  This EUA will remain in effect (meaning this test can be used) for the duration of the COVID-19 declaration under Section 564(b)(1) of the Act, 21 U.S.C. section 360bbb-3(b)(1), unless the authorization is terminated or revoked sooner.  Performed at [facility], [place] [address] [place], [place] [address]     Please note: You were cared for by a hospitalist during your hospital stay. Once you are discharged, your primary care physician will handle any further medical issues. Please note that NO REFILLS for any discharge medications will be authorized once you are discharged, as it is imperative that you return to your primary care physician (or establish a relationship with a primary care physician if you do not have one) for your post hospital discharge needs so that they can reassess your need for medications and monitor your lab values.    Time coordinating discharge: 40 minutes  SIGNED:   Shelly Coss, MD  [facility] 11/15/2020, 2:24 PM Pager [phone]  If 7PM-7AM, please contact night-coverage [email or web address] Password [email or web address]

## 2020-11-15 NOTE — Progress Notes (Addendum)
Neurology Progress Note  S: No overnight events. This morning developed migraine this am and cocktail ordered.   O: Current vital signs: BP 133/74 (BP Location: Left Arm)   Pulse 78   Temp (!) 97.4 F (36.3 C) (Oral)   Resp 18   Ht 5\' 8"  (1.727 m)   Wt 81.6 kg   LMP 11/14/2012   SpO2 98%   BMI 27.37 kg/m  Vital signs in last 24 hours: Temp:  [97.1 F (36.2 C)-98 F (36.7 C)] 97.4 F (36.3 C) (02/03 1159) Pulse Rate:  [60-78] 78 (02/03 1159) Resp:  [18] 18 (02/03 1159) BP: (87-133)/(56-74) 133/74 (02/03 1159) SpO2:  [94 %-98 %] 98 % (02/03 1159) GENERAL: Awake, alert in NAD HEENT: Normocephalic and atraumatic, moist mm, no LN++, no thyromegaly LUNGS: symmetric excursions bilaterally with no audible wheezes. CV: RR, equal pulses bilaterally. ABDOMEN: Soft, nontender, nondistended with normoactive BS Ext: warm, well perfused, intact peripheral pulses  NEURO:  Mental Status: AA&Ox3  Language: speech is .  Intact naming, repetition, and comprehension. PERR. EOMI, visual fields full, no facial asymmetry, facial sensation intact, hearing intact. No evidence of tongue atrophy or fibrillations, tongue/uvula/soft palate midline elevates symmetrically  Normal sternocleidomastoid and trapezius muscle strength. Motor:  strength in all extremities. Tone: Tone and bulk is normal Sensation: Intact to light touch bilaterally Coordination: FTN intact bilaterally, no ataxia in BLE. Gait- deferred  Medications  Current Facility-Administered Medications:  .  acetaminophen (TYLENOL) tablet 650 mg, 650 mg, Oral, Q4H PRN, 650 mg at 11/14/20 0519 **OR** acetaminophen (TYLENOL) 160 MG/5ML solution 650 mg, 650 mg, Per Tube, Q4H PRN **OR** acetaminophen (TYLENOL) suppository 650 mg, 650 mg, Rectal, Q4H PRN, Barton Dubois, MD .  aspirin suppository 300 mg, 300 mg, Rectal, Daily **OR** aspirin tablet 325 mg, 325 mg, Oral, Daily, Barton Dubois, MD, 325 mg at 11/15/20 1014 .   butalbital-acetaminophen-caffeine (FIORICET) 50-325-40 MG per tablet 1 tablet, 1 tablet, Oral, Q4H PRN, Shelly Coss, MD, 1 tablet at 11/14/20 0933 .  cholecalciferol (VITAMIN D3) tablet 1,000 Units, 1,000 Units, Oral, Daily, Barton Dubois, MD, 1,000 Units at 11/15/20 1014 .  enoxaparin (LOVENOX) injection 40 mg, 40 mg, Subcutaneous, Q24H, Barton Dubois, MD, 40 mg at 11/14/20 2025 .  metoprolol succinate (TOPROL-XL) 24 hr tablet 25 mg, 25 mg, Oral, Daily, Barton Dubois, MD, 25 mg at 11/15/20 1014 .  ondansetron (ZOFRAN) injection 4 mg, 4 mg, Intravenous, Q6H PRN, Barton Dubois, MD, 4 mg at 11/14/20 1207 .  senna-docusate (Senokot-S) tablet 1 tablet, 1 tablet, Oral, QHS PRN, Barton Dubois, MD .  vitamin B-12 (CYANOCOBALAMIN) tablet 1,000 mcg, 1,000 mcg, Oral, Daily, Barton Dubois, MD, 1,000 mcg at 11/15/20 1014 .  vortioxetine HBr (TRINTELLIX) tablet 10 mg, 10 mg, Oral, Daily, Barton Dubois, MD, 10 mg at 11/15/20 1015 .  zinc sulfate capsule 220 mg, 220 mg, Oral, Q M,W,F, Barton Dubois, MD, 220 mg at 11/14/20 2022 .  zolpidem (AMBIEN) tablet 5 mg, 5 mg, Oral, QHS PRN, Barton Dubois, MD, 5 mg at 11/14/20 2155 Labs     Component Value Date/Time   WBC 5.9 11/13/2020 0811   RBC 4.20 11/13/2020 0811   HGB 13.1 11/13/2020 0811   HCT 40.3 11/13/2020 0811   PLT 182 11/13/2020 0811   MCV 96.0 11/13/2020 0811   MCH 31.2 11/13/2020 0811   MCHC 32.5 11/13/2020 0811   RDW 13.7 11/13/2020 0811   LYMPHSABS 1.7 08/22/2014 1130   MONOABS 0.5 08/22/2014 1130   EOSABS 0.2 08/22/2014 1130  BASOSABS 0.0 08/22/2014 1130       Component Value Date/Time   NA 137 11/13/2020 0811   NA 138 12/09/2019 1052   K 4.1 11/13/2020 0811   CL 102 11/13/2020 0811   CO2 27 11/13/2020 0811   GLUCOSE 105 (H) 11/13/2020 0811   BUN 15 11/13/2020 0811   BUN 15 12/09/2019 1052   CREATININE 0.68 11/13/2020 0811   CREATININE 0.82 08/22/2014 1130   CALCIUM 8.9 11/13/2020 0811   PROT 6.4 08/22/2014 1130    ALBUMIN 4.1 08/22/2014 1130   AST 20 08/22/2014 1130   ALT 23 08/22/2014 1130   ALKPHOS 57 08/22/2014 1130   BILITOT 0.3 08/22/2014 1130   GFRNONAA >60 11/13/2020 0811   GFRAA 79 12/09/2019 1052       Component Value Date/Time   CHOL 215 (H) 11/14/2020 0408   TRIG 116 11/14/2020 0408   HDL 58 11/14/2020 0408   CHOLHDL 3.7 11/14/2020 0408   VLDL 23 11/14/2020 0408   LDLCALC 134 (H) 11/14/2020 0408   EEG was normal.  Assessment:  [name] is a 60 y.o. woman with history of migraine admitted for status migrainosus with good response to migraine cocktail.   Recommendations: - Migraine cocktail:              Prochlorperazine 10mg  IV - Ordered.             Ketorolac 60 mg IM - Ordered.             Diphenhydramine 25mg  IV - Ordered.             Dexamethasone 10mg  - Ordered.  - Recommend for abortive therapy: - Maxalt 10 mg as a single dose. If the migraine comes back or there is no relief, another dose may be taken 2 hours after the last dose. Do not take more than 30 mg in any 24-hour period. - Ondansetron 8mg  q4-6 hours PRN.  PPx: - Valproic acid 250mg  XR at bedtime for a week and until she follows up with her pcp. - At least 64 oz of water daily and at least 45 minutes of vigorous physical activity daily and manage stress onward.   Electronically signed by:  Lynnae Sandhoff, MD Page: [phone] 11/15/2020, 1:38 PM

## 2020-11-15 NOTE — Progress Notes (Signed)
Discharge teaching complete. Meds, diet,activity, follow up appointments reviewed and all questions answered. Copy of instructions given to patient and prescription sent to pharmacy.  

## 2020-11-19 NOTE — Progress Notes (Signed)
[id number]    Provider:  Dr Jaynee Eagles Requesting Provider: Shelly Coss, MD, Shelly Coss MD (inpatient attending). Primary Care Provider:  Maurice Small, MD  CC:  Episode of altered mental status  HPI:  [name] is a 60 y.o. female here as requested by  Shelly Coss MD for migraines. PMHx von Willebrand's disease, ray nodes, peripheral neuropathy, migraine, insomnia, IBS, hyperlipidemia, depression, back pain, arthritis, anxiety.  I reviewed Shelly Coss MDs discharge summary from November 15, 2020: Patient presented to the emergency room secondary to transient left-sided weakness, dizziness and visual defects, by the time physician saw her her symptoms were completely resolved and she felt back to normal, symptoms were present for several hours, she was last seen well when waking up in the morning, she expressed having episodes after showering when her leg gave way, while driving to work she experienced diplopia and triple vision, Rx to experienced a period of time according to her 20 minutes or so of transient amnesia, CT scan of the head was without acute abnormality, negative MRI, normal blood work, TIA/stroke work-up was negative, her symptoms were thought to be secondary to status migrainosus, she was given multiple doses of migraine cocktail during hospitalization, she was discharged on Maxalt and valproic acid.  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 But this has been different. She lost 3 hours, she was at work, she was told she was appropriate ut doesn't remember 3 hours. Not doing well on Depakote. She is taking the maxalt 4-5 times worried the headache will come back. Migraine cocktail inpatient helped, she slept the first night int he hospital, she had severe light sensitivity, she had benadryl, compazine, decadron she felt better prior to going home but the headache has returned but not as severe since she came home. maxalt is helping.   Reviewed notes, labs and imaging from outside physicians, which showed:  From a thorough review of records, medications tried that can be used in migraine management includes Tylenol, aspirin, Fioricet, Flexeril, Decadron injections, Benadryl injections, Depakote, ibuprofen, ketorolac injections, magnesium, Depo-Medrol injection, Reglan injections, metoprolol, Zofran tablets and injections, Compazine injections, rizatriptan, Effexor.   B12 373, TSH 2.6, hemoglobin A1c 5.2, LDL 134, CBC normal, BMP unremarkable, HIV negative.   MRI brain 11/13/2020: FINDINGS: Brain: Ventricle size and cerebral volume within normal limits. Several small hyperintensities in the left frontal lobe. Brainstem and cerebellum normal.  Negative for acute infarct.  Negative for hemorrhage or mass.  Vascular: Normal arterial flow voids.  Skull and upper cervical spine: Negative  Sinuses/Orbits:  Paranasal sinuses clear negative orbit  Other: None  IMPRESSION: No acute abnormality. Mild white matter changes most likely due to chronic ischemia.  CTA H&N: IMPRESSION: 11/13/2020 No large vessel occlusion  or hemodynamically significant proximal stenosis in the head or neck.  IMPRESSION: This study is within normal limits. No seizures or epileptiform discharges were seen throughout the recording.   Review of Systems: Patient complains of symptoms per HPI as well as the following symptoms headache. Pertinent negatives and positives per HPI. All others negative.   Social History   Socioeconomic History  . Marital status: Married    Spouse name: Not on file  . Number of children: Not on file  . Years of education: Not on file  . Highest education level: Not on file  Occupational History  . Not on file  Tobacco Use  . Smoking status: Former Smoker    Years: 10.00    Types: Cigarettes  . Smokeless tobacco: Never Used  Vaping Use  . Vaping Use: Never used  Substance and Sexual Activity  . Alcohol use: Yes    Alcohol/week: 1.0 - 2.0 standard drink    Types: 1 - 2 Standard drinks or equivalent per week    Comment: wine or vodka; was previously 1-2 per night, but liver enzymes up so she has cut back  . Drug use: No  . Sexual activity: Yes    Partners: Male    Comment: husband vasectomy-1st intercourse 27 yo-5 partners  Other Topics Concern  . Not on file  Social History Narrative   Lives at home with husband and 2 children who are freshman in college   Right handed   Caffeine: maybe 1 cup/day   Social Determinants of Health   Financial Resource Strain: Not on file  Food Insecurity: Not on file  Transportation Needs: Not on file  Physical Activity: Not on file  Stress: Not on file  Social Connections: Not on file  Intimate Partner Violence: Not on file    Family History  Problem Relation Age of Onset  . Hypertension Mother   . Arthritis Mother   . Hypertension Father   . Heart disease Father   . Lupus Sister   . Other Sister        Paulina Fusi    Past Medical History:  Diagnosis Date  . Anxiety   . Arthritis   . Back pain   . Chronic rhinitis   .  Complication of anesthesia   . Depression   . Family history of colonic polyps   . Hepatic hemangioma   . Hyperlipidemia   . IBS (irritable bowel syndrome)   . Insomnia   . Migraine   . Osteoarthritis, foot, localized    left  . Perimenopause   . Peripheral neuropathy   . PONV (postoperative nausea and vomiting)   . Raynauds syndrome   . Seasonal allergies   . Von Willebrand's disease ([place])    bruises easily    Patient Active Problem List   Diagnosis Date Noted  . Migraine with aura and with status migrainosus, not intractable 11/20/2020  . TIA (transient ischemic attack) 11/13/2020  . Sinus tachycardia 12/09/2019  . Hypertension 12/09/2019    Past Surgical History:  Procedure Laterality Date  . BONE EXOSTOSIS EXCISION Left 02/24/2019   Procedure: Dorsal Exostectomy;  Surgeon: Wylene Simmer, MD;  Location: [facility];  Service: Orthopedics;  Laterality: Left;  . CESAREAN SECTION    . laparoscopy removal of right ovarian  cyst    . left knee arthroscopy    . NOVASURE ABLATION  12/2005  . pylonidal cyst removal    . SEPTOPLASTY      Current Outpatient Medications  Medication Sig Dispense Refill  . cholecalciferol (VITAMIN D3) 25 MCG (1000 UNIT) tablet Take 1,000 Units by mouth daily.    . Cyanocobalamin (VITAMIN B12) 1000 MCG TBCR Take 1 tablet by mouth daily.    . diclofenac Sodium (VOLTAREN) 1 % GEL Apply 2 g topically as needed.    . divalproex (DEPAKOTE ER) 250 MG 24 hr tablet Take 1 tablet (250 mg total) by mouth at bedtime for 7 days. 7 tablet 0  . GLUTATHIONE PO Take by mouth at bedtime.    Marland Kitchen ipratropium (ATROVENT) 0.06 % nasal spray Place 2 sprays into both nostrils as needed for rhinitis.    . methylPREDNISolone (MEDROL DOSEPAK) 4 MG TBPK tablet Take pills daily with food for 6 days 21 tablet 1  . metoprolol succinate (TOPROL XL) 25 MG 24 hr tablet Take 1 tablet (25 mg total) by mouth daily. 90 tablet 3  . Omega 3 340 MG CPDR Take 2 capsules by  mouth daily.    . ondansetron (ZOFRAN) 8 MG tablet Take 1 tablet (8 mg total) by mouth daily as needed for nausea or vomiting. 30 tablet 0  . Probiotic Product (PROBIOTIC PO) Take 1 capsule by mouth daily.    . progesterone (PROMETRIUM) 100 MG capsule Take 1 capsule (100 mg total) by mouth at bedtime. 30 capsule 11  . Rimegepant Sulfate (NURTEC) 75 MG TBDP Take 75 mg by mouth daily as needed. For migraines. Take as close to onset of migraine as possible. One daily maximum. 14 tablet 0  . rizatriptan (MAXALT) 10 MG tablet Take 1 tablet (10 mg total) by mouth once as needed for migraine. May repeat in 2 hours if needed.Dont take more than 30 mg in 24 hr 30 tablet 0  . saccharomyces boulardii (FLORASTOR) 250 MG capsule Take by mouth. 2 per day    . TRINTELLIX 10 MG TABS tablet Take 10 mg by mouth daily.    Marland Kitchen UNABLE TO FIND Med Name: CPVII    . VIVELLE-DOT 0.1 MG/24HR patch Place 1 patch (0.1 mg total) onto the skin 2 (two) times a week. (Patient taking differently: Place 1 patch onto the skin 2 (two) times a week. Sundays and Wednesdays) 8 patch 12  . Zinc 50 MG TABS Take 1 tablet by mouth every Monday, Wednesday, and Friday.    . zolpidem (AMBIEN) 10 MG tablet Take 1 tablet (10 mg total) by mouth at bedtime as needed for sleep. (Patient taking differently: Take 10 mg by mouth at bedtime.) 90 tablet 4   No current facility-administered medications for this visit.    Allergies as of 11/20/2020 - Review Complete 11/20/2020  Allergen Reaction Noted  . Levaquin [levofloxacin in d5w] Other (See Comments) 07/01/2018  . Sulfa antibiotics Other (See Comments)     Vitals: BP 114/81 (BP Location: Left Arm, Patient Position: Sitting)   Pulse 69   Ht 5\' 8"  (1.727 m)   Wt 183 lb (83 kg)   LMP 11/14/2012   BMI 27.83 kg/m  Last Weight:  Wt Readings from Last 1 Encounters:  11/20/20 183 lb (83 kg)   Last Height:   Ht Readings from Last 1 Encounters:  11/20/20 5\' 8"  (1.727 m)     Physical  exam: Exam: Gen: NAD, conversant, well nourised,  well groomed  CV: RRR, no MRG. No Carotid Bruits. No peripheral edema, warm, nontender Eyes: Conjunctivae clear without exudates or hemorrhage  Neuro: Detailed Neurologic Exam  Speech:    Speech is normal; fluent and spontaneous with normal comprehension.  Cognition:    The patient is oriented to person, place, and time;     recent and remote memory intact;     language fluent;     normal attention, concentration,     fund of knowledge Cranial Nerves:    The pupils are equal, round, and reactive to light. Pupils too small to visualize fundi. Visual fields are full to finger confrontation. Extraocular movements are intact. Trigeminal sensation is intact and the muscles of mastication are normal. The face is symmetric. The palate elevates in the midline. Hearing intact. Voice is normal. Shoulder shrug is normal. The tongue has normal motion without fasciculations.   Coordination:    No dysmetria or ataxia  Gait:   Normal native gait  Motor Observation:    No asymmetry, no atrophy, and no involuntary movements noted. Tone:    Normal muscle tone.    Posture:    Posture is normal. normal erect    Strength:    Strength is V/V in the upper and lower limbs.      Sensation: intact to LT     Reflex Exam:  DTR's:    Deep tendon reflexes in the upper and lower extremities are normal bilaterally.   Toes:    The toes are downgoing bilaterally.   Clonus:    Clonus is absent.    Assessment/Plan:  Patient with hx of migraine with aura, does not recall 3 hours of time (cannot recall anything that happened) in the setting of extreme emotional distress and headache. Stroke wkup inpatient unrevealing, was attributed to migraine. I suspect Transient Global Amnesia, less likely due to migraine or seizure.  - She is on HRT and she has migraines with aura and an episode of transient memory loss/amnesia. Do not recommend  HRT due to stroke risk factors.  - TCD: There is increased prevalence of PFO in migraine with aura. Given possible TGA, if pfo found I would recommend closure - Needs to manage ldl <70, HgbA1c < 6.5 - EEG - EEG normal inpatient. Repeat to increase sensitivity however seizure less likely (she was functional during the hours she can;t remembre and at work) - Daily asa 81mg  - can consider but has von willibran disease, will see what the workup shows - Medrol dosepak and nurtec to try and break the headache however may consider ongoing migraine daily prevention if headache does not resolve. Stop Depakote (side effects)    Orders Placed This Encounter  Procedures  . EEG  . VAS [place] TRANSCRANIAL DOPPLER W BUBBLES   Meds ordered this encounter  Medications  . methylPREDNISolone (MEDROL DOSEPAK) 4 MG TBPK tablet    Sig: Take pills daily with food for 6 days    Dispense:  21 tablet    Refill:  1  . Rimegepant Sulfate (NURTEC) 75 MG TBDP    Sig: Take 75 mg by mouth daily as needed. For migraines. Take as close to onset of migraine as possible. One daily maximum.    Dispense:  14 tablet    Refill:  0    [id number] 6/24    Cc: Shelly Coss, MD,  Maurice Small, MD  Sarina Ill, MD  [facility] [address] [place], [place] [address]  Phone [phone] Fax [phone]  I spent over 80 minutes of face-to-face and non-face-to-face time with patient on the  1. Alteration of awareness   2. Migraine with aura and with status migrainosus, not intractable   3. Transient global amnesia   4. TIA (transient ischemic attack)    diagnosis.  This included previsit chart review, lab review, study review, order entry, electronic health record documentation, patient education on the different diagnostic and therapeutic options, counseling and coordination of care, risks and benefits of management, compliance, or risk factor reduction

## 2020-11-20 ENCOUNTER — Ambulatory Visit (INDEPENDENT_AMBULATORY_CARE_PROVIDER_SITE_OTHER): Payer: 59 | Admitting: Neurology

## 2020-11-20 ENCOUNTER — Other Ambulatory Visit: Payer: Self-pay | Admitting: Neurology

## 2020-11-20 ENCOUNTER — Encounter: Payer: Self-pay | Admitting: Neurology

## 2020-11-20 ENCOUNTER — Other Ambulatory Visit (HOSPITAL_COMMUNITY): Payer: 59

## 2020-11-20 VITALS — BP 114/81 | HR 69 | Ht 68.0 in | Wt 183.0 lb

## 2020-11-20 DIAGNOSIS — G459 Transient cerebral ischemic attack, unspecified: Secondary | ICD-10-CM

## 2020-11-20 DIAGNOSIS — G43101 Migraine with aura, not intractable, with status migrainosus: Secondary | ICD-10-CM | POA: Diagnosis not present

## 2020-11-20 DIAGNOSIS — R419 Unspecified symptoms and signs involving cognitive functions and awareness: Secondary | ICD-10-CM | POA: Diagnosis not present

## 2020-11-20 DIAGNOSIS — G454 Transient global amnesia: Secondary | ICD-10-CM | POA: Diagnosis not present

## 2020-11-20 MED ORDER — NURTEC 75 MG PO TBDP: 75.0000 mg | ORAL_TABLET | Freq: Every day | ORAL | 0 refills | Status: DC | PRN

## 2020-11-20 MED ORDER — METHYLPREDNISOLONE 4 MG PO TBPK: ORAL_TABLET | ORAL | 1 refills | Status: DC

## 2020-11-20 MED FILL — methylPREDNISolone 4 MG dos: 4 | 6 days supply | Qty: 21 | Fill #0

## 2020-11-20 NOTE — Patient Instructions (Addendum)
- Discuss HRT with Dr. Justin Mend - increased risk of stroke(see below) - Transcranial doppler (we need to rule out a PFO- patent foramen ovale) - repeat EEG - seizures less likely - Hold off on asa 81mg  but we may consider in the future - Medrol dosepak 6 days and daily nurtec 2 weeks. If headache persists we may discuss migraine prevention ([name]/Emgality/Qulipta is the newest class and has the least side effects). Can take this with Rizatriptan.  There is increased risk for stroke in women with migraine with aura and a contraindication for the combined contraceptive pill for use by women who have migraine with aura. The risk for women with migraine without aura is lower. However other risk factors like smoking are far more likely to increase stroke risk than migraine. There is a recommendation for no smoking and for the use of OCPs without estrogen such as progestogen only pills particularly for women with migraine with aura.Marland Kitchen People who have migraine headaches with auras may be 3 times more likely to have a stroke caused by a blood clot, compared to migraine patients who don't see auras. Women who take hormone-replacement therapy may be 30 percent more likely to suffer a clot-based stroke than women not taking medication containing estrogen. Other risk factors like smoking and high blood pressure may be  much more important.   Patent Foramen Ovale, Adult  A foramen ovale is a hole between the upper chambers (right atrium and left atrium) of the heart. Before you are born, it is normal to have this hole in your heart. The hole allows blood to circulate through the body without having to go through the lungs. After your birth, when you are able to breathe, you do not need the foramen ovale and it usually closes. If the hole does not close, it is called a patent foramen ovale (PFO). PFO is a common condition. Most people do not know they have this hole, and they do not have any health problems caused by  it. What are the causes? The cause of this condition is not known. What are the signs or symptoms? In most cases, there are no symptoms of this condition. Possible rare symptoms include:  Stroke caused by a blood clot.  Migraine headaches.  Platypnea-orthodeoxia syndrome. This is a condition in which a person has shortness of breath and decreased oxygen when seated or standing but feels better when lying down. How is this diagnosed? This condition may be diagnosed based on:  A physical exam and your medical history.  Echocardiogram. This test uses sound waves to produce images of the heart.  Transesophageal echocardiogram (TEE). This type of echocardiogram is performed by placing a probe in the part of the body that moves food from the mouth to the stomach (esophagus).  Electrocardiogram (ECG). This test identifies changes in the electrical activity of the heart.  Cardiac MRI. This is an imaging technique that is used to visualize the heart, if further images are needed after TEE. How is this treated? Usually, no treatment is needed. If your condition is associated with symptoms or blood clots, you may need:  Medicines to prevent blood clots and strokes (anticoagulant or antiplateletmedicines).  A surgical procedure to close the hole (transcatheter closure). Follow these instructions at home:  Take over-the-counter and prescription medicines only as told by your health care provider.  Keep all follow-up visits. This is important. Contact a health care provider if:  You have a fever.  You have frequent or severe  headaches. Get help right away if:  Your skin turns blue.  You have chest pain or difficulty breathing.  You have any symptoms of stroke. "BE FAST" is an easy way to remember the main warning signs of stroke: ? B - Balance. Signs are dizziness, sudden trouble walking, or loss of balance. ? E - Eyes. Signs are trouble seeing or a sudden change in vision. ? F -  Face. Signs are sudden weakness or numbness of the face, or the face or eyelid drooping on one side. ? A - Arms. Signs are weakness or numbness in an arm. This happens suddenly and usually on one side of the body. ? S - Speech. Signs are sudden trouble speaking, slurred speech, or trouble understanding what people say. ? T - Time. Time to call emergency services. Write down what time symptoms started.  You have other signs of a stroke, such as: ? A sudden, severe headache with no known cause. ? Nausea or vomiting. ? Seizure. These symptoms may represent a serious problem that is an emergency. Do not wait to see if the symptoms will go away. Get medical help right away. Call your local emergency services (911 in the [place] Do not drive yourself to the hospital. Summary  A patent foramen ovale is a hole between the upper chambers (right atrium and left atrium) of your heart. The cause of this condition is not known.  You may not know that you have a hole in your heart, and you may not have any health problems from it.  Usually, no treatment is needed for this condition unless you have symptoms or blood clots. This information is not intended to replace advice given to you by your health care provider. Make sure you discuss any questions you have with your health care provider. Document Revised: 08/02/2020 Document Reviewed: 08/02/2020 Elsevier Patient Education  2021 [place].  Transient Global Amnesia Transient global amnesia causes a sudden and temporary (transient) loss of memory (amnesia). You may recall memories from your distant past and people you know well. However, you may not recall things that happened more recently in the past days, months, or even year. A transient global amnesia episode does not last longer than 24 hours. Transient global amnesia does not affect your other brain functions. Your memory usually returns to normal after an episode is over. One episode of transient  global amnesia does not make you more likely to have a stroke, a relapse, or other complications. What are the causes? The cause of this condition is not known. Certain activities have been reported to trigger transient global amnesia. These activities include:  Swimming in very cold or hot water.  Sexual intercourse.  Emotional distress, such as receiving bad news or having a lot of stress at once.  Strenuous exercise or activity. What increases the risk? You are more likely to develop this condition if:  You are 38-29 years old.  You have a history of migraine headaches. What are the signs or symptoms? The main symptoms of this condition include:  Being unable to remember recent events.  Asking repetitive questions about a situation and surroundings and not recalling the answers to these questions. Other symptoms include:  Restlessness and nervousness.  Confusion.  Headaches.  Dizziness.  Nausea. How is this diagnosed? This condition may be diagnosed based on:  Your symptoms.  A physical exam.  A test to check your mental abilities (cognitive evaluation).  Imaging studies to check brain function. These may  include: ? Electroencephalogram (EEG). This test checks the brain's electrical activity. ? CT scan. ? MRI.   How is this treated? There is no treatment for this condition. An episode typically goes away on its own after a few hours. You may receive medicines to treat other conditions, such as a migraine. Follow these instructions at home:  Take over-the-counter and prescription medicines only as told by your health care provider.  Avoid taking medicines that can affect thinking, such as pain or sleeping medicines.  Learn what activities may trigger an episode. Avoid these activities as told by your health care provider.  Find ways to manage stress, such as meditation or yoga.  Keep all follow-up visits as told by your health care provider. This is  important. Contact a health care provider if you:  Have a migraine that does not go away.  Experience transient global amnesia repeatedly. Get help right away if you:  Have a seizure. Summary  Transient global amnesia causes a sudden and temporary (transient) loss of memory (amnesia).  There is no treatment for this condition. An episode typically goes away on its own after a few hours.  You may receive medicines to treat other conditions, such as a migraine.  Transient global amnesia does not affect your other brain functions. Your memory usually returns to normal after an episode is over. This information is not intended to replace advice given to you by your health care provider. Make sure you discuss any questions you have with your health care provider. Document Revised: 10/14/2017 Document Reviewed: 10/14/2017 Elsevier Patient Education  2021 [place] oral dissolving tablet What is this medicine? RIMEGEPANT (ri ME je pant) is used to treat migraine headaches with or without aura. An aura is a strange feeling or visual disturbance that warns you of an attack. It is also used to prevent migraine headaches. This medicine may be used for other purposes; ask your health care provider or pharmacist if you have questions. COMMON BRAND NAME(S): NURTEC ODT What should I tell my health care provider before I take this medicine? They need to know if you have any of these conditions:  kidney disease  liver disease  an unusual or allergic reaction to rimegepant, other medicines, foods, dyes, or preservatives  pregnant or trying to get pregnant  breast-feeding How should I use this medicine? Take the medicine by mouth. Follow the directions on the prescription label. Leave the tablet in the sealed blister pack until you are ready to take it. With dry hands, open the blister and gently remove the tablet. If the tablet breaks or crumbles, throw it away and take a new  tablet out of the blister pack. Place the tablet in the mouth and allow it to dissolve, and then swallow. Do not cut, crush, or chew this medicine. You do not need water to take this medicine. Talk to your pediatrician about the use of this medicine in children. Special care may be needed. Overdosage: If you think you have taken too much of this medicine contact a poison control center or emergency room at once. NOTE: This medicine is only for you. Do not share this medicine with others. What if I miss a dose? This does not apply. This medicine is not for regular use. What may interact with this medicine? This medicine may interact with the following medications:  certain medicines for fungal infections like fluconazole, itraconazole  rifampin This list may not describe all possible interactions. Give your health  care provider a list of all the medicines, herbs, non-prescription drugs, or dietary supplements you use. Also tell them if you smoke, drink alcohol, or use illegal drugs. Some items may interact with your medicine. What should I watch for while using this medicine? Visit your health care professional for regular checks on your progress. Tell your health care professional if your symptoms do not start to get better or if they get worse. What side effects may I notice from receiving this medicine? Side effects that you should report to your doctor or health care professional as soon as possible:  allergic reactions like skin rash, itching or hives; swelling of the face, lips, or tongue Side effects that usually do not require medical attention (report these to your doctor or health care professional if they continue or are bothersome):  nausea This list may not describe all possible side effects. Call your doctor for medical advice about side effects. You may report side effects to FDA at 1-800-FDA-1088. Where should I keep my medicine? Keep out of the reach of children and  pets. Store at room temperature between 20 and 25 degrees C (68 and 77 degrees F). Get rid of any unused medicine after the expiration date. To get rid of medicines that are no longer needed or have expired:  Take the medicine to a medicine take-back program. Check with your pharmacy or law enforcement to find a location.  If you cannot return the medicine, check the label or package insert to see if the medicine should be thrown out in the garbage or flushed down the toilet. If you are not sure, ask your health care provider. If it is safe to put it in the trash, take the medicine out of the container. Mix the medicine with cat litter, dirt, coffee grounds, or other unwanted substance. Seal the mixture in a bag or container. Put it in the trash. NOTE: This sheet is a summary. It may not cover all possible information. If you have questions about this medicine, talk to your doctor, pharmacist, or health care provider.  2021 Elsevier/Gold Standard (2020-03-13 17:56:55) Methylprednisolone tablets What is this medicine? METHYLPREDNISOLONE (meth ill pred NISS oh lone) is a corticosteroid. It is commonly used to treat inflammation of the skin, joints, lungs, and other organs. Common conditions treated include asthma, allergies, and arthritis. It is also used for other conditions, such as blood disorders and diseases of the adrenal glands. This medicine may be used for other purposes; ask your health care provider or pharmacist if you have questions. COMMON BRAND NAME(S): Medrol, Medrol Dosepak What should I tell my health care provider before I take this medicine? They need to know if you have any of these conditions:  Cushing's syndrome  eye disease, vision problems  diabetes  glaucoma  heart disease  high blood pressure  infection (especially a virus infection such as chickenpox, cold sores, or herpes)  liver disease  mental illness  myasthenia gravis  osteoporosis  recently  received or scheduled to receive a vaccine  seizures  stomach or intestine problems  thyroid disease  an unusual or allergic reaction to lactose, methylprednisolone, other medicines, foods, dyes, or preservatives  pregnant or trying to get pregnant  breast-feeding How should I use this medicine? Take this medicine by mouth with a glass of water. Follow the directions on the prescription label. Take this medicine with food. If you are taking this medicine once a day, take it in the morning. Do not take it  more often than directed. Do not suddenly stop taking your medicine because you may develop a severe reaction. Your doctor will tell you how much medicine to take. If your doctor wants you to stop the medicine, the dose may be slowly lowered over time to avoid any side effects. Talk to your pediatrician regarding the use of this medicine in children. Special care may be needed. Overdosage: If you think you have taken too much of this medicine contact a poison control center or emergency room at once. NOTE: This medicine is only for you. Do not share this medicine with others. What if I miss a dose? If you miss a dose, take it as soon as you can. If it is almost time for your next dose, talk to your doctor or health care professional. You may need to miss a dose or take an extra dose. Do not take double or extra doses without advice. What may interact with this medicine? Do not take this medicine with any of the following medications:  alefacept  echinacea  live virus vaccines  metyrapone  mifepristone This medicine may also interact with the following medications:  amphotericin B  aspirin and aspirin-like medicines  certain antibiotics like erythromycin, clarithromycin, troleandomycin  certain medicines for diabetes  certain medicines for fungal infections like ketoconazole  certain medicines for seizures like carbamazepine, phenobarbital, phenytoin  certain medicines  that treat or prevent blood clots like warfarin  cholestyramine  cyclosporine  digoxin  diuretics  female hormones, like estrogens and birth control pills  isoniazid  NSAIDs, medicines for pain inflammation, like ibuprofen or naproxen  other medicines for myasthenia gravis  rifampin  vaccines This list may not describe all possible interactions. Give your health care provider a list of all the medicines, herbs, non-prescription drugs, or dietary supplements you use. Also tell them if you smoke, drink alcohol, or use illegal drugs. Some items may interact with your medicine. What should I watch for while using this medicine? Tell your doctor or healthcare professional if your symptoms do not start to get better or if they get worse. Do not stop taking except on your doctor's advice. You may develop a severe reaction. Your doctor will tell you how much medicine to take. This medicine may increase your risk of getting an infection. Tell your doctor or health care professional if you are around anyone with measles or chickenpox, or if you develop sores or blisters that do not heal properly. This medicine may increase blood sugar levels. Ask your healthcare provider if changes in diet or medicines are needed if you have diabetes. Tell your doctor or health care professional right away if you have any change in your eyesight. Using this medicine for a long time may increase your risk of low bone mass. Talk to your doctor about bone health. What side effects may I notice from receiving this medicine? Side effects that you should report to your doctor or health care professional as soon as possible:  allergic reactions like skin rash, itching or hives, swelling of the face, lips, or tongue  bloody or tarry stools  hallucination, loss of contact with reality  muscle cramps  muscle pain  palpitations  signs and symptoms of high blood sugar such as being more thirsty or hungry or  having to urinate more than normal. You may also feel very tired or have blurry vision.  signs and symptoms of infection like fever or chills; cough; sore throat; pain or trouble passing urine Side  effects that usually do not require medical attention (report to your doctor or health care professional if they continue or are bothersome):  changes in emotions or mood  constipation  diarrhea  excessive hair growth on the face or body  headache  nausea, vomiting  trouble sleeping  weight gain This list may not describe all possible side effects. Call your doctor for medical advice about side effects. You may report side effects to FDA at 1-800-FDA-1088. Where should I keep my medicine? Keep out of the reach of children. Store at room temperature between 20 and 25 degrees C (68 and 77 degrees F). Throw away any unused medicine after the expiration date. NOTE: This sheet is a summary. It may not cover all possible information. If you have questions about this medicine, talk to your doctor, pharmacist, or health care provider.  2021 Elsevier/Gold Standard (2018-07-01 09:19:36)

## 2020-11-22 DIAGNOSIS — R55 Syncope and collapse: Secondary | ICD-10-CM | POA: Diagnosis not present

## 2020-11-22 DIAGNOSIS — Z09 Encounter for follow-up examination after completed treatment for conditions other than malignant neoplasm: Secondary | ICD-10-CM | POA: Diagnosis not present

## 2020-11-22 DIAGNOSIS — H538 Other visual disturbances: Secondary | ICD-10-CM | POA: Diagnosis not present

## 2020-11-27 ENCOUNTER — Other Ambulatory Visit: Payer: Self-pay | Admitting: Neurology

## 2020-11-27 MED ORDER — INDOMETHACIN 50 MG PO CAPS: 50.0000 mg | ORAL_CAPSULE | Freq: Three times a day (TID) | ORAL | 0 refills | Status: DC

## 2020-11-27 MED ORDER — ALMOTRIPTAN MALATE 12.5 MG PO TABS: 12.5000 mg | ORAL_TABLET | Freq: Two times a day (BID) | ORAL | 0 refills | Status: DC

## 2020-11-27 MED FILL — METOPROLOL SUCCINATE ER 25: 25 | 90 days supply | Qty: 90 | Fill #3

## 2020-12-04 MED FILL — TRINTELLIX 10 MG TABLET: 10 | 90 days supply | Qty: 90 | Fill #1

## 2020-12-05 ENCOUNTER — Other Ambulatory Visit: Payer: Self-pay

## 2020-12-05 ENCOUNTER — Ambulatory Visit (HOSPITAL_COMMUNITY)
Admission: RE | Admit: 2020-12-05 | Discharge: 2020-12-05 | Disposition: A | Discharge status: Home / Self Care | Payer: 59 | Source: Ambulatory Visit | Attending: Neurology | Admitting: Neurology

## 2020-12-05 ENCOUNTER — Telehealth: Payer: Self-pay | Admitting: Neurology

## 2020-12-05 DIAGNOSIS — R419 Unspecified symptoms and signs involving cognitive functions and awareness: Secondary | ICD-10-CM

## 2020-12-05 DIAGNOSIS — G43101 Migraine with aura, not intractable, with status migrainosus: Secondary | ICD-10-CM | POA: Diagnosis not present

## 2020-12-05 DIAGNOSIS — G459 Transient cerebral ischemic attack, unspecified: Secondary | ICD-10-CM | POA: Diagnosis not present

## 2020-12-05 DIAGNOSIS — G454 Transient global amnesia: Secondary | ICD-10-CM | POA: Diagnosis not present

## 2020-12-05 NOTE — Telephone Encounter (Signed)
Unsure if this is new hospitalization or the one from earlier this month? I called pt and LVM asking for call back.

## 2020-12-05 NOTE — Telephone Encounter (Signed)
Let's see if they have room in the infusion suite for a migraine cocktail. thanks

## 2020-12-05 NOTE — Telephone Encounter (Signed)
Spoke with patient. She stated the medication she was given ended on Monday (medrol dosepack done, Indomethacin done, Axert done) and she has had a horrible headache. She woke up yesterday at 1 with it. She alternated Tylenol and Motrin. Pt also stated she is grinding her teeth and will see the dentist tomorrow. She stated she was incontinent of stool yesterday AM and doesn't know why. Pt reported she is supposed to go back to work tomorrow but doesn't think she can. I let her know I would check with Dr Jaynee Eagles and call her back.

## 2020-12-05 NOTE — Telephone Encounter (Signed)
Pt. states that she's been in the hospital & they diagnosed her with global amnesia event. She states she's been up all night & head is still hurting. She's asking if she can be worked in this week. Please advise.

## 2020-12-06 ENCOUNTER — Telehealth: Payer: Self-pay | Admitting: *Deleted

## 2020-12-06 DIAGNOSIS — G43109 Migraine with aura, not intractable, without status migrainosus: Secondary | ICD-10-CM | POA: Diagnosis not present

## 2020-12-06 NOTE — Telephone Encounter (Signed)
Melvenia Beam, MD  12/05/2020 5:41 PM EST Back to Top     Negative transcranial Doppler Bubble study with no evidence of right to left intracardiac communication (no PFO) thanks

## 2020-12-06 NOTE — Telephone Encounter (Signed)
Patient responded via mychart and is amenable to the plan for a migraine infusion. I have her scheduled for 1 pm and orders have been updated to specify patient will receive Benadryl 25 mg IV x 1 followed by Compazine 10 mg IV x 1 in addition to the Solumedrol, Depacon, and Toradol. Pt insurance/demographics information printed for [facility] and included with order form. Orders are pending Dr Cathren Laine signature.

## 2020-12-06 NOTE — Telephone Encounter (Signed)
Spoke with Dr Jaynee Eagles. Infusion orders obtained for patient. Depacon 1 gram IV x 1, Solu-Medrol 125 mg IV x 1, Toradol 30 mg IV x 1, and if patient does not have a driver we will order Zofran 4 mg IV x 1. If she has a driver, we will order Compazine 10 mg IV x 1 and Benadryl 25 mg IV x 1 instead of Zofran. Patient has sent a mychart message already this AM so I responded to her with the offer to do a migraine infusion. Waiting on her response.

## 2020-12-06 NOTE — Telephone Encounter (Signed)
Spoke with Romilda Joy RN in infusion. They have availability today for a migraine cocktail. Will discuss orders with Dr Jaynee Eagles and call patient to schedule.

## 2020-12-09 MED FILL — RIZATRIPTAN BENZOATE 10 MG: 10 | 30 days supply | Qty: 12 | Fill #0

## 2020-12-10 ENCOUNTER — Ambulatory Visit (INDEPENDENT_AMBULATORY_CARE_PROVIDER_SITE_OTHER): Payer: 59 | Admitting: Neurology

## 2020-12-10 DIAGNOSIS — R419 Unspecified symptoms and signs involving cognitive functions and awareness: Secondary | ICD-10-CM

## 2020-12-10 DIAGNOSIS — R4182 Altered mental status, unspecified: Secondary | ICD-10-CM | POA: Diagnosis not present

## 2020-12-11 HISTORY — PX: TOOTH EXTRACTION: SUR596

## 2020-12-12 ENCOUNTER — Ambulatory Visit (INDEPENDENT_AMBULATORY_CARE_PROVIDER_SITE_OTHER): Payer: 59 | Admitting: Adult Health

## 2020-12-12 ENCOUNTER — Encounter: Payer: Self-pay | Admitting: Adult Health

## 2020-12-12 VITALS — BP 126/77 | HR 67 | Ht 68.0 in | Wt 183.0 lb

## 2020-12-12 DIAGNOSIS — G4452 New daily persistent headache (NDPH): Secondary | ICD-10-CM | POA: Diagnosis not present

## 2020-12-12 DIAGNOSIS — G43101 Migraine with aura, not intractable, with status migrainosus: Secondary | ICD-10-CM | POA: Diagnosis not present

## 2020-12-12 NOTE — Procedures (Signed)
    History:  [name] is a 60 year old patient with a history of migraine headache.  The patient has had significant headaches associated with left-sided weakness and dizziness and visual changes.  The patient experienced about 20 minutes of transient amnesia.  She is being evaluated for this event.  This is a routine EEG.  No skull defects are noted.  Medications include vitamin D, vitamin B12, Depakote, Atrovent, Toprol, Zofran, Prometrium, Nurtec, Maxalt, Trintellix, zinc, and Ambien.  EEG classification: Normal awake  Description of the recording: The background rhythms of this recording consists of a fairly well modulated medium amplitude alpha rhythm of 9 Hz that is reactive to eye opening and closure. As the record progresses, the patient appears to remain in the waking state throughout the recording. Photic stimulation was performed, resulting in a bilateral and symmetric photic driving response. Hyperventilation was also performed, resulting in a minimal buildup of the background rhythm activities without significant slowing seen. At no time during the recording does there appear to be evidence of spike or spike wave discharges or evidence of focal slowing. EKG monitor shows no evidence of cardiac rhythm abnormalities with a heart rate of 66.  Impression: This is a normal EEG recording in the waking state. No evidence of ictal or interictal discharges are seen.

## 2020-12-12 NOTE — Patient Instructions (Signed)
Your Plan:  Rest and stay hydrated. Monitor symptoms for the next 4-6 days Can consider topamax or nortriptyline for prevention  If your symptoms worsen or you develop new symptoms please let us know.    Thank you for coming to see [place] at [facility]. I hope we have been able to provide you high quality care today.  You may receive a patient satisfaction survey over the next few weeks. We would appreciate your feedback and comments so that we may continue to improve ourselves and the health of our patients.  Nortriptyline capsules What is this medicine? NORTRIPTYLINE (nor TRIP ti leen) is used to treat depression. This medicine may be used for other purposes; ask your health care provider or pharmacist if you have questions. COMMON BRAND NAME(S): Aventyl, Pamelor What should I tell my health care provider before I take this medicine? They need to know if you have any of these conditions:  bipolar disorder  Brugada syndrome  difficulty passing urine  glaucoma  heart disease  if you drink alcohol  liver disease  schizophrenia  seizures  suicidal thoughts, plans or attempt; a previous suicide attempt by you or a family member  thyroid disease  an unusual or allergic reaction to nortriptyline, other tricyclic antidepressants, other medicines, foods, dyes, or preservatives  pregnant or trying to get pregnant  breast-feeding How should I use this medicine? Take this medicine by mouth with a glass of water. Follow the directions on the prescription label. Take your doses at regular intervals. Do not take it more often than directed. Do not stop taking this medicine suddenly except upon the advice of your doctor. Stopping this medicine too quickly may cause serious side effects or your condition may worsen. A special MedGuide will be given to you by the pharmacist with each prescription and refill. Be sure to read this information carefully each time. Talk  to your pediatrician regarding the use of this medicine in children. Special care may be needed. Overdosage: If you think you have taken too much of this medicine contact a poison control center or emergency room at once. NOTE: This medicine is only for you. Do not share this medicine with others. What if I miss a dose? If you miss a dose, take it as soon as you can. If it is almost time for your next dose, take only that dose. Do not take double or extra doses. What may interact with this medicine? Do not take this medicine with any of the following medications:  cisapride  dronedarone  linezolid  MAOIs like Carbex, Eldepryl, Marplan, Nardil, and Parnate  methylene blue (injected into a vein)  pimozide  thioridazine This medicine may also interact with the following medications:  alcohol  antihistamines for allergy, cough, and cold  atropine  certain medicines for bladder problems like oxybutynin, tolterodine  certain medicines for depression like amitriptyline, fluoxetine, sertraline  certain medicines for Parkinson's disease like benztropine, trihexyphenidyl  certain medicines for stomach problems like dicyclomine, hyoscyamine  certain medicines for travel sickness like scopolamine  chlorpropamide  cimetidine  ipratropium  other medicines that prolong the QT interval (an abnormal heart rhythm) like dofetilide  other medicines that can cause serotonin syndrome like St. John's Wort, fentanyl, lithium, tramadol, tryptophan, buspirone, and some medicines for headaches like sumatriptan or rizatriptan  quinidine  reserpine  thyroid medicine This list may not describe all possible interactions. Give your health care provider a list of all the medicines, herbs, non-prescription drugs, or dietary  supplements you use. Also tell them if you smoke, drink alcohol, or use illegal drugs. Some items may interact with your medicine. What should I watch for while using this  medicine? Tell your doctor if your symptoms do not get better or if they get worse. Visit your doctor or health care professional for regular checks on your progress. Because it may take several weeks to see the full effects of this medicine, it is important to continue your treatment as prescribed by your doctor. Patients and their families should watch out for new or worsening thoughts of suicide or depression. Also watch out for sudden changes in feelings such as feeling anxious, agitated, panicky, irritable, hostile, aggressive, impulsive, severely restless, overly excited and hyperactive, or not being able to sleep. If this happens, especially at the beginning of treatment or after a change in dose, call your health care professional. Dennis Bast may get drowsy or dizzy. Do not drive, use machinery, or do anything that needs mental alertness until you know how this medicine affects you. Do not stand or sit up quickly, especially if you are an older patient. This reduces the risk of dizzy or fainting spells. Alcohol may interfere with the effect of this medicine. Avoid alcoholic drinks. Do not treat yourself for coughs, colds, or allergies without asking your doctor or health care professional for advice. Some ingredients can increase possible side effects. Your mouth may get dry. Chewing sugarless gum or sucking hard candy, and drinking plenty of water may help. Contact your doctor if the problem does not go away or is severe. This medicine may cause dry eyes and blurred vision. If you wear contact lenses you may feel some discomfort. Lubricating drops may help. See your eye doctor if the problem does not go away or is severe. This medicine can cause constipation. Try to have a bowel movement at least every 2 to 3 days. If you do not have a bowel movement for 3 days, call your doctor or health care professional. This medicine can make you more sensitive to the sun. Keep out of the sun. If you cannot avoid being  in the sun, wear protective clothing and use sunscreen. Do not use sun lamps or tanning beds/booths. What side effects may I notice from receiving this medicine? Side effects that you should report to your doctor or health care professional as soon as possible:  allergic reactions like skin rash, itching or hives, swelling of the face, lips, or tongue  anxious  breathing problems  changes in vision  confusion  elevated mood, decreased need for sleep, racing thoughts, impulsive behavior  eye pain  fast, irregular heartbeat  feeling faint or lightheaded, falls  feeling agitated, angry, or irritable  fever with increased sweating  hallucination, loss of contact with reality  seizures  stiff muscles  suicidal thoughts or other mood changes  tingling, pain, or numbness in the feet or hands  trouble passing urine or change in the amount of urine  trouble sleeping  unusually weak or tired  vomiting  yellowing of the eyes or skin Side effects that usually do not require medical attention (report to your doctor or health care professional if they continue or are bothersome):  change in sex drive or performance  change in appetite or weight  constipation  dizziness  dry mouth  nausea  tired  tremors  upset stomach This list may not describe all possible side effects. Call your doctor for medical advice about side effects. You may  report side effects to FDA at 1-800-FDA-1088. Where should I keep my medicine? Keep out of the reach of children. Store at room temperature between 15 and 30 degrees C (59 and 86 degrees F). Keep container tightly closed. Throw away any unused medicine after the expiration date. NOTE: This sheet is a summary. It may not cover all possible information. If you have questions about this medicine, talk to your doctor, pharmacist, or health care provider.  2021 Elsevier/Gold Standard (2020-05-15 15:25:34) Topiramate tablets What is  this medicine? TOPIRAMATE (toe PYRE a mate) is used to treat seizures in adults or children with epilepsy. It is also used for the prevention of migraine headaches. This medicine may be used for other purposes; ask your health care provider or pharmacist if you have questions. COMMON BRAND NAME(S): Topamax, Topiragen What should I tell my health care provider before I take this medicine? They need to know if you have any of these conditions:  bleeding disorder  kidney disease  lung disease  suicidal thoughts, plans, or attempt  an unusual or allergic reaction to topiramate, other medicines, foods, dyes, or preservatives  pregnant or trying to get pregnant  breast-feeding How should I use this medicine? Take this medicine by mouth with a glass of water. Follow the directions on the prescription label. Do not cut, crush or chew this medicine. Swallow the tablets whole. You can take it with or without food. If it upsets your stomach, take it with food. Take your medicine at regular intervals. Do not take it more often than directed. Do not stop taking except on your doctor's advice. A special MedGuide will be given to you by the pharmacist with each prescription and refill. Be sure to read this information carefully each time. Talk to your pediatrician regarding the use of this medicine in children. While this drug may be prescribed for children as young as 17 years of age for selected conditions, precautions do apply. Overdosage: If you think you have taken too much of this medicine contact a poison control center or emergency room at once. NOTE: This medicine is only for you. Do not share this medicine with others. What if I miss a dose? If you miss a dose, take it as soon as you can. If your next dose is to be taken in less than 6 hours, then do not take the missed dose. Take the next dose at your regular time. Do not take double or extra doses. What may interact with this medicine? This  medicine may interact with the following medications:  acetazolamide  alcohol  antihistamines for allergy, cough, and cold  aspirin and aspirin-like medicines  atropine  birth control pills  certain medicines for anxiety or sleep  certain medicines for bladder problems like oxybutynin, tolterodine  certain medicines for depression like amitriptyline, fluoxetine, sertraline  certain medicines for seizures like carbamazepine, phenobarbital, phenytoin, primidone, valproic acid, zonisamide  certain medicines for stomach problems like dicyclomine, hyoscyamine  certain medicines for travel sickness like scopolamine  certain medicines for Parkinson's disease like benztropine, trihexyphenidyl  certain medicines that treat or prevent blood clots like warfarin, enoxaparin, dalteparin, apixaban, dabigatran, and rivaroxaban  digoxin  general anesthetics like halothane, isoflurane, methoxyflurane, propofol  hydrochlorothiazide  ipratropium  lithium  medicines that relax muscles for surgery  metformin  narcotic medicines for pain  NSAIDs, medicines for pain and inflammation, like ibuprofen or naproxen  phenothiazines like chlorpromazine, mesoridazine, prochlorperazine, thioridazine  pioglitazone This list may not describe all possible interactions.  Give your health care provider a list of all the medicines, herbs, non-prescription drugs, or dietary supplements you use. Also tell them if you smoke, drink alcohol, or use illegal drugs. Some items may interact with your medicine. What should I watch for while using this medicine? Visit your doctor or health care professional for regular checks on your progress. Tell your health care professional if your symptoms do not start to get better or if they get worse. Do not stop taking except on your health care professional's advice. You may develop a severe reaction. Your health care professional will tell you how much medicine to  take. Wear a medical ID bracelet or chain. Carry a card that describes your disease and details of your medicine and dosage times. This medicine can reduce the response of your body to heat or cold. Dress warm in cold weather and stay hydrated in hot weather. If possible, avoid extreme temperatures like saunas, hot tubs, very hot or cold showers, or activities that can cause dehydration such as vigorous exercise. Check with your health care professional if you have severe diarrhea, nausea, and vomiting, or if you sweat a lot. The loss of too much body fluid may make it dangerous for you to take this medicine. You may get drowsy or dizzy. Do not drive, use machinery, or do anything that needs mental alertness until you know how this medicine affects you. Do not stand up or sit up quickly, especially if you are an older patient. This reduces the risk of dizzy or fainting spells. Alcohol may interfere with the effect of this medicine. Avoid alcoholic drinks. Tell your health care professional right away if you have any change in your eyesight. Patients and their families should watch out for new or worsening depression or thoughts of suicide. Also watch out for sudden changes in feelings such as feeling anxious, agitated, panicky, irritable, hostile, aggressive, impulsive, severely restless, overly excited and hyperactive, or not being able to sleep. If this happens, especially at the beginning of treatment or after a change in dose, call your healthcare professional. This medicine may cause serious skin reactions. They can happen weeks to months after starting the medicine. Contact your health care provider right away if you notice fevers or flu-like symptoms with a rash. The rash may be red or purple and then turn into blisters or peeling of the skin. Or, you might notice a red rash with swelling of the face, lips or lymph nodes in your neck or under your arms. Birth control may not work properly while you  are taking this medicine. Talk to your health care professional about using an extra method of birth control. Women should inform their health care professional if they wish to become pregnant or think they might be pregnant. There is a potential for serious side effects and harm to an unborn child. Talk to your health care professional for more information. What side effects may I notice from receiving this medicine? Side effects that you should report to your doctor or health care professional as soon as possible:  allergic reactions like skin rash, itching or hives, swelling of the face, lips, or tongue  blood in the urine  changes in vision  confusion  loss of memory  pain in lower back or side  pain when urinating  redness, blistering, peeling or loosening of the skin, including inside the mouth  signs and symptoms of bleeding such as bloody or black, tarry stools; red or dark brown  urine; spitting up blood or brown material that looks like coffee grounds; red spots on the skin; unusual bruising or bleeding from the eyes, gums, or nose  signs and symptoms of increased acid in the body like breathing fast; fast heartbeat; headache; confusion; unusually weak or tired; nausea, vomiting  suicidal thoughts, mood changes  trouble speaking or understanding  unusual sweating  unusually weak or tired Side effects that usually do not require medical attention (report to your doctor or health care professional if they continue or are bothersome):  dizziness  drowsiness  fever  loss of appetite  nausea, vomiting  pain, tingling, numbness in the hands or feet  stomach pain  tiredness  upset stomach This list may not describe all possible side effects. Call your doctor for medical advice about side effects. You may report side effects to FDA at 1-800-FDA-1088. Where should I keep my medicine? Keep out of the reach of children and pets. Store between 15 and 30 degrees C  (59 and 86 degrees F). Protect from moisture. Keep the container tightly closed. Get rid of any unused medicine after the expiration date. To get rid of medicines that are no longer needed or have expired:  Take the medicine to a medicine take-back program. Check with your pharmacy or law enforcement to find a location.  If you cannot return the medicine, check the label or package insert to see if the medicine should be thrown out in the garbage or flushed down the toilet. If you are not sure, ask your health care provider. If it is safe to put it in the trash, empty the medicine out of the container. Mix the medicine with cat litter, dirt, coffee grounds, or other unwanted substance. Seal the mixture in a bag or container. Put it in the trash. NOTE: This sheet is a summary. It may not cover all possible information. If you have questions about this medicine, talk to your doctor, pharmacist, or health care provider.  2021 Elsevier/Gold Standard (2020-04-12 15:41:57)

## 2020-12-12 NOTE — Progress Notes (Addendum)
PATIENT: [name] DOB: 12/17/1960  REASON FOR VISIT: follow up HISTORY FROM: patient  HISTORY OF PRESENT ILLNESS: Today 12/12/20:  [name] is a 60 year old female with a history of altered awareness, migraine headache. She returns today still with a daily headache.  In the past Dr. Lavell Anchors has tried a prednisone Dosepak, indomethacin, axert, nurtec and migraine infusion but this has not broken her headache cycle.  She does state that with the migraine infusion she did go a day and a half without headache.  In the last 2 weeks she has been diagnosed with an abscessed tooth.  She was originally placed on penicillin and had a tooth removed today.  She describes her headache today as a dull headache 2 out of 10 on the pain scale.  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  HISTORY (copied from Dr. Cathren Laine note)  [name] is a 60 y.o. female here as requested by  Shelly Coss MD for migraines. PMHx von Willebrand's disease, ray nodes, peripheral neuropathy, migraine, insomnia, IBS, hyperlipidemia, depression, back pain, arthritis, anxiety.  I reviewed Shelly Coss MDs discharge summary from November 15, 2020: Patient presented to the emergency room secondary to transient left-sided weakness, dizziness and visual defects, by the time physician saw her her symptoms were completely resolved and she felt back to normal, symptoms were present for several hours, she was last seen well when waking up in the morning, she expressed having episodes after showering when her leg gave way, while driving to work she experienced diplopia and triple vision, Rx to experienced a period of time according to her 20 minutes or so of transient amnesia, CT scan of the head was without acute abnormality, negative MRI, normal blood work, TIA/stroke work-up was negative, her symptoms were thought to be secondary to status migrainosus, she was given  multiple doses of migraine cocktail during hospitalization, she was discharged on Maxalt and valproic acid.  She feels "spacey", she still has a headache, has had it since she was seen at the ER, she was driving and she says she had triple vision and closed one eye and it resolved, she has had a headache for 2 months and she has had a headache since her friend passed away thanksgiving, she cared for her friend's mother who dies in hospice (she gave her morphine throught the night) she didn't sleep for several days helping out and a few days later had the incident driving and triple vision, she had a headache at the time (had been ongoing for months in the setting of stress), she wakes up with migraine, better standing, waxes and wanes, prior to a few months ago she would get a headache once a month and caffeine and analgesic may help, but she would start with a visual problem, looks like a 70s wavy lines in both eyes, may not progress to a headache if she catches it on time. But this has been different. She lost 3 hours, she was at work, she was told she was appropriate ut doesn't remember 3 hours. Not doing well on Depakote. She is taking the maxalt 4-5 times worried the headache will come back. Migraine cocktail inpatient helped, she slept the first night int he hospital, she had severe light sensitivity, she had benadryl, compazine, decadron she felt better prior to going home but the headache has returned but not as severe since she came home. maxalt is helping.  Reviewed notes, labs and imaging from outside physicians, which showed:  From a thorough review of records, medications tried that can be used in migraine management includes Tylenol, aspirin, Fioricet, Flexeril, Decadron injections, Benadryl injections, Depakote, ibuprofen, ketorolac injections, magnesium, Depo-Medrol injection, Reglan injections, metoprolol, Zofran tablets and injections, Compazine injections, rizatriptan,  Effexor.   B12 373, TSH 2.6, hemoglobin A1c 5.2, LDL 134, CBC normal, BMP unremarkable, HIV negative.   MRI brain 11/13/2020: FINDINGS: Brain: Ventricle size and cerebral volume within normal limits. Several small hyperintensities in the left frontal lobe. Brainstem and cerebellum normal.  Negative for acute infarct. Negative for hemorrhage or mass.  Vascular: Normal arterial flow voids.  Skull and upper cervical spine: Negative  Sinuses/Orbits: Paranasal sinuses clear negative orbit  Other: None  IMPRESSION: No acute abnormality. Mild white matter changes most likely due to chronic ischemia.  CTA H&N: IMPRESSION: 11/13/2020 No large vessel occlusion or hemodynamically significant proximal stenosis in the head or neck.  IMPRESSION: This study is within normal limits. No seizures or epileptiform discharges were seen throughout the recording.   REVIEW OF SYSTEMS: Out of a complete 14 system review of symptoms, the patient complains only of the following symptoms, and all other reviewed systems are negative. See HPI  ALLERGIES: Allergies  Allergen Reactions  . Levaquin [Levofloxacin In D5w] Other (See Comments)    Tendonitis in shoulder, severe yeast infections  . Sulfa Antibiotics Other (See Comments)    Unknown reaction - childhood allergy    HOME MEDICATIONS: Outpatient Medications Prior to Visit  Medication Sig Dispense Refill  . almotriptan (AXERT) 12.5 MG tablet Take 1 tablet (12.5 mg total) by mouth in the morning and at bedtime. 9 tablet 0  . cholecalciferol (VITAMIN D3) 25 MCG (1000 UNIT) tablet Take 1,000 Units by mouth daily.    . Cyanocobalamin (VITAMIN B12) 1000 MCG TBCR Take 1 tablet by mouth daily.    . diclofenac Sodium (VOLTAREN) 1 % GEL Apply 2 g topically as needed.    Marland Kitchen GLUTATHIONE PO Take by mouth at bedtime.    . indomethacin (INDOCIN) 50 MG capsule Take 1 capsule (50 mg total) by mouth 3 (three) times daily with meals. For 5 days. 15  capsule 0  . ipratropium (ATROVENT) 0.06 % nasal spray Place 2 sprays into both nostrils as needed for rhinitis.    . metoprolol succinate (TOPROL XL) 25 MG 24 hr tablet Take 1 tablet (25 mg total) by mouth daily. 90 tablet 3  . Omega 3 340 MG CPDR Take 2 capsules by mouth daily.    . ondansetron (ZOFRAN) 8 MG tablet Take 1 tablet (8 mg total) by mouth daily as needed for nausea or vomiting. 30 tablet 0  . Probiotic Product (PROBIOTIC PO) Take 1 capsule by mouth daily.    . progesterone (PROMETRIUM) 100 MG capsule Take 1 capsule (100 mg total) by mouth at bedtime. 30 capsule 11  . saccharomyces boulardii (FLORASTOR) 250 MG capsule Take by mouth. 2 per day    . TRINTELLIX 10 MG TABS tablet Take 10 mg by mouth daily.    Marland Kitchen UNABLE TO FIND Med Name: CPVII    . VIVELLE-DOT 0.1 MG/24HR patch Place 1 patch (0.1 mg total) onto the skin 2 (two) times a week. (Patient taking differently: Place 1 patch onto the skin 2 (two) times a week. Sundays and Wednesdays) 8 patch 12  . Zinc 50 MG TABS Take 1 tablet by mouth every Monday, Wednesday, and Friday.    . zolpidem (  AMBIEN) 10 MG tablet Take 1 tablet (10 mg total) by mouth at bedtime as needed for sleep. (Patient taking differently: Take 10 mg by mouth at bedtime.) 90 tablet 4  . Rimegepant Sulfate (NURTEC) 75 MG TBDP Take 75 mg by mouth daily as needed. For migraines. Take as close to onset of migraine as possible. One daily maximum. 14 tablet 0  . rizatriptan (MAXALT) 10 MG tablet Take 1 tablet (10 mg total) by mouth once as needed for migraine. May repeat in 2 hours if needed.Dont take more than 30 mg in 24 hr 30 tablet 0  . divalproex (DEPAKOTE ER) 250 MG 24 hr tablet Take 1 tablet (250 mg total) by mouth at bedtime for 7 days. 7 tablet 0  . methylPREDNISolone (MEDROL DOSEPAK) 4 MG TBPK tablet Take pills daily with food for 6 days 21 tablet 1   No facility-administered medications prior to visit.    PAST MEDICAL HISTORY: Past Medical History:  Diagnosis  Date  . Anxiety   . Arthritis   . Back pain   . Chronic rhinitis   . Complication of anesthesia   . Depression   . Family history of colonic polyps   . Hepatic hemangioma   . Hyperlipidemia   . IBS (irritable bowel syndrome)   . Insomnia   . Migraine   . Osteoarthritis, foot, localized    left  . Perimenopause   . Peripheral neuropathy   . PONV (postoperative nausea and vomiting)   . Raynauds syndrome   . Seasonal allergies   . Von Willebrand's disease ([place])    bruises easily    PAST SURGICAL HISTORY: Past Surgical History:  Procedure Laterality Date  . BONE EXOSTOSIS EXCISION Left 02/24/2019   Procedure: Dorsal Exostectomy;  Surgeon: Wylene Simmer, MD;  Location: [facility];  Service: Orthopedics;  Laterality: Left;  . CESAREAN SECTION    . laparoscopy removal of right ovarian cyst    . left knee arthroscopy    . NOVASURE ABLATION  12/2005  . pylonidal cyst removal    . SEPTOPLASTY      FAMILY HISTORY: Family History  Problem Relation Age of Onset  . Hypertension Mother   . Arthritis Mother   . Hypertension Father   . Heart disease Father   . Lupus Sister   . Other Sister        mythensia gravis    SOCIAL HISTORY: Social History   Socioeconomic History  . Marital status: Married    Spouse name: Not on file  . Number of children: Not on file  . Years of education: Not on file  . Highest education level: Not on file  Occupational History  . Not on file  Tobacco Use  . Smoking status: Former Smoker    Years: 10.00    Types: Cigarettes  . Smokeless tobacco: Never Used  Vaping Use  . Vaping Use: Never used  Substance and Sexual Activity  . Alcohol use: Yes    Alcohol/week: 1.0 - 2.0 standard drink    Types: 1 - 2 Standard drinks or equivalent per week    Comment: wine or vodka; was previously 1-2 per night, but liver enzymes up so she has cut back  . Drug use: No  . Sexual activity: Yes    Partners: Male    Comment: husband  vasectomy-1st intercourse 34 yo-5 partners  Other Topics Concern  . Not on file  Social History Narrative   Lives at home with husband  and 2 children who are freshman in college   Right handed   Caffeine: maybe 1 cup/day   Social Determinants of Health   Financial Resource Strain: Not on file  Food Insecurity: Not on file  Transportation Needs: Not on file  Physical Activity: Not on file  Stress: Not on file  Social Connections: Not on file  Intimate Partner Violence: Not on file      PHYSICAL EXAM  Vitals:   12/12/20 1054  BP: 126/77  Pulse: 67  Weight: 183 lb (83 kg)  Height: 5\' 8"  (1.727 m)   Body mass index is 27.83 kg/m.  Generalized: Well developed, in no acute distress   Neurological examination  Mentation: Alert oriented to time, place, history taking. Follows all commands speech and language fluent Cranial nerve II-XII: Pupils were equal round reactive to light. Extraocular movements were full, visual field were full on confrontational test. Facial sensation and strength were normal. Uvula tongue midline. Head turning and shoulder shrug  were normal and symmetric. Motor: The motor testing reveals 5 over 5 strength of all 4 extremities. Good symmetric motor tone is noted throughout.  Sensory: Sensory testing is intact to soft touch on all 4 extremities. No evidence of extinction is noted.  Coordination: Cerebellar testing reveals good finger-nose-finger and heel-to-shin bilaterally.  Gait and station: Gait is normal.  Reflexes: Deep tendon reflexes are symmetric and normal bilaterally.   DIAGNOSTIC DATA (LABS, IMAGING, TESTING) - I reviewed patient records, labs, notes, testing and imaging myself where available.  Lab Results  Component Value Date   WBC 5.9 11/13/2020   HGB 13.1 11/13/2020   HCT 40.3 11/13/2020   MCV 96.0 11/13/2020   PLT 182 11/13/2020      Component Value Date/Time   NA 137 11/13/2020 0811   NA 138 12/09/2019 1052   K 4.1  11/13/2020 0811   CL 102 11/13/2020 0811   CO2 27 11/13/2020 0811   GLUCOSE 105 (H) 11/13/2020 0811   BUN 15 11/13/2020 0811   BUN 15 12/09/2019 1052   CREATININE 0.68 11/13/2020 0811   CREATININE 0.82 08/22/2014 1130   CALCIUM 8.9 11/13/2020 0811   PROT 6.4 08/22/2014 1130   ALBUMIN 4.1 08/22/2014 1130   AST 20 08/22/2014 1130   ALT 23 08/22/2014 1130   ALKPHOS 57 08/22/2014 1130   BILITOT 0.3 08/22/2014 1130   GFRNONAA >60 11/13/2020 0811   GFRAA 79 12/09/2019 1052   Lab Results  Component Value Date   CHOL 215 (H) 11/14/2020   HDL 58 11/14/2020   LDLCALC 134 (H) 11/14/2020   TRIG 116 11/14/2020   CHOLHDL 3.7 11/14/2020   Lab Results  Component Value Date   HGBA1C 5.2 11/14/2020   Lab Results  Component Value Date   VITAMINB12 373 11/13/2020   Lab Results  Component Value Date   TSH 2.602 11/13/2020      ASSESSMENT AND PLAN 60 y.o. year old female  has a past medical history of Anxiety, Arthritis, Back pain, Chronic rhinitis, Complication of anesthesia, Depression, Family history of colonic polyps, Hepatic hemangioma, Hyperlipidemia, IBS (irritable bowel syndrome), Insomnia, Migraine, Osteoarthritis, foot, localized, Perimenopause, Peripheral neuropathy, PONV (postoperative nausea and vomiting), Raynauds syndrome, Seasonal allergies, and Von Willebrand's disease ([place]). here with:  1.  Persistent daily headache  The patient is continued to have a daily headache-the severity varies.  Advised patient that her ongoing headache may be due to abscessed tooth?  Since she just had this removed today I advised that she  should take the next couple days to rest and see if her headaches resolved.  If she continues to have daily headaches we may need to consider adding on a preventative medication such as Topamax or nortriptyline.  Patient voiced understanding.  Also discussed Toradol injection today but she deferred.  She will return in 4 weeks with Dr. Jaynee Eagles   I spent 30  minutes of face-to-face and non-face-to-face time with patient.  This included previsit chart review, lab review, study review, order entry, electronic health record documentation, patient education.  Ward Givens, MSN, NP-C 12/12/2020, 11:02 AM [facility] [address], [place], [place] [address] [phone]  Made any corrections needed, and agree with history, physical, neuro exam,assessment and plan as stated.     Sarina Ill, MD [facility]

## 2020-12-19 ENCOUNTER — Encounter: Payer: Self-pay | Admitting: Adult Health

## 2020-12-19 DIAGNOSIS — Z760 Encounter for issue of repeat prescription: Secondary | ICD-10-CM | POA: Diagnosis not present

## 2020-12-20 ENCOUNTER — Other Ambulatory Visit: Payer: Self-pay | Admitting: Neurology

## 2020-12-24 ENCOUNTER — Other Ambulatory Visit (HOSPITAL_COMMUNITY): Payer: Self-pay | Admitting: Dentistry

## 2020-12-26 ENCOUNTER — Other Ambulatory Visit (HOSPITAL_COMMUNITY): Payer: Self-pay | Admitting: Dentistry

## 2021-01-04 ENCOUNTER — Other Ambulatory Visit (HOSPITAL_BASED_OUTPATIENT_CLINIC_OR_DEPARTMENT_OTHER): Payer: Self-pay

## 2021-01-07 ENCOUNTER — Other Ambulatory Visit: Payer: Self-pay | Admitting: Neurology

## 2021-01-07 ENCOUNTER — Ambulatory Visit (INDEPENDENT_AMBULATORY_CARE_PROVIDER_SITE_OTHER): Payer: 59 | Admitting: Neurology

## 2021-01-07 ENCOUNTER — Encounter: Payer: Self-pay | Admitting: Neurology

## 2021-01-07 VITALS — BP 102/60 | HR 78 | Ht 68.0 in | Wt 184.0 lb

## 2021-01-07 DIAGNOSIS — G43701 Chronic migraine without aura, not intractable, with status migrainosus: Secondary | ICD-10-CM

## 2021-01-07 DIAGNOSIS — G43101 Migraine with aura, not intractable, with status migrainosus: Secondary | ICD-10-CM

## 2021-01-07 MED ORDER — RIZATRIPTAN BENZOATE 10 MG PO TBDP: 10.0000 mg | ORAL_TABLET | ORAL | 11 refills | Status: DC | PRN

## 2021-01-07 MED ORDER — AJOVY 225 MG/1.5ML ~~LOC~~ SOAJ: 225.0000 mg | SUBCUTANEOUS | 0 refills | Status: DC

## 2021-01-07 MED FILL — RIZATRIPTAN 10 MG ODT: 10 | 26 days supply | Qty: 9 | Fill #0

## 2021-01-07 NOTE — Patient Instructions (Signed)
Preventative: [name] Aciute:Rizatriptan: Please take one tablet at the onset of your headache. If it does not improve the symptoms please take one additional tablet. Do not take more then 2 tablets in 24hrs. Do not take use more then 2 to 3 times in a week.   Fremanezumab injection What is this medicine? FREMANEZUMAB (fre ma NEZ ue mab) is used to prevent migraine headaches. This medicine may be used for other purposes; ask your health care provider or pharmacist if you have questions. COMMON BRAND NAME(S): [name] What should I tell my health care provider before I take this medicine? They need to know if you have any of these conditions:  an unusual or allergic reaction to fremanezumab, other medicines, foods, dyes, or preservatives  pregnant or trying to get pregnant  breast-feeding How should I use this medicine? This medicine is for injection under the skin. You will be taught how to prepare and give this medicine. Use exactly as directed. Take your medicine at regular intervals. Do not take your medicine more often than directed. It is important that you put your used needles and syringes in a special sharps container. Do not put them in a trash can. If you do not have a sharps container, call your pharmacist or healthcare provider to get one. Talk to your pediatrician regarding the use of this medicine in children. Special care may be needed. Overdosage: If you think you have taken too much of this medicine contact a poison control center or emergency room at once. NOTE: This medicine is only for you. Do not share this medicine with others. What if I miss a dose? If you miss a dose, take it as soon as you can. If it is almost time for your next dose, take only that dose. Do not take double or extra doses. What may interact with this medicine? Interactions are not expected. This list may not describe all possible interactions. Give your health care provider a list of all the medicines,  herbs, non-prescription drugs, or dietary supplements you use. Also tell them if you smoke, drink alcohol, or use illegal drugs. Some items may interact with your medicine. What should I watch for while using this medicine? Tell your doctor or healthcare professional if your symptoms do not start to get better or if they get worse. What side effects may I notice from receiving this medicine? Side effects that you should report to your doctor or health care professional as soon as possible:  allergic reactions like skin rash, itching or hives, swelling of the face, lips, or tongue Side effects that usually do not require medical attention (report these to your doctor or health care professional if they continue or are bothersome):  pain, redness, or irritation at site where injected This list may not describe all possible side effects. Call your doctor for medical advice about side effects. You may report side effects to FDA at 1-800-FDA-1088. Where should I keep my medicine? Keep out of the reach of children. You will be instructed on how to store this medicine. Throw away any unused medicine after the expiration date on the label. NOTE: This sheet is a summary. It may not cover all possible information. If you have questions about this medicine, talk to your doctor, pharmacist, or health care provider.  2021 Elsevier/Gold Standard (2017-06-29 17:22:56)

## 2021-01-07 NOTE — Progress Notes (Signed)
[facility]    Provider:  Dr Jaynee Eagles Requesting Provider: Maurice Small, MD, Shelly Coss MD (inpatient attending). Primary Care Provider:  Maurice Small, MD  CC:  Episode of altered mental status  01/07/2021: She has had a headache since she left. It is not getting better. She is having the migraines daily, the brain feels bruised, in the left eye, it may seem like it goes away but it comes back with any stress, light makes it worse.  She had the abscess found in the left side of her jaw, it is now improved but she continues to have headaches.  She did have a migraine cocktail in our office and that helped significantly but again can still continues to have headaches.  She tried the Axert, she tried the Maxalt, she tried and Nurtec, and still having headaches.  We discussed at this time it would probably be best for her to start a preventative, I gave her [name] samples, we injected the first 1 in the office, we will check back in with her in 10 weeks and see how she is feeling.    HPI:  [name] is a 60 y.o. female here as requested by  Shelly Coss MD for migraines. PMHx von Willebrand's disease, ray nodes, peripheral neuropathy, migraine, insomnia, IBS, hyperlipidemia, depression, back pain, arthritis, anxiety.  I reviewed Shelly Coss MDs discharge summary from November 15, 2020: Patient presented to the emergency room secondary to transient left-sided weakness, dizziness and visual defects, by the time physician saw her her symptoms were completely resolved and she felt back to normal, symptoms were present for several hours, she was last seen well when waking up in the morning, she expressed having episodes after showering when her leg gave way, while driving to work she experienced diplopia and triple vision, Rx to experienced a period of time according to her 20 minutes or so of transient amnesia, CT scan of the head was without acute abnormality, negative MRI, normal  blood work, TIA/stroke work-up was negative, her symptoms were thought to be secondary to status migrainosus, she was given multiple doses of migraine cocktail during hospitalization, she was discharged on Maxalt and valproic acid.  She feels "spacey", she still has a headache, has had it since she was seen at the ER, she was driving and she says she had triple vision and closed one eye and it resolved, she has had a headache for 2 months and she has had a headache since her friend passed away thanksgiving, she cared for her friend's mother who dies in hospice (she gave her morphine throught the night) she didn't sleep for several days helping out and a few days later had the incident driving and triple vision, she had a headache at the time (had been ongoing for months in the setting of stress), she wakes up with migraine, better standing, waxes and wanes, prior to a few months ago she would get a headache once a month and caffeine and analgesic may help, but she would start with a visual problem, looks like a 70s wavy lines in both eyes, may not progress to a headache if she catches it on time. But this has been different. She lost 3 hours, she was at work, she was told she was appropriate ut doesn't remember 3 hours. Not doing well on Depakote. She is taking the maxalt 4-5 times worried the headache will come back. Migraine cocktail inpatient helped, she slept the first night int he hospital, she  had severe light sensitivity, she had benadryl, compazine, decadron she felt better prior to going home but the headache has returned but not as severe since she came home. maxalt is helping.   Reviewed notes, labs and imaging from outside physicians, which showed:  From a thorough review of records, medications tried that can be used in migraine management includes Tylenol, aspirin, Fioricet, Flexeril, Decadron injections, Benadryl injections, Depakote, ibuprofen, ketorolac injections, magnesium, Depo-Medrol  injection, Reglan injections, metoprolol, Zofran tablets and injections, Compazine injections, rizatriptan, Effexor.   B12 373, TSH 2.6, hemoglobin A1c 5.2, LDL 134, CBC normal, BMP unremarkable, HIV negative.   MRI brain 11/13/2020: FINDINGS: Brain: Ventricle size and cerebral volume within normal limits. Several small hyperintensities in the left frontal lobe. Brainstem and cerebellum normal.  Negative for acute infarct.  Negative for hemorrhage or mass.  Vascular: Normal arterial flow voids.  Skull and upper cervical spine: Negative  Sinuses/Orbits: Paranasal sinuses clear negative orbit  Other: None  IMPRESSION: No acute abnormality. Mild white matter changes most likely due to chronic ischemia.  CTA H&N: IMPRESSION: 11/13/2020 No large vessel occlusion or hemodynamically significant proximal stenosis in the head or neck.  IMPRESSION: This study is within normal limits. No seizures or epileptiform discharges were seen throughout the recording.   Review of Systems: Patient complains of symptoms per HPI as well as the following symptoms headache. Pertinent negatives and positives per HPI. All others negative.   Social History   Socioeconomic History  . Marital status: Married    Spouse name: Not on file  . Number of children: Not on file  . Years of education: Not on file  . Highest education level: Not on file  Occupational History  . Not on file  Tobacco Use  . Smoking status: Former Smoker    Years: 10.00    Types: Cigarettes  . Smokeless tobacco: Never Used  Vaping Use  . Vaping Use: Never used  Substance and Sexual Activity  . Alcohol use: Yes    Alcohol/week: 1.0 - 2.0 standard drink    Types: 1 - 2 Standard drinks or equivalent per week    Comment: wine or vodka; was previously 1-2 per night, but liver enzymes up so she has cut back  . Drug use: No  . Sexual activity: Yes    Partners: Male    Comment: husband vasectomy-1st intercourse 36 yo-5  partners  Other Topics Concern  . Not on file  Social History Narrative   Lives at home with husband and 2 children who are freshman in college   Right handed   Caffeine: maybe 1 cup/day   Social Determinants of Health   Financial Resource Strain: Not on file  Food Insecurity: Not on file  Transportation Needs: Not on file  Physical Activity: Not on file  Stress: Not on file  Social Connections: Not on file  Intimate Partner Violence: Not on file    Family History  Problem Relation Age of Onset  . Hypertension Mother   . Arthritis Mother   . Hypertension Father   . Heart disease Father   . Lupus Sister   . Other Sister        Paulina Fusi    Past Medical History:  Diagnosis Date  . Anxiety   . Arthritis   . Back pain   . Chronic rhinitis   . Complication of anesthesia   . Depression   . Family history of colonic polyps   . Hepatic hemangioma   .  Hyperlipidemia   . IBS (irritable bowel syndrome)   . Insomnia   . Migraine   . Osteoarthritis, foot, localized    left  . Perimenopause   . Peripheral neuropathy   . PONV (postoperative nausea and vomiting)   . Raynauds syndrome   . Seasonal allergies   . Von Willebrand's disease ([place])    bruises easily    Patient Active Problem List   Diagnosis Date Noted  . Chronic migraine without aura with status migrainosus, not intractable 01/07/2021  . Migraine with aura and with status migrainosus, not intractable 11/20/2020  . TIA (transient ischemic attack) 11/13/2020  . Sinus tachycardia 12/09/2019  . Hypertension 12/09/2019    Past Surgical History:  Procedure Laterality Date  . BONE EXOSTOSIS EXCISION Left 02/24/2019   Procedure: Dorsal Exostectomy;  Surgeon: Wylene Simmer, MD;  Location: [facility];  Service: Orthopedics;  Laterality: Left;  . CESAREAN SECTION    . laparoscopy removal of right ovarian cyst    . left knee arthroscopy    . NOVASURE ABLATION  12/2005  . pylonidal cyst removal     . SEPTOPLASTY    . TOOTH EXTRACTION  12/2020    Current Outpatient Medications  Medication Sig Dispense Refill  . cholecalciferol (VITAMIN D3) 25 MCG (1000 UNIT) tablet Take 1,000 Units by mouth daily.    . Cyanocobalamin (VITAMIN B12) 1000 MCG TBCR Take 1 tablet by mouth daily.    . diclofenac Sodium (VOLTAREN) 1 % GEL Apply 2 g topically as needed.    . Fremanezumab-vfrm ([name]) 225 MG/1.5ML SOAJ Inject 225 mg into the skin every 30 (thirty) days. 4.5 mL 0  . GLUTATHIONE PO Take by mouth at bedtime.    . indomethacin (INDOCIN) 50 MG capsule Take 1 capsule (50 mg total) by mouth 3 (three) times daily with meals. For 5 days. 15 capsule 0  . ipratropium (ATROVENT) 0.06 % nasal spray Place 2 sprays into both nostrils as needed for rhinitis.    . metoprolol succinate (TOPROL XL) 25 MG 24 hr tablet Take 1 tablet (25 mg total) by mouth daily. 90 tablet 3  . Omega 3 340 MG CPDR Take 2 capsules by mouth daily.    . ondansetron (ZOFRAN) 8 MG tablet Take 1 tablet (8 mg total) by mouth daily as needed for nausea or vomiting. 30 tablet 0  . Probiotic Product (PROBIOTIC PO) Take 1 capsule by mouth daily.    . rizatriptan (MAXALT-MLT) 10 MG disintegrating tablet Take 1 tablet (10 mg total) by mouth as needed for migraine. May repeat in 2 hours if needed 9 tablet 11  . saccharomyces boulardii (FLORASTOR) 250 MG capsule Take by mouth. 2 per day    . TRINTELLIX 10 MG TABS tablet Take 10 mg by mouth daily.    Marland Kitchen UNABLE TO FIND Med Name: CPVII    . Zinc 50 MG TABS Take 1 tablet by mouth every Monday, Wednesday, and Friday.    . zolpidem (AMBIEN) 10 MG tablet Take 1 tablet (10 mg total) by mouth at bedtime as needed for sleep. (Patient taking differently: Take 10 mg by mouth at bedtime.) 90 tablet 4  . almotriptan (AXERT) 12.5 MG tablet TAKE 1 TABLET BY MOUTH IN THE MORNING AND AT BEDTIME. (Patient not taking: Reported on 01/07/2021) 9 tablet 0  . progesterone (PROMETRIUM) 100 MG capsule Take 1 capsule (100  mg total) by mouth at bedtime. (Patient not taking: Reported on 01/07/2021) 30 capsule 11  . VIVELLE-DOT 0.1  MG/24HR patch Place 1 patch (0.1 mg total) onto the skin 2 (two) times a week. (Patient not taking: Reported on 01/07/2021) 8 patch 12   No current facility-administered medications for this visit.    Allergies as of 01/07/2021 - Review Complete 01/07/2021  Allergen Reaction Noted  . Levaquin [levofloxacin in d5w] Other (See Comments) 07/01/2018  . Sulfa antibiotics Other (See Comments)     Vitals: BP 102/60   Pulse 78   Ht 5\' 8"  (1.727 m)   Wt 184 lb (83.5 kg)   LMP 11/14/2012   BMI 27.98 kg/m  Last Weight:  Wt Readings from Last 1 Encounters:  01/07/21 184 lb (83.5 kg)   Last Height:   Ht Readings from Last 1 Encounters:  01/07/21 5\' 8"  (1.727 m)     Physical exam: Exam: Gen: NAD, conversant, well nourised,  well groomed                     CV: RRR, no MRG. No Carotid Bruits. No peripheral edema, warm, nontender Eyes: Conjunctivae clear without exudates or hemorrhage  Neuro: Detailed Neurologic Exam  Speech:    Speech is normal; fluent and spontaneous with normal comprehension.  Cognition:    The patient is oriented to person, place, and time;     recent and remote memory intact;     language fluent;     normal attention, concentration,     fund of knowledge Cranial Nerves:    The pupils are equal, round, and reactive to light. Pupils too small to visualize fundi. Visual fields are full to finger confrontation. Extraocular movements are intact. Trigeminal sensation is intact and the muscles of mastication are normal. The face is symmetric. The palate elevates in the midline. Hearing intact. Voice is normal. Shoulder shrug is normal. The tongue has normal motion without fasciculations.   Coordination:    No dysmetria or ataxia  Gait:   Normal native gait  Motor Observation:    No asymmetry, no atrophy, and no involuntary movements noted. Tone:     Normal muscle tone.    Posture:    Posture is normal. normal erect    Strength:    Strength is V/V in the upper and lower limbs.      Sensation: intact to LT     Reflex Exam:  DTR's:    Deep tendon reflexes in the upper and lower extremities are normal bilaterally.   Toes:    The toes are downgoing bilaterally.   Clonus:    Clonus is absent.    Assessment/Plan:  Patient with hx of migraine with aura, does not recall 3 hours of time (cannot recall anything that happened) in the setting of extreme emotional distress and headache. Stroke wkup inpatient unrevealing, was attributed to migraine. I suspect Transient Global Amnesia, less likely due to migraine or seizure.  - She is on HRT and she has migraines with aura and an episode of transient memory loss/amnesia. Do not recommend HRT due to stroke risk factors.  -TCD and EEG was negative. -She found an abscess of tooth, that is now improved, headaches have improved but not completely gone despite trying migraine cocktail (did significantly help in the office), Nurtec, Maxalt, Axert, steroids.  At this time I do recommend she start a preventative and I gave her some [name] samples, if she is feeling significantly better in 4 months we may be able to stop them and see if she goes back to her prior baseline. -  Maxalt acutely.  Prior: - TCD: There is increased prevalence of PFO in migraine with aura. Given possible TGA, if pfo found I would recommend closure - Needs to manage ldl <70, HgbA1c < 6.5 - EEG - EEG normal inpatient. Repeat to increase sensitivity however seizure less likely (she was functional during the hours she can;t remembre and at work) - Daily asa 81mg  - can consider but has von willibran disease, will see what the workup shows - Medrol dosepak and nurtec to try and break the headache however may consider ongoing migraine daily prevention if headache does not resolve. Stop Depakote (side effects)    No orders of the defined  types were placed in this encounter.  Meds ordered this encounter  Medications  . rizatriptan (MAXALT-MLT) 10 MG disintegrating tablet    Sig: Take 1 tablet (10 mg total) by mouth as needed for migraine. May repeat in 2 hours if needed    Dispense:  9 tablet    Refill:  11  . Fremanezumab-vfrm ([name]) 225 MG/1.5ML SOAJ    Sig: Inject 225 mg into the skin every 30 (thirty) days.    Dispense:  4.5 mL    Refill:  0    Patient has copay card; she can have medication regardless of insurance approval or copay amount.    Cc: Maurice Small, MD,  Maurice Small, MD  Sarina Ill, MD  [facility] [address] [place], [place] [address]  Phone [phone] Fax [phone]  I spent over 30 minutes of face-to-face and non-face-to-face time with patient on the  1. Migraine with aura and with status migrainosus, not intractable   2. Chronic migraine without aura with status migrainosus, not intractable    diagnosis.  This included previsit chart review, lab review, study review, order entry, electronic health record documentation, patient education on the different diagnostic and therapeutic options, counseling and coordination of care, risks and benefits of management, compliance, or risk factor reduction

## 2021-01-15 MED FILL — Zolpidem Tartrate Tab 10 MG: ORAL | 90 days supply | Qty: 90 | Fill #0 | Status: AC

## 2021-01-16 ENCOUNTER — Other Ambulatory Visit (HOSPITAL_COMMUNITY): Payer: Self-pay

## 2021-01-22 ENCOUNTER — Other Ambulatory Visit (HOSPITAL_COMMUNITY): Payer: Self-pay

## 2021-01-24 ENCOUNTER — Other Ambulatory Visit: Payer: Self-pay | Admitting: Neurology

## 2021-01-24 ENCOUNTER — Other Ambulatory Visit (HOSPITAL_COMMUNITY): Payer: Self-pay

## 2021-01-24 MED ORDER — GABAPENTIN 300 MG PO CAPS
300.0000 mg | ORAL_CAPSULE | Freq: Three times a day (TID) | ORAL | 11 refills | Status: DC
  Filled 2021-01-24: qty 90, 30d supply, fill #0
  Filled 2021-02-17: qty 90, 30d supply, fill #1
  Filled 2021-03-22: qty 90, 30d supply, fill #2
  Filled 2021-05-06: qty 90, 30d supply, fill #3
  Filled 2021-06-11: qty 90, 30d supply, fill #4
  Filled 2021-07-16: qty 90, 30d supply, fill #5
  Filled 2021-08-22: qty 90, 30d supply, fill #6
  Filled 2021-09-19: qty 90, 30d supply, fill #7
  Filled 2021-10-25: qty 90, 30d supply, fill #8
  Filled 2021-12-12: qty 90, 30d supply, fill #9
  Filled 2022-01-18: qty 90, 30d supply, fill #10

## 2021-01-24 NOTE — Progress Notes (Signed)
gabapentin

## 2021-02-04 ENCOUNTER — Other Ambulatory Visit (HOSPITAL_COMMUNITY): Payer: Self-pay

## 2021-02-04 MED FILL — Rizatriptan Benzoate Oral Disintegrating Tab 10 MG (Base Eq): ORAL | 30 days supply | Qty: 9 | Fill #0 | Status: AC

## 2021-02-18 ENCOUNTER — Other Ambulatory Visit (HOSPITAL_COMMUNITY): Payer: Self-pay

## 2021-03-01 ENCOUNTER — Other Ambulatory Visit: Payer: Self-pay

## 2021-03-01 ENCOUNTER — Other Ambulatory Visit (HOSPITAL_COMMUNITY): Payer: Self-pay

## 2021-03-01 MED FILL — Vortioxetine HBr Tab 10 MG (Base Equiv): ORAL | 90 days supply | Qty: 90 | Fill #0 | Status: AC

## 2021-03-04 ENCOUNTER — Other Ambulatory Visit (HOSPITAL_COMMUNITY): Payer: Self-pay

## 2021-03-05 ENCOUNTER — Other Ambulatory Visit (HOSPITAL_COMMUNITY): Payer: Self-pay

## 2021-03-05 MED ORDER — METOPROLOL SUCCINATE ER 25 MG PO TB24
1.0000 | ORAL_TABLET | Freq: Every day | ORAL | 3 refills | Status: DC
  Filled 2021-03-05: qty 90, 90d supply, fill #0
  Filled 2021-05-29: qty 90, 90d supply, fill #1
  Filled 2021-08-12: qty 90, 90d supply, fill #2
  Filled 2021-10-25: qty 90, 90d supply, fill #3

## 2021-03-14 ENCOUNTER — Other Ambulatory Visit (HOSPITAL_COMMUNITY): Payer: Self-pay

## 2021-03-14 MED ORDER — CARESTART COVID-19 HOME TEST VI KIT
PACK | 0 refills | Status: DC
  Filled 2021-03-14: qty 4, 4d supply, fill #0

## 2021-03-22 ENCOUNTER — Other Ambulatory Visit (HOSPITAL_COMMUNITY): Payer: Self-pay

## 2021-04-03 DIAGNOSIS — M79672 Pain in left foot: Secondary | ICD-10-CM | POA: Diagnosis not present

## 2021-04-03 DIAGNOSIS — M19072 Primary osteoarthritis, left ankle and foot: Secondary | ICD-10-CM | POA: Diagnosis not present

## 2021-04-04 ENCOUNTER — Telehealth (INDEPENDENT_AMBULATORY_CARE_PROVIDER_SITE_OTHER): Payer: 59 | Admitting: Neurology

## 2021-04-04 DIAGNOSIS — G4750 Parasomnia, unspecified: Secondary | ICD-10-CM | POA: Diagnosis not present

## 2021-04-04 DIAGNOSIS — G43701 Chronic migraine without aura, not intractable, with status migrainosus: Secondary | ICD-10-CM

## 2021-04-04 DIAGNOSIS — R400 Somnolence: Secondary | ICD-10-CM

## 2021-04-04 MED ORDER — AJOVY 225 MG/1.5ML ~~LOC~~ SOAJ
225.0000 mg | SUBCUTANEOUS | 11 refills | Status: DC
  Filled 2021-04-04: qty 1.5, 30d supply, fill #0

## 2021-04-04 NOTE — Progress Notes (Signed)
[facility]    Provider:  Dr Jaynee Eagles Requesting Provider: Maurice Small, MD, Shelly Coss MD (inpatient attending). Primary Care Provider:  Maurice Small, MD  CC:  Episode of altered mental status, headache/migraines  Virtual Visit via Video Note  I connected with [name] on 04/04/21 at  2:30 PM EDT by a video enabled telemedicine application and verified that I am speaking with the correct person using two identifiers.  Location: Patient: home Provider: office   I discussed the limitations of evaluation and management by telemedicine and the availability of in person appointments. The patient expressed understanding and agreed to proceed.   Follow Up Instructions:    I discussed the assessment and treatment plan with the patient. The patient was provided an opportunity to ask questions and all were answered. The patient agreed with the plan and demonstrated an understanding of the instructions.   The patient was advised to call back or seek an in-person evaluation if the symptoms worsen or if the condition fails to improve as anticipated.  I provided 40 minutes of non-face-to-face time during this encounter.   Melvenia Beam, MD   04/04/2021: Patient is here for follow-up of transient global amnesia and new onset of headaches.  Patient was started on gabapentin which she feels was extremely helpful for her headaches however it is making her gain weight, and also help with her hot flashes.  She is also on [name]. She thinks the gabapentin has helped her headaches. She desn't think it was the [name]. She was taking it $RemoveB'300mg'GVSJaTiS$  bid and $Remov'600mg'EYiGJZ$  qhs. Now she is taking it more at night and back to taking $RemoveB'300mg'KvUICpJy$  at bedtime.  She was doing extremely well with her headaches however over the last week there has been some stress and she has recurrent headaches.  I encouraged her to continue the Emgality and we can follow-up again in 3 months.  She can continue the  gabapentin at a lower dose.  Patient had a very strange episode where she ordered from [facility] at 315 and 415 in the morning and doesn't remember doing it. She does take [place] but this has never happened to her.  We discussed could be a medication effect, I find it highly unlikely that she could sign onto [place] and in order items if she were having a seizure even if it were complex partial, but cannot rule it out.  - we will continue the ajovy, take benadryl prior for any reactions.  If insurance prefers [place] we can order that instead.  - she is taking $RemoveB'300mg'nGfmVSUa$  qam and 300qpm helping with the hot flashes but she is gaining weight so she is going to see obgyn to help with hot flashes. She thinks gabapentin helped her headaches unfortunately.   - she wake often in the middle of the night, she is extremely fatigued during the day, we could send to dr dohmeier and during the sleep study monitor eeg    01/07/2021: She has had a headache since she left. It is not getting better. She is having the migraines daily, the brain feels bruised, in the left eye, it may seem like it goes away but it comes back with any stress, light makes it worse.  She had the abscess found in the left side of her jaw, it is now improved but she continues to have headaches.  She did have a migraine cocktail in our office and that helped significantly but again can still continues to have headaches.  She tried the Axert, she tried the Maxalt, she tried and Nurtec, and still having headaches.  We discussed at this time it would probably be best for her to start a preventative, I gave her [name] samples, we injected the first 1 in the office, we will check back in with her in 10 weeks and see how she is feeling.    HPI:  [name] is a 60 y.o. female here as requested by  Shelly Coss MD for migraines. PMHx von Willebrand's disease, ray nodes, peripheral neuropathy, migraine, insomnia, IBS, hyperlipidemia, depression, back  pain, arthritis, anxiety.  I reviewed Shelly Coss MDs discharge summary from November 15, 2020: Patient presented to the emergency room secondary to transient left-sided weakness, dizziness and visual defects, by the time physician saw her her symptoms were completely resolved and she felt back to normal, symptoms were present for several hours, she was last seen well when waking up in the morning, she expressed having episodes after showering when her leg gave way, while driving to work she experienced diplopia and triple vision, Rx to experienced a period of time according to her 20 minutes or so of transient amnesia, CT scan of the head was without acute abnormality, negative MRI, normal blood work, TIA/stroke work-up was negative, her symptoms were thought to be secondary to status migrainosus, she was given multiple doses of migraine cocktail during hospitalization, she was discharged on Maxalt and valproic acid.  She feels "spacey", she still has a headache, has had it since she was seen at the ER, she was driving and she says she had triple vision and closed one eye and it resolved, she has had a headache for 2 months and she has had a headache since her friend passed away thanksgiving, she cared for her friend's mother who dies in hospice (she gave her morphine throught the night) she didn't sleep for several days helping out and a few days later had the incident driving and triple vision, she had a headache at the time (had been ongoing for months in the setting of stress), she wakes up with migraine, better standing, waxes and wanes, prior to a few months ago she would get a headache once a month and caffeine and analgesic may help, but she would start with a visual problem, looks like a 70s wavy lines in both eyes, may not progress to a headache if she catches it on time. But this has been different. She lost 3 hours, she was at work, she was told she was appropriate ut doesn't remember 3 hours. Not  doing well on Depakote. She is taking the maxalt 4-5 times worried the headache will come back. Migraine cocktail inpatient helped, she slept the first night int he hospital, she had severe light sensitivity, she had benadryl, compazine, decadron she felt better prior to going home but the headache has returned but not as severe since she came home. maxalt is helping.   Reviewed notes, labs and imaging from outside physicians, which showed:  From a thorough review of records, medications tried that can be used in migraine management includes Tylenol, aspirin, Fioricet, Flexeril, Decadron injections, Benadryl injections, Depakote, ibuprofen, ketorolac injections, magnesium, Depo-Medrol injection, Reglan injections, metoprolol, Zofran tablets and injections, Compazine injections, rizatriptan, Effexor.   B12 373, TSH 2.6, hemoglobin A1c 5.2, LDL 134, CBC normal, BMP unremarkable, HIV negative.   MRI brain 11/13/2020: FINDINGS: Brain: Ventricle size and cerebral volume within normal limits. Several small hyperintensities in the left frontal lobe.  Brainstem and cerebellum normal.   Negative for acute infarct.  Negative for hemorrhage or mass.   Vascular: Normal arterial flow voids.   Skull and upper cervical spine: Negative   Sinuses/Orbits: Paranasal sinuses clear negative orbit   Other: None   IMPRESSION: No acute abnormality. Mild white matter changes most likely due to chronic ischemia.  CTA H&N: IMPRESSION: 11/13/2020 No large vessel occlusion or hemodynamically significant proximal stenosis in the head or neck.  IMPRESSION: This study is within normal limits. No seizures or epileptiform discharges were seen throughout the recording.    Review of Systems: Patient complains of symptoms per HPI as well as the following symptoms headache. Pertinent negatives and positives per HPI. All others negative.   Social History   Socioeconomic History   Marital status: Married    Spouse  name: Not on file   Number of children: Not on file   Years of education: Not on file   Highest education level: Not on file  Occupational History   Not on file  Tobacco Use   Smoking status: Former    Years: 10.00    Pack years: 0.00    Types: Cigarettes   Smokeless tobacco: Never  Vaping Use   Vaping Use: Never used  Substance and Sexual Activity   Alcohol use: Yes    Alcohol/week: 1.0 - 2.0 standard drink    Types: 1 - 2 Standard drinks or equivalent per week    Comment: wine or vodka; was previously 1-2 per night, but liver enzymes up so she has cut back   Drug use: No   Sexual activity: Yes    Partners: Male    Comment: husband vasectomy-1st intercourse 89 yo-5 partners  Other Topics Concern   Not on file  Social History Narrative   Lives at home with husband and 2 children who are freshman in college   Right handed   Caffeine: maybe 1 cup/day   Social Determinants of Radio broadcast assistant Strain: Not on file  Food Insecurity: Not on file  Transportation Needs: Not on file  Physical Activity: Not on file  Stress: Not on file  Social Connections: Not on file  Intimate Partner Violence: Not on file    Family History  Problem Relation Age of Onset   Hypertension Mother    Arthritis Mother    Hypertension Father    Heart disease Father    Lupus Sister    Other Sister        mythensia gravis    Past Medical History:  Diagnosis Date   Anxiety    Arthritis    Back pain    Chronic rhinitis    Complication of anesthesia    Depression    Family history of colonic polyps    Hepatic hemangioma    Hyperlipidemia    IBS (irritable bowel syndrome)    Insomnia    Migraine    Osteoarthritis, foot, localized    left   Perimenopause    Peripheral neuropathy    PONV (postoperative nausea and vomiting)    Raynauds syndrome    Seasonal allergies    Von Willebrand's disease ([place])    bruises easily    Patient Active Problem List   Diagnosis Date Noted    Chronic migraine without aura with status migrainosus, not intractable 01/07/2021   Migraine with aura and with status migrainosus, not intractable 11/20/2020   TIA (transient ischemic attack) 11/13/2020   Sinus tachycardia 12/09/2019   Hypertension  12/09/2019    Past Surgical History:  Procedure Laterality Date   BONE EXOSTOSIS EXCISION Left 02/24/2019   Procedure: Dorsal Exostectomy;  Surgeon: Wylene Simmer, MD;  Location: [place];  Service: Orthopedics;  Laterality: Left;   CESAREAN SECTION     laparoscopy removal of right ovarian cyst     left knee arthroscopy     NOVASURE ABLATION  12/2005   pylonidal cyst removal     SEPTOPLASTY     TOOTH EXTRACTION  12/2020    Current Outpatient Medications  Medication Sig Dispense Refill   Fremanezumab-vfrm ([name]) 225 MG/1.5ML SOAJ Inject 225 mg into the skin every 30 (thirty) days. 1.5 mL 11   gabapentin (NEURONTIN) 300 MG capsule Take 1 capsule (300 mg total) by mouth 3 (three) times daily. 90 capsule 11   almotriptan (AXERT) 12.5 MG tablet TAKE 1 TABLET BY MOUTH IN THE MORNING AND AT BEDTIME. (Patient not taking: Reported on 01/07/2021) 9 tablet 0   amoxicillin (AMOXIL) 500 MG capsule TAKE 1 CAPSULE BY MOUTH 3 TIMES DAILY UNTIL GONE 21 capsule 0   cholecalciferol (VITAMIN D3) 25 MCG (1000 UNIT) tablet Take 1,000 Units by mouth daily.     COVID-19 [facility] Home [facility]igen Test [facility] TEST) KIT Use as directed 4 each 0   Cyanocobalamin (VITAMIN B12) 1000 MCG TBCR Take 1 tablet by mouth daily.     diclofenac Sodium (VOLTAREN) 1 % GEL Apply 2 g topically as needed.     Fremanezumab-vfrm ([name]) 225 MG/1.5ML SOAJ Inject 225 mg into the skin every 30 (thirty) days. 4.5 mL 0   GLUTATHIONE PO Take by mouth at bedtime.     indomethacin (INDOCIN) 50 MG capsule Take 1 capsule (50 mg total) by mouth 3 (three) times daily with meals. For 5 days. 15 capsule 0   ipratropium (ATROVENT) 0.06 % nasal spray Place 2 sprays into  both nostrils as needed for rhinitis.     metoprolol succinate (TOPROL-XL) 25 MG 24 hr tablet TAKE 1 TABLET BY MOUTH ONCE DAILY 90 tablet 3   Omega 3 340 MG CPDR Take 2 capsules by mouth daily.     ondansetron (ZOFRAN) 8 MG tablet TAKE 1 TABLET BY MOUTH DAILY AS NEEDED FOR NAUSEA OR VOMITING 30 tablet 0   penicillin v potassium (VEETID) 500 MG tablet TAKE 1 TABLET BY MOUTH 3 TIMES DAILY 21 tablet 0   Probiotic Product (PROBIOTIC PO) Take 1 capsule by mouth daily.     progesterone (PROMETRIUM) 100 MG capsule Take 1 capsule (100 mg total) by mouth at bedtime. (Patient not taking: Reported on 01/07/2021) 30 capsule 11   progesterone (PROMETRIUM) 100 MG capsule TAKE 1 CAPSULE BY MOUTH ONCE DAILY AT BEDTIME 90 capsule 3   rizatriptan (MAXALT-MLT) 10 MG disintegrating tablet DISSOLVE 1 TABLET BY MOUTH AS NEEDED FOR MIGRAINE. MAY REPEAT IN 2 HOURS IF NEEDED 9 tablet 11   saccharomyces boulardii (FLORASTOR) 250 MG capsule Take by mouth. 2 per day     TRINTELLIX 10 MG TABS tablet Take 10 mg by mouth daily.     UNABLE TO FIND Med Name: CPVII     VIVELLE-DOT 0.1 MG/24HR patch Place 1 patch (0.1 mg total) onto the skin 2 (two) times a week. (Patient not taking: Reported on 01/07/2021) 8 patch 12   vortioxetine HBr (TRINTELLIX) 10 MG TABS tablet TAKE 1 TABLET BY MOUTH ONCE DAILY 90 tablet 3   Zinc 50 MG TABS Take 1 tablet by mouth every Monday, Wednesday,  and Friday.     zolpidem (AMBIEN) 10 MG tablet Take 1 tablet (10 mg total) by mouth at bedtime as needed for sleep. (Patient taking differently: Take 10 mg by mouth at bedtime.) 90 tablet 4   zolpidem (AMBIEN) 10 MG tablet TAKE 1 TABLET BY MOUTH AT BEDTIME 90 tablet 1   No current facility-administered medications for this visit.    Allergies as of 04/04/2021 - Review Complete 01/07/2021  Allergen Reaction Noted   Levaquin [levofloxacin in d5w] Other (See Comments) 07/01/2018   Sulfa antibiotics Other (See Comments)     Vitals: LMP 11/14/2012  Last  Weight:  Wt Readings from Last 1 Encounters:  01/07/21 184 lb (83.5 kg)   Last Height:   Ht Readings from Last 1 Encounters:  01/07/21 $RemoveB'5\' 8"'LeJJNkKI$  (1.727 m)     Assessment/Plan:  Patient with hx of migraine with aura, does not recall 3 hours of time (cannot recall anything that happened) in the setting of extreme emotional distress and headache. Stroke wkup inpatient unrevealing, was attributed to migraine. I suspect Transient Global Amnesia, less likely due to migraine or seizure.  Since this incident she has been having persistent headaches which improved with gabapentin and [name].    - she had a recent strange incident where she received [employer] in the mail, does not remember ordering them, it appears that she ordered them at 3 AM in the morning and does not remember it.  I think this might of been a medication effect possibly gabapentin with Ambien.  - we will continue the ajovy, take benadryl prior for any reactions.  If insurance prefers [place] we can order that instead.  - she is taking $RemoveB'300mg'zLcsUDQZ$  qam and 300qpm helping with the hot flashes but she is gaining weight so she is going to see obgyn to help with hot flashes. She thinks gabapentin helped her headaches unfortunately.   - she wake often in the middle of the night, she is extremely fatigued during the day, we could send to dr dohmeier for sleep evaluation especially given the unusual episode she had in the middle of the night and also asked Dr. Brett Fairy to include an EEG montage during sleep study.   - She is on HRT and she has migraines with aura and an episode of transient memory loss/amnesia. Do not recommend HRT due to stroke risk factors.   -TCD and EEG was negative.  -She found an abscess of tooth, that is now improved, headaches have improved but not completely gone despite trying migraine cocktail (did significantly help in the office), Nurtec, Maxalt, Axert, steroids.  At this time I do recommend she start a preventative  and I gave her some [name] samples, if she is feeling significantly better in 4 months we may be able to stop them and see if she goes back to her prior baseline. -Maxalt acutely.  Prior Assessment and plan - TCD: There is increased prevalence of PFO in migraine with aura. Given possible TGA, if pfo found I would recommend closure - Needs to manage ldl <70, HgbA1c < 6.5 - EEG - EEG normal inpatient. Repeat to increase sensitivity however seizure less likely (she was functional during the hours she can;t remembre and at work) - Daily asa $RemoveB'81mg'IycJxuFk$  - can consider but has von willibran disease, will see what the workup shows - Medrol dosepak and nurtec to try and break the headache however may consider ongoing migraine daily prevention if headache does not resolve. Stop Depakote (side effects)  Orders Placed This Encounter  Procedures   Ambulatory referral to Sleep Studies    Meds ordered this encounter  Medications   Fremanezumab-vfrm ([name]) 225 MG/1.5ML SOAJ    Sig: Inject 225 mg into the skin every 30 (thirty) days.    Dispense:  1.5 mL    Refill:  11     Cc: Maurice Small, MD,  Maurice Small, MD  Sarina Ill, MD  [facility] [address] [place], [place] [address]  Phone [phone] Fax [phone]

## 2021-04-05 ENCOUNTER — Other Ambulatory Visit (HOSPITAL_COMMUNITY): Payer: Self-pay

## 2021-04-09 ENCOUNTER — Other Ambulatory Visit (HOSPITAL_COMMUNITY): Payer: Self-pay

## 2021-04-09 ENCOUNTER — Telehealth: Payer: 59 | Admitting: Physician Assistant

## 2021-04-09 DIAGNOSIS — U071 COVID-19: Secondary | ICD-10-CM

## 2021-04-09 MED ORDER — MOLNUPIRAVIR EUA 200MG CAPSULE
4.0000 | ORAL_CAPSULE | Freq: Two times a day (BID) | ORAL | 0 refills | Status: AC
  Filled 2021-04-09: qty 40, 5d supply, fill #0

## 2021-04-09 NOTE — Progress Notes (Signed)
Virtual Visit Consent   [name], you are scheduled for a virtual visit with a [facility] provider today.     Just as with appointments in the office, your consent must be obtained to participate.  Your consent will be active for this visit and any virtual visit you may have with one of our providers in the next 365 days.     If you have a MyChart account, a copy of this consent can be sent to you electronically.  All virtual visits are billed to your insurance company just like a traditional visit in the office.    As this is a virtual visit, video technology does not allow for your provider to perform a traditional examination.  This may limit your provider's ability to fully assess your condition.  If your provider identifies any concerns that need to be evaluated in person or the need to arrange testing (such as labs, EKG, etc.), we will make arrangements to do so.     Although advances in technology are sophisticated, we cannot ensure that it will always work on either your end or our end.  If the connection with a video visit is poor, the visit may have to be switched to a telephone visit.  With either a video or telephone visit, we are not always able to ensure that we have a secure connection.     I need to obtain your verbal consent now.   Are you willing to proceed with your visit today?    [name] has provided verbal consent on 04/09/2021 for a virtual visit (video or telephone).   Leeanne Rio, [place]   Date: 04/09/2021 9:25 AM   Virtual Visit via Video Note   I, Leeanne Rio, connected [email or web address] ([id number], [id number]) on 04/09/21 at  9:15 AM EDT by a video-enabled telemedicine application and verified that I am speaking with the correct person using two identifiers.  Location: Patient: Virtual Visit Location Patient: Home Provider: Virtual Visit Location Provider: Home Office   I discussed the limitations of evaluation and management by  telemedicine and the availability of in person appointments. The patient expressed understanding and agreed to proceed.    History of Present Illness: [name] is a 60 y.o. who identifies as a female who was assigned female at birth, and is being seen today for + COVID test. Patient endorses recent exposure via friends. Started having symptoms on Sunday morning which have progressively worsened since that time. Initially took a test Sunday evening which was negative. Took last test yesterday evening which came back positive. Patient endorses having significant sore throat with odynophagia. Notes fevers , chills, headache. Denies ear pain, tooth pain, sinus pain. Denies chest pain. Nasal congestion with sinus pressure. Has taken Tylenol and motrin for symptoms. [place]ove have CV history.   HPI: HPI  Problems:  Patient Active Problem List   Diagnosis Date Noted   Chronic migraine without aura with status migrainosus, not intractable 01/07/2021   Migraine with aura and with status migrainosus, not intractable 11/20/2020   TIA (transient ischemic attack) 11/13/2020   Sinus tachycardia 12/09/2019   Hypertension 12/09/2019    Allergies:  Allergies  Allergen Reactions   Levaquin [Levofloxacin In D5w] Other (See Comments)    Tendonitis in shoulder, severe yeast infections   Sulfa Antibiotics Other (See Comments)    Unknown reaction - childhood allergy   Medications:  Current Outpatient Medications:    gabapentin (NEURONTIN) 300 MG capsule, Take  1 capsule (300 mg total) by mouth 3 (three) times daily., Disp: 90 capsule, Rfl: 11   molnupiravir EUA 200 mg CAPS, Take 4 capsules (800 mg total) by mouth 2 (two) times daily for 5 days., Disp: 40 capsule, Rfl: 0   cholecalciferol (VITAMIN D3) 25 MCG (1000 UNIT) tablet, Take 1,000 Units by mouth daily., Disp: , Rfl:    COVID-19 At Home Antigen Test [facility] COVID-19 HOME TEST) KIT, Use as directed, Disp: 4 each, Rfl: 0   Cyanocobalamin (VITAMIN  B12) 1000 MCG TBCR, Take 1 tablet by mouth daily., Disp: , Rfl:    diclofenac Sodium (VOLTAREN) 1 % GEL, Apply 2 g topically as needed., Disp: , Rfl:    Fremanezumab-vfrm ([name]) 225 MG/1.5ML SOAJ, Inject 225 mg into the skin every 30 (thirty) days., Disp: 4.5 mL, Rfl: 0   Fremanezumab-vfrm ([name]) 225 MG/1.5ML SOAJ, Inject 225 mg into the skin every 30 (thirty) days., Disp: 1.5 mL, Rfl: 11   GLUTATHIONE PO, Take by mouth at bedtime., Disp: , Rfl:    ipratropium (ATROVENT) 0.06 % nasal spray, Place 2 sprays into both nostrils as needed for rhinitis., Disp: , Rfl:    metoprolol succinate (TOPROL-XL) 25 MG 24 hr tablet, TAKE 1 TABLET BY MOUTH ONCE DAILY, Disp: 90 tablet, Rfl: 3   Omega 3 340 MG CPDR, Take 2 capsules by mouth daily., Disp: , Rfl:    ondansetron (ZOFRAN) 8 MG tablet, TAKE 1 TABLET BY MOUTH DAILY AS NEEDED FOR NAUSEA OR VOMITING, Disp: 30 tablet, Rfl: 0   Probiotic Product (PROBIOTIC PO), Take 1 capsule by mouth daily., Disp: , Rfl:    rizatriptan (MAXALT-MLT) 10 MG disintegrating tablet, DISSOLVE 1 TABLET BY MOUTH AS NEEDED FOR MIGRAINE. MAY REPEAT IN 2 HOURS IF NEEDED, Disp: 9 tablet, Rfl: 11   saccharomyces boulardii (FLORASTOR) 250 MG capsule, Take by mouth. 2 per day, Disp: , Rfl:    TRINTELLIX 10 MG TABS tablet, Take 10 mg by mouth daily., Disp: , Rfl:    UNABLE TO FIND, Med Name: CPVII, Disp: , Rfl:    vortioxetine HBr (TRINTELLIX) 10 MG TABS tablet, TAKE 1 TABLET BY MOUTH ONCE DAILY, Disp: 90 tablet, Rfl: 3   Zinc 50 MG TABS, Take 1 tablet by mouth every Monday, Wednesday, and Friday., Disp: , Rfl:    zolpidem (AMBIEN) 10 MG tablet, TAKE 1 TABLET BY MOUTH AT BEDTIME, Disp: 90 tablet, Rfl: 1  Observations/Objective: Patient is well-developed, well-nourished in no acute distress.  Resting comfortably at home.  Head is normocephalic, atraumatic.  No labored breathing. Speech is clear and coherent with logical content.  Patient is alert and oriented at baseline.   Assessment  and Plan: 1. COVID-19 - molnupiravir EUA 200 mg CAPS; Take 4 capsules (800 mg total) by mouth 2 (two) times daily for 5 days.  Dispense: 40 capsule; Refill: 0 - MyChart COVID-19 home monitoring program; Future Moderate symptoms, thankfully without any alarm signs/symptoms. Is candidate for antiviral. No recent renal function on file so not candidate for Paxlovid. Discussed pros and cons of molnupiravir including ADRs. She would like to proceed with antiviral therapy. Rx sent to pharmacy. Supportive measures, OTC medications and vitamin regimen reviewed. She has been enrolled in a COVID symptom monitoring program through [facility]. Quarantine reviewed. Strict ER precautions discussed.  Follow Up Instructions: I discussed the assessment and treatment plan with the patient. The patient was provided an opportunity to ask questions and all were answered. The patient agreed with the plan and demonstrated an  understanding of the instructions.  A copy of instructions were sent to the patient via MyChart.  The patient was advised to call back or seek an in-person evaluation if the symptoms worsen or if the condition fails to improve as anticipated.  Time:  I spent 15 minutes with the patient via telehealth technology discussing the above problems/concerns.    Leeanne Rio, PA-C

## 2021-04-09 NOTE — Patient Instructions (Signed)
[name], thank you for joining Leeanne Rio, PA-C for today's virtual visit.  While this provider is not your primary care provider (PCP), if your PCP is located in our provider database this encounter information will be shared with them immediately following your visit.  Consent: (Patient) [name] provided verbal consent for this virtual visit at the beginning of the encounter.  Current Medications:  Current Outpatient Medications:    gabapentin (NEURONTIN) 300 MG capsule, Take 1 capsule (300 mg total) by mouth 3 (three) times daily., Disp: 90 capsule, Rfl: 11   almotriptan (AXERT) 12.5 MG tablet, TAKE 1 TABLET BY MOUTH IN THE MORNING AND AT BEDTIME. (Patient not taking: Reported on 01/07/2021), Disp: 9 tablet, Rfl: 0   amoxicillin (AMOXIL) 500 MG capsule, TAKE 1 CAPSULE BY MOUTH 3 TIMES DAILY UNTIL GONE, Disp: 21 capsule, Rfl: 0   cholecalciferol (VITAMIN D3) 25 MCG (1000 UNIT) tablet, Take 1,000 Units by mouth daily., Disp: , Rfl:    COVID-19 At Home Antigen Test [facility] COVID-19 HOME TEST) KIT, Use as directed, Disp: 4 each, Rfl: 0   Cyanocobalamin (VITAMIN B12) 1000 MCG TBCR, Take 1 tablet by mouth daily., Disp: , Rfl:    diclofenac Sodium (VOLTAREN) 1 % GEL, Apply 2 g topically as needed., Disp: , Rfl:    Fremanezumab-vfrm ([name]) 225 MG/1.5ML SOAJ, Inject 225 mg into the skin every 30 (thirty) days., Disp: 4.5 mL, Rfl: 0   Fremanezumab-vfrm ([name]) 225 MG/1.5ML SOAJ, Inject 225 mg into the skin every 30 (thirty) days., Disp: 1.5 mL, Rfl: 11   GLUTATHIONE PO, Take by mouth at bedtime., Disp: , Rfl:    indomethacin (INDOCIN) 50 MG capsule, Take 1 capsule (50 mg total) by mouth 3 (three) times daily with meals. For 5 days., Disp: 15 capsule, Rfl: 0   ipratropium (ATROVENT) 0.06 % nasal spray, Place 2 sprays into both nostrils as needed for rhinitis., Disp: , Rfl:    metoprolol succinate (TOPROL-XL) 25 MG 24 hr tablet, TAKE 1 TABLET BY MOUTH ONCE DAILY, Disp:  90 tablet, Rfl: 3   Omega 3 340 MG CPDR, Take 2 capsules by mouth daily., Disp: , Rfl:    ondansetron (ZOFRAN) 8 MG tablet, TAKE 1 TABLET BY MOUTH DAILY AS NEEDED FOR NAUSEA OR VOMITING, Disp: 30 tablet, Rfl: 0   penicillin v potassium (VEETID) 500 MG tablet, TAKE 1 TABLET BY MOUTH 3 TIMES DAILY, Disp: 21 tablet, Rfl: 0   Probiotic Product (PROBIOTIC PO), Take 1 capsule by mouth daily., Disp: , Rfl:    progesterone (PROMETRIUM) 100 MG capsule, Take 1 capsule (100 mg total) by mouth at bedtime. (Patient not taking: Reported on 01/07/2021), Disp: 30 capsule, Rfl: 11   progesterone (PROMETRIUM) 100 MG capsule, TAKE 1 CAPSULE BY MOUTH ONCE DAILY AT BEDTIME, Disp: 90 capsule, Rfl: 3   rizatriptan (MAXALT-MLT) 10 MG disintegrating tablet, DISSOLVE 1 TABLET BY MOUTH AS NEEDED FOR MIGRAINE. MAY REPEAT IN 2 HOURS IF NEEDED, Disp: 9 tablet, Rfl: 11   saccharomyces boulardii (FLORASTOR) 250 MG capsule, Take by mouth. 2 per day, Disp: , Rfl:    TRINTELLIX 10 MG TABS tablet, Take 10 mg by mouth daily., Disp: , Rfl:    UNABLE TO FIND, Med Name: CPVII, Disp: , Rfl:    VIVELLE-DOT 0.1 MG/24HR patch, Place 1 patch (0.1 mg total) onto the skin 2 (two) times a week. (Patient not taking: Reported on 01/07/2021), Disp: 8 patch, Rfl: 12   vortioxetine HBr (TRINTELLIX) 10 MG TABS tablet,  TAKE 1 TABLET BY MOUTH ONCE DAILY, Disp: 90 tablet, Rfl: 3   Zinc 50 MG TABS, Take 1 tablet by mouth every Monday, Wednesday, and Friday., Disp: , Rfl:    zolpidem (AMBIEN) 10 MG tablet, Take 1 tablet (10 mg total) by mouth at bedtime as needed for sleep. (Patient taking differently: Take 10 mg by mouth at bedtime.), Disp: 90 tablet, Rfl: 4   zolpidem (AMBIEN) 10 MG tablet, TAKE 1 TABLET BY MOUTH AT BEDTIME, Disp: 90 tablet, Rfl: 1   Medications ordered in this encounter:  No orders of the defined types were placed in this encounter.    *If you need refills on other medications prior to your next appointment, please contact your  pharmacy*  Follow-Up: Call back or seek an in-person evaluation if the symptoms worsen or if the condition fails to improve as anticipated.  Other Instructions Please keep well-hydrated and get plenty of rest. Start a saline nasal rinse to flush out your nasal passages. You can use plain Mucinex to help thin congestion. If you have a humidifier, running in the bedroom at night. I want you to start OTC vitamin D3 1000 units daily, vitamin C 1000 mg daily, and a zinc supplement. Please take prescribed medications as directed, including antiviral medicine.  You have been enrolled in a MyChart symptom monitoring program. Please answer these questions daily so we can keep track of how you are doing.  You were to quarantine for 5 days from onset of your symptoms.  After day 5, if you have had no fever and you are feeling better, you can end quarantine but need to mask for an additional 5 days. After day 5 if you have a fever or are having significant symptoms, please quarantine for full 10 days.  If you note any worsening of symptoms, any significant shortness of breath or any chest pain, please seek ER evaluation ASAP.  Please do not delay care!    If you have been instructed to have an in-person evaluation today at a local Urgent Care facility, please use the link below. It will take you to a list of all of our available [facility], including address, phone number and hours of operation. Please do not delay care.  [facility]  If you or a family member do not have a primary care provider, use the link below to schedule a visit and establish care. When you choose a [facility] primary care physician or advanced practice provider, you gain a long-term partner in health. Find a Primary Care Provider  Learn more about [facility] in-office and virtual care options: [facility]

## 2021-04-11 ENCOUNTER — Telehealth: Payer: Self-pay | Admitting: *Deleted

## 2021-04-11 NOTE — Telephone Encounter (Signed)
[name] Pa, key: [id number], G43.101. tried/failed:  Tylenol, aspirin, Fioricet, Flexeril, Decadron injections, Benadryl injections, Depakote, ibuprofen, ketorolac injections, magnesium, Depo-Medrol injection, Reglan injections, metoprolol, Zofran tablets and injections, Compazine injections, rizatriptan, Effexor. [employer] is reviewing your PA request. If [employer] has not replied within 24 hours for urgent requests or within 48 hours for standard requests, please contact [employer] at [phone].

## 2021-04-16 ENCOUNTER — Other Ambulatory Visit: Payer: Self-pay | Admitting: Neurology

## 2021-04-16 ENCOUNTER — Telehealth: Payer: Self-pay | Admitting: *Deleted

## 2021-04-16 ENCOUNTER — Other Ambulatory Visit (HOSPITAL_COMMUNITY): Payer: Self-pay

## 2021-04-16 ENCOUNTER — Encounter: Payer: Self-pay | Admitting: *Deleted

## 2021-04-16 DIAGNOSIS — G43701 Chronic migraine without aura, not intractable, with status migrainosus: Secondary | ICD-10-CM

## 2021-04-16 MED ORDER — ZOLPIDEM TARTRATE 10 MG PO TABS
10.0000 mg | ORAL_TABLET | Freq: Every day | ORAL | 1 refills | Status: DC
  Filled 2021-04-16: qty 90, 90d supply, fill #0

## 2021-04-16 MED ORDER — EMGALITY 120 MG/ML ~~LOC~~ SOAJ
120.0000 mg | SUBCUTANEOUS | 11 refills | Status: DC
  Filled 2021-04-16: qty 1, 30d supply, fill #0
  Filled 2021-10-09: qty 1, 30d supply, fill #1
  Filled 2021-10-25 – 2021-11-04 (×2): qty 1, 30d supply, fill #2
  Filled 2021-12-12: qty 1, 30d supply, fill #3
  Filled 2022-01-13: qty 1, 30d supply, fill #4

## 2021-04-16 NOTE — Telephone Encounter (Signed)
Per MD called patient and advised insurance didn't approve [name]; would like to switch her to [name], basically the same medication taken to same way.  She stated she is willing to switch, verbalized understanding, appreciation.  Will let Dr Jaynee Eagles know.

## 2021-04-16 NOTE — Telephone Encounter (Signed)
This request has not been approved. . In order for your request to be approved, your provider would need to show that you have met the guideline rules below. . Your provider requested [name] $RemoveBeforeD'225mg'NYsVOkCsSwrnqx$ /1.53mL auto-injectors for the preventative treatment of chronic migraines (you experience 15 or more headache days per month). When used as a preventative treatment of chronic migraines, our guideline named FREMANEZUMAB-VFRM (the generic name for [name] $RemoveB'225mg'PTsqrxxw$ /1.40mL auto-injectors) requires that you have tried TWO of the following preferred agents: Aimovig, Emgality, Nurtec ODT, or Qulipta. Please note that Aimovig, Emgality, Nurtec ODT, and Qulipta also require a prior authorization. Your doctor told us that you have tried Nurtec ODT.

## 2021-04-16 NOTE — Progress Notes (Signed)
emgality

## 2021-04-16 NOTE — Telephone Encounter (Signed)
[employer] approved Emgality. The authorization is effective for a maximum of 1 fill(s) from 04/16/2021 to 05/17/2021. Faxed approval letter to pharmacy.

## 2021-04-16 NOTE — Telephone Encounter (Signed)
Emgality PA< key: [id number]  G43.101. G43.701. Information sent to [employer].

## 2021-04-17 ENCOUNTER — Other Ambulatory Visit (HOSPITAL_COMMUNITY): Payer: Self-pay

## 2021-04-17 MED ORDER — CARESTART COVID-19 HOME TEST VI KIT
PACK | 0 refills | Status: DC
  Filled 2021-04-17: qty 4, 4d supply, fill #0

## 2021-04-24 ENCOUNTER — Telehealth: Payer: Self-pay | Admitting: *Deleted

## 2021-04-24 NOTE — Telephone Encounter (Signed)
-----   Message from Melvenia Beam, MD sent at 04/23/2021  1:06 PM EDT ----- Can we order a neuro[facility]ative 72 hour eeg for patient please? thanks

## 2021-04-24 NOTE — Telephone Encounter (Signed)
Completed referral form for 72 hour EEG and printed all necessary ppw to include with order (insurance, routine EEG, office note, med list). Form ready for Dr Cathren Laine signature.

## 2021-04-25 NOTE — Telephone Encounter (Signed)
Referral signed by Dr Jaynee Eagles and faxed to [facility]. Received a receipt of confirmation.

## 2021-04-29 ENCOUNTER — Other Ambulatory Visit (HOSPITAL_COMMUNITY): Payer: Self-pay

## 2021-04-30 ENCOUNTER — Other Ambulatory Visit (HOSPITAL_COMMUNITY): Payer: Self-pay

## 2021-05-02 ENCOUNTER — Other Ambulatory Visit (HOSPITAL_COMMUNITY): Payer: Self-pay

## 2021-05-06 ENCOUNTER — Other Ambulatory Visit (HOSPITAL_COMMUNITY): Payer: Self-pay

## 2021-05-13 ENCOUNTER — Ambulatory Visit: Payer: 59 | Admitting: Cardiovascular Disease

## 2021-05-15 ENCOUNTER — Other Ambulatory Visit (HOSPITAL_COMMUNITY): Payer: Self-pay

## 2021-05-28 ENCOUNTER — Other Ambulatory Visit (HOSPITAL_COMMUNITY): Payer: Self-pay

## 2021-05-28 DIAGNOSIS — Z01419 Encounter for gynecological examination (general) (routine) without abnormal findings: Secondary | ICD-10-CM | POA: Diagnosis not present

## 2021-05-28 DIAGNOSIS — Z6828 Body mass index (BMI) 28.0-28.9, adult: Secondary | ICD-10-CM | POA: Diagnosis not present

## 2021-05-28 MED ORDER — CLONIDINE 0.1 MG/24HR TD PTWK
MEDICATED_PATCH | TRANSDERMAL | 11 refills | Status: DC
  Filled 2021-05-28: qty 4, 28d supply, fill #0
  Filled 2021-06-11 – 2021-06-14 (×2): qty 4, 28d supply, fill #1
  Filled 2021-07-16: qty 4, 28d supply, fill #2
  Filled 2021-08-12: qty 4, 28d supply, fill #3

## 2021-05-28 MED ORDER — PREMARIN 0.625 MG/GM VA CREA
TOPICAL_CREAM | VAGINAL | 2 refills | Status: DC
  Filled 2021-05-28: qty 30, 67d supply, fill #0
  Filled 2021-09-10: qty 30, 67d supply, fill #1
  Filled 2022-05-28: qty 30, 67d supply, fill #2

## 2021-05-29 ENCOUNTER — Other Ambulatory Visit (HOSPITAL_COMMUNITY): Payer: Self-pay

## 2021-06-03 ENCOUNTER — Other Ambulatory Visit (HOSPITAL_COMMUNITY): Payer: Self-pay

## 2021-06-03 DIAGNOSIS — Z1231 Encounter for screening mammogram for malignant neoplasm of breast: Secondary | ICD-10-CM | POA: Diagnosis not present

## 2021-06-03 MED FILL — Vortioxetine HBr Tab 10 MG (Base Equiv): ORAL | 90 days supply | Qty: 90 | Fill #1 | Status: AC

## 2021-06-04 ENCOUNTER — Other Ambulatory Visit (HOSPITAL_COMMUNITY): Payer: Self-pay

## 2021-06-04 MED ORDER — AMOXICILLIN 500 MG PO CAPS
ORAL_CAPSULE | ORAL | 0 refills | Status: DC
  Filled 2021-06-04: qty 21, 7d supply, fill #0

## 2021-06-04 MED ORDER — CHLORHEXIDINE GLUCONATE 0.12 % MT SOLN
OROMUCOSAL | 0 refills | Status: DC
  Filled 2021-06-04: qty 473, 16d supply, fill #0

## 2021-06-11 ENCOUNTER — Other Ambulatory Visit (HOSPITAL_COMMUNITY): Payer: Self-pay

## 2021-06-11 DIAGNOSIS — Z0000 Encounter for general adult medical examination without abnormal findings: Secondary | ICD-10-CM | POA: Diagnosis not present

## 2021-06-11 DIAGNOSIS — Z79899 Other long term (current) drug therapy: Secondary | ICD-10-CM | POA: Diagnosis not present

## 2021-06-11 DIAGNOSIS — E785 Hyperlipidemia, unspecified: Secondary | ICD-10-CM | POA: Diagnosis not present

## 2021-06-12 ENCOUNTER — Ambulatory Visit (INDEPENDENT_AMBULATORY_CARE_PROVIDER_SITE_OTHER): Payer: 59 | Admitting: Neurology

## 2021-06-12 ENCOUNTER — Other Ambulatory Visit (HOSPITAL_COMMUNITY): Payer: Self-pay

## 2021-06-12 ENCOUNTER — Encounter: Payer: Self-pay | Admitting: Neurology

## 2021-06-12 VITALS — BP 130/80 | HR 67 | Ht 67.0 in | Wt 189.0 lb

## 2021-06-12 DIAGNOSIS — R519 Headache, unspecified: Secondary | ICD-10-CM | POA: Diagnosis not present

## 2021-06-12 DIAGNOSIS — R000 Tachycardia, unspecified: Secondary | ICD-10-CM

## 2021-06-12 DIAGNOSIS — G43101 Migraine with aura, not intractable, with status migrainosus: Secondary | ICD-10-CM

## 2021-06-12 DIAGNOSIS — F5104 Psychophysiologic insomnia: Secondary | ICD-10-CM

## 2021-06-12 MED ORDER — ZOLPIDEM TARTRATE 10 MG PO TABS
10.0000 mg | ORAL_TABLET | Freq: Every day | ORAL | 1 refills | Status: DC
  Filled 2021-06-12 – 2021-07-16 (×2): qty 90, 90d supply, fill #0

## 2021-06-12 NOTE — Patient Instructions (Signed)
Sleep hygiene:   Insomnia Insomnia is a sleep disorder that makes it difficult to fall asleep or stay asleep. Insomnia can cause fatigue, low energy, difficulty concentrating, mood swings, and poor performance at work or school. There are three different ways to classify insomnia: Difficulty falling asleep. Difficulty staying asleep. Waking up too early in the morning. Any type of insomnia can be long-term (chronic) or short-term (acute). Both are common. Short-term insomnia usually lasts for three months or less. Chronic insomnia occurs at least three times a week for longer than three months. What are the causes? Insomnia may be caused by another condition, situation, or substance, such as: Anxiety. Certain medicines. Gastroesophageal reflux disease (GERD) or other gastrointestinal conditions. Asthma or other breathing conditions. Restless legs syndrome, sleep apnea, or other sleep disorders. Chronic pain. Menopause. Stroke. Abuse of alcohol, tobacco, or illegal drugs. Mental health conditions, such as depression. Caffeine. Neurological disorders, such as Alzheimer's disease. An overactive thyroid (hyperthyroidism). Sometimes, the cause of insomnia may not be known. What increases the risk? Risk factors for insomnia include: Gender. Women are affected more often than men. Age. Insomnia is more common as you get older. Stress. Lack of exercise. Irregular work schedule or working night shifts. Traveling between different time zones. Certain medical and mental health conditions. What are the signs or symptoms? If you have insomnia, the main symptom is having trouble falling asleep or having trouble staying asleep. This may lead to other symptoms, such as: Feeling fatigued or having low energy. Feeling nervous about going to sleep. Not feeling rested in the morning. Having trouble concentrating. Feeling irritable, anxious, or depressed. How is this diagnosed? This condition  may be diagnosed based on: Your symptoms and medical history. Your health care provider may ask about: Your sleep habits. Any medical conditions you have. Your mental health. A physical exam. How is this treated? Treatment for insomnia depends on the cause. Treatment may focus on treating an underlying condition that is causing insomnia. Treatment may also include: Medicines to help you sleep. Counseling or therapy. Lifestyle adjustments to help you sleep better. Follow these instructions at home: Eating and drinking  Limit or avoid alcohol, caffeinated beverages, and cigarettes, especially close to bedtime. These can disrupt your sleep. Do not eat a large meal or eat spicy foods right before bedtime. This can lead to digestive discomfort that can make it hard for you to sleep. Sleep habits  Keep a sleep diary to help you and your health care provider figure out what could be causing your insomnia. Write down: When you sleep. When you wake up during the night. How well you sleep. How rested you feel the next day. Any side effects of medicines you are taking. What you eat and drink. Make your bedroom a dark, comfortable place where it is easy to fall asleep. Put up shades or blackout curtains to block light from outside. Use a white noise machine to block noise. Keep the temperature cool. Limit screen use before bedtime. This includes: Watching TV. Using your smartphone, tablet, or computer. Stick to a routine that includes going to bed and waking up at the same times every day and night. This can help you fall asleep faster. Consider making a quiet activity, such as reading, part of your nighttime routine. Try to avoid taking naps during the day so that you sleep better at night. Get out of bed if you are still awake after 15 minutes of trying to sleep. Keep the lights down, but try  reading or doing a quiet activity. When you feel sleepy, go back to bed. General instructions Take  over-the-counter and prescription medicines only as told by your health care provider. Exercise regularly, as told by your health care provider. Avoid exercise starting several hours before bedtime. Use relaxation techniques to manage stress. Ask your health care provider to suggest some techniques that may work well for you. These may include: Breathing exercises. Routines to release muscle tension. Visualizing peaceful scenes. Make sure that you drive carefully. Avoid driving if you feel very sleepy. Keep all follow-up visits as told by your health care provider. This is important. Contact a health care provider if: You are tired throughout the day. You have trouble in your daily routine due to sleepiness. You continue to have sleep problems, or your sleep problems get worse. Get help right away if: You have serious thoughts about hurting yourself or someone else. If you ever feel like you may hurt yourself or others, or have thoughts about taking your own life, get help right away. You can go to your nearest emergency department or call: Your local emergency services (911 in the [place] A suicide crisis helpline, such as the [facility] at 307-511-0515. This is open 24 hours a day. Summary Insomnia is a sleep disorder that makes it difficult to fall asleep or stay asleep. Insomnia can be long-term (chronic) or short-term (acute). Treatment for insomnia depends on the cause. Treatment may focus on treating an underlying condition that is causing insomnia. Keep a sleep diary to help you and your health care provider figure out what could be causing your insomnia. This information is not intended to replace advice given to you by your health care provider. Make sure you discuss any questions you have with your health care provider. Document Revised: 08/09/2020 Document Reviewed: 08/09/2020 Elsevier Patient Education  2022 [employer].

## 2021-06-12 NOTE — Progress Notes (Signed)
SLEEP MEDICINE CLINIC    Provider:  Larey Seat, MD  Primary Care Physician:  Maurice Small, MD [address] [place] [address]     Referring Provider: Melvenia Beam, [place],  [place] [address]          Chief Complaint according to patient   Patient presents with:     New Patient (Initial Visit)           HISTORY OF PRESENT ILLNESS:  [name] is a 60 y.o. year old White or Caucasian female patient seen here as a referral on 06/12/2021 from Dr Jaynee Eagles for a sleep consult. She sees Dr Jaynee Eagles for migraine. Had global amnesia in 11-2020. Trigeminal nerve pain .  Chief concern : Presents today with long history of insomnia. Onset while on Bedrest during her pregnancy when she needed benadryl- , and later , while  in Hospital, she was given Ambien.  She is a Music therapist. She was started on [place] 20 yrs ago and has been on that since.  With the Ambien she cannot sleep for 8 hours but still wakes up frequently at night.  Never had a sleep study. She has ordered stuff on the internet and she found online receipts for orders at 3.30 Am and 4.15 Am while under Ambien. Total amnesia for the events. Emails in the middle of the night, invites to parties sent out.    I have the pleasure of seeing [name] 06-11[birth year]2021, a right -handed White or Caucasian female with a possible sleep disorder.  She  has a past medical history of Anxiety, Arthritis, Back pain, Chronic rhinitis, Complication of anesthesia, Depression, Family history of colonic polyps, Hepatic hemangioma, Hyperlipidemia, IBS (irritable bowel syndrome), chronic Insomnia, Migraine, Osteoarthritis, foot, localized, Perimenopause, Peripheral neuropathy, PONV (postoperative nausea and vomiting), Raynauds syndrome, Seasonal allergies, tachycardia 130', PVC. and Von Willebrand's disease ([place]).    Sleep relevant medical history: Sleep walking(?), dental procedures.   Family  medical /sleep history: sister with OSA, nobody with insomnia, no sleep walkers.    Social history:  Patient is working as NA and lives in a household with husband - college age twins-  premature born- at 52 weeks.  The patient currently works/ used to work in shifts( night/ rotating,), early shift.  Pets are present. 2 dogs, Tobacco use/ quit 34 years ago .  ETOH use 2 glasses at night , Caffeine intake in form of Coffee( 1/2 cup in AM ) Soda( /) Tea ( /) or energy drinks. Regular exercise in form of walking.  Had a foot injury- now pilates.   Sleep habits are as follows: The patient's dinner time is between 6.30 PM. The patient goes to bed at 9.30 PM and continues to sleep for 7 hours, wakes for 1 bathroom break.   The preferred sleep position is sideway , with the support of 1 pillow.  Dreams are reportedly frequent/vivid.  She had a 4 months period of cluster headaches with postural component.  5.15  AM is the usual rise time. The patient wakes up spontaneously.  She reports not feeling refreshed or restored in AM, with symptoms such as  morning headaches, and residual fatigue. Naps are attempted frequently, lasting from 20 to 45 minutes and is trying to sleep.    Review of Systems: Out of a complete 14 system review, the patient complains of only the following symptoms, and all other reviewed systems are  negative.:  Fatigue, sleepiness , snoring, fragmented sleep, Insomnia chronic - AMBIEN dependent    How likely are you to doze in the following situations: 0 = not likely, 1 = slight chance, 2 = moderate chance, 3 = high chance   Sitting and Reading? Watching Television? Sitting inactive in a public place (theater or meeting)? As a passenger in a car for an hour without a break? Lying down in the afternoon when circumstances permit? Sitting and talking to someone? Sitting quietly after lunch without alcohol? In a car, while stopped for a few minutes in traffic?   Total = 7/ 24  points   FSS endorsed at 39/ 63 points.   Social History   Socioeconomic History   Marital status: Married    Spouse name: Not on file   Number of children: Not on file   Years of education: Not on file   Highest education level: Not on file  Occupational History   Not on file  Tobacco Use   Smoking status: Former    Years: 10.00    Types: Cigarettes   Smokeless tobacco: Never  Vaping Use   Vaping Use: Never used  Substance and Sexual Activity   Alcohol use: Yes    Alcohol/week: 1.0 - 2.0 standard drink    Types: 1 - 2 Standard drinks or equivalent per week    Comment: wine or vodka; was previously 1-2 per night, but liver enzymes up so she has cut back   Drug use: No   Sexual activity: Yes    Partners: Male    Comment: husband vasectomy-1st intercourse 81 yo-5 partners  Other Topics Concern   Not on file  Social History Narrative   Lives at home with husband and 2 children who are freshman in college   Right handed   Caffeine: maybe 1 cup/day   Social Determinants of Radio broadcast assistant Strain: Not on file  Food Insecurity: Not on file  Transportation Needs: Not on file  Physical Activity: Not on file  Stress: Not on file  Social Connections: Not on file    Family History  Problem Relation Age of Onset   Hypertension Mother    Arthritis Mother    Hypertension Father    Heart disease Father    Lupus Sister    Other Sister        mythensia gravis    Past Medical History:  Diagnosis Date   Anxiety    Arthritis    Back pain    Chronic rhinitis    Complication of anesthesia    Depression    Family history of colonic polyps    Hepatic hemangioma    Hyperlipidemia    IBS (irritable bowel syndrome)    Insomnia    Migraine    Osteoarthritis, foot, localized    left   Perimenopause    Peripheral neuropathy    PONV (postoperative nausea and vomiting)    Raynauds syndrome    Seasonal allergies    Von Willebrand's disease ([place])    bruises  easily    Past Surgical History:  Procedure Laterality Date   BONE EXOSTOSIS EXCISION Left 02/24/2019   Procedure: Dorsal Exostectomy;  Surgeon: Wylene Simmer, MD;  Location: [facility];  Service: Orthopedics;  Laterality: Left;   CESAREAN SECTION     laparoscopy removal of right ovarian cyst     left knee arthroscopy     NOVASURE ABLATION  12/2005   pylonidal cyst removal  SEPTOPLASTY     TOOTH EXTRACTION  12/2020     Current Outpatient Medications on File Prior to Visit  Medication Sig Dispense Refill   cholecalciferol (VITAMIN D3) 25 MCG (1000 UNIT) tablet Take 1,000 Units by mouth daily.     cloNIDine (CATAPRES - DOSED IN MG/24 HR) 0.1 mg/24hr patch Apply 1 patch every week 4 patch 11   conjugated estrogens (PREMARIN) vaginal cream Insert 0.5 g vaginally nightly for 2 weeks then use 2-3 times a week 30 g 2   COVID-19 At Home Antigen Test (CARESTART COVID-19 HOME TEST) KIT Use as directed 4 each 0   Cyanocobalamin (VITAMIN B12) 1000 MCG TBCR Take 1 tablet by mouth daily.     gabapentin (NEURONTIN) 300 MG capsule Take 1 capsule (300 mg total) by mouth 3 (three) times daily. 90 capsule 11   GLUTATHIONE PO Take by mouth at bedtime.     ipratropium (ATROVENT) 0.06 % nasal spray Place 2 sprays into both nostrils as needed for rhinitis.     metoprolol succinate (TOPROL-XL) 25 MG 24 hr tablet TAKE 1 TABLET BY MOUTH ONCE DAILY 90 tablet 3   Omega 3 340 MG CPDR Take 2 capsules by mouth daily.     Probiotic Product (PROBIOTIC PO) Take 1 capsule by mouth daily.     rizatriptan (MAXALT-MLT) 10 MG disintegrating tablet DISSOLVE 1 TABLET BY MOUTH AS NEEDED FOR MIGRAINE. MAY REPEAT IN 2 HOURS IF NEEDED 9 tablet 11   saccharomyces boulardii (FLORASTOR) 250 MG capsule Take by mouth. 2 per day     UNABLE TO FIND Med Name: CPVII     vortioxetine HBr (TRINTELLIX) 10 MG TABS tablet TAKE 1 TABLET BY MOUTH ONCE DAILY 90 tablet 3   Zinc 50 MG TABS Take 1 tablet by mouth every Monday,  Wednesday, and Friday.     zolpidem (AMBIEN) 10 MG tablet TAKE 1 TABLET BY MOUTH AT BEDTIME 90 tablet 1   No current facility-administered medications on file prior to visit.    Allergies  Allergen Reactions   Levaquin [Levofloxacin In D5w] Other (See Comments)    Tendonitis in shoulder, severe yeast infections   Sulfa Antibiotics Other (See Comments)    Unknown reaction - childhood allergy    Physical exam:  Today's Vitals   06/12/21 1016  BP: 130/80  Pulse: 67  Weight: 189 lb (85.7 kg)  Height: $Remove'5\' 7"'YqEKUVh$  (1.702 m)   Body mass index is 29.6 kg/m.   Wt Readings from Last 3 Encounters:  06/12/21 189 lb (85.7 kg)  01/07/21 184 lb (83.5 kg)  12/12/20 183 lb (83 kg)     Ht Readings from Last 3 Encounters:  06/12/21 $RemoveB'5\' 7"'hjQKpWzF$  (1.702 m)  01/07/21 $RemoveB'5\' 8"'kWXVboTO$  (1.727 m)  12/12/20 $RemoveB'5\' 8"'hXMRRVIk$  (1.727 m)      General: The patient is awake, alert and appears not in acute distress. The patient is well groomed. Head: Normocephalic, atraumatic. Neck is supple. Mallampati 1-2,  neck circumference:15.5 inches . Nasal airflow  patent.  Retrognathia is not seen.  Dental status: had implants.  Cardiovascular:  Regular rate and cardiac rhythm by pulse,  without distended neck veins. Respiratory: Lungs are clear to auscultation.  Skin:  Without evidence of ankle edema, or rash. Trunk: The patient's posture is erect.   Neurologic exam : The patient is awake and alert, oriented to place and time.   Memory subjective described as intact.  Attention span & concentration ability appears normal.  Speech is fluent,  without  dysarthria,  dysphonia or aphasia.  Mood and affect are appropriate.   Cranial nerves: no loss of smell or taste reported  Pupils are equal and briskly reactive to light. Funduscopic exam .  Extraocular movements in vertical and horizontal planes were intact and without nystagmus. No Diplopia. Visual fields by finger perimetry are intact. Hearing was intact to soft voice and finger  rubbing.    Facial sensation intact to fine touch.  Facial motor strength is symmetric and tongue and uvula move midline.  Neck ROM : rotation, tilt and flexion extension were normal for age and shoulder shrug was symmetrical.    Motor exam:  Symmetric bulk, tone and ROM.   Normal tone without cog wheeling, symmetric grip strength .   Sensory:  Fine touch, pinprick and vibration were tested  and  normal.  Proprioception tested in the upper extremities was normal.   Coordination: Rapid alternating movements in the fingers/hands were of normal speed.  The Finger-to-nose maneuver was intact without evidence of ataxia, dysmetria or tremor.   Gait and station: Patient could rise unassisted from a seated position, walked without assistive device.  Stance is of normal width/ base and the patient turned with 3 steps.  Toe and heel walk were deferred.  Deep tendon reflexes: in the  upper and lower extremities are  intact.  Babinski response was deferred.        After spending a total time of  45  minutes face to face and additional time for physical and neurologic examination, review of laboratory studies,  personal review of imaging studies, reports and results of other testing and review of referral information / records as far as provided in visit, I have established the following assessments:  1) Mrs. ADELEINE PASK acquired and insomnia disorder which is now chronic.  When she was pregnant at age 33 with twins and a high risk pregnancy she was forced to be on bedrest, started to use Benadryl at the time as a sleep aid, delivered her babies at 46-week gestational age, and soon after switched to Ambien.   2) whenever she has forgotten to take Ambien or if she had an alcoholic beverage at night and did not want to take Ambien her nights have been rather horrible.  She is not able to get consecutive sustained sleep she feels not refreshed or restored.  Worrisome to her is less the obvious  habituation but that she has perform complex tasks during sleep of which she has no memory.  These amnestic parasomniac activities included writing an elaborate email, or ordering stuff online in the early morning hours only finding out when she receives a good and researched her online history.  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  I like for her to wean off as she also intends.  The nonaddictive medication of choice for any patient approaching their 60th birthday is trazodone dB which she may have not tried yet.  Trazodone is it not anticholinergic and therefore not likely to impair memory, it is nonaddictive, it can be used at various doses without a risk of overdosing 2.  So my goal is to start with trazodone at 50 mg tablets she can take first 25 mg around the time when she is 2 hours post dinner and then a second dose at the time she is going to bed my goal is to at least allow 4 hours of sleep and then we can build up  from there.  Parallel I would like for her to have Ambien at home for the absolute emergency so the night where she absolutely needs to sleep and is otherwise too worried to function the next day.  I would like for the reduction of the Ambien dose however.  3) she can continue gabapentin and clonidin patch    My Plan is to proceed with:  1) HST,  2) trazodone 50 mg tabs , 1-2 at night po.  3) Ambien reduction and prn use only.   I would like to thank Maurice Small, MD and Melvenia Beam, [address] [place],  [place] [address] for allowing me to meet with and to take care of this pleasant patient.   In short, ANALISA SLEDD is presenting with chronic insomnia.,I plan to follow up either personally or through our NP within 2-3 month.   CC: I will share my notes with referring MD.  Electronically signed by: Larey Seat, MD 06/12/2021 10:42 AM  [facility] and [facility]  certified by The [employer] of Sleep Medicine and Diplomate of the [employer] of Sleep Medicine. Board certified In Neurology through the [place], Fellow of the [employer] of Neurology. Medical Director of [employer].

## 2021-06-13 ENCOUNTER — Encounter: Payer: Self-pay | Admitting: Neurology

## 2021-06-13 ENCOUNTER — Other Ambulatory Visit: Payer: Self-pay | Admitting: *Deleted

## 2021-06-13 ENCOUNTER — Other Ambulatory Visit (HOSPITAL_COMMUNITY): Payer: Self-pay

## 2021-06-13 MED ORDER — TRAZODONE HCL 50 MG PO TABS
50.0000 mg | ORAL_TABLET | Freq: Every day | ORAL | 5 refills | Status: DC
  Filled 2021-06-13: qty 60, 30d supply, fill #0
  Filled 2021-07-16: qty 60, 30d supply, fill #1
  Filled 2021-08-22: qty 60, 30d supply, fill #2
  Filled 2021-09-19: qty 60, 30d supply, fill #3
  Filled 2021-10-24: qty 60, 30d supply, fill #4

## 2021-06-13 MED FILL — Rizatriptan Benzoate Oral Disintegrating Tab 10 MG (Base Eq): ORAL | 30 days supply | Qty: 9 | Fill #1 | Status: AC

## 2021-06-14 ENCOUNTER — Other Ambulatory Visit (HOSPITAL_COMMUNITY): Payer: Self-pay

## 2021-06-14 DIAGNOSIS — R922 Inconclusive mammogram: Secondary | ICD-10-CM | POA: Diagnosis not present

## 2021-06-14 DIAGNOSIS — R928 Other abnormal and inconclusive findings on diagnostic imaging of breast: Secondary | ICD-10-CM | POA: Diagnosis not present

## 2021-06-18 ENCOUNTER — Other Ambulatory Visit: Payer: Self-pay | Admitting: Family Medicine

## 2021-06-18 ENCOUNTER — Other Ambulatory Visit (HOSPITAL_COMMUNITY): Payer: Self-pay

## 2021-06-18 DIAGNOSIS — R7989 Other specified abnormal findings of blood chemistry: Secondary | ICD-10-CM

## 2021-06-19 ENCOUNTER — Other Ambulatory Visit (HOSPITAL_COMMUNITY): Payer: Self-pay

## 2021-06-19 ENCOUNTER — Telehealth: Payer: Self-pay | Admitting: Neurology

## 2021-06-19 NOTE — Telephone Encounter (Signed)
LVM for pt to call me back to schedule sleep study  

## 2021-07-08 ENCOUNTER — Ambulatory Visit
Admission: RE | Admit: 2021-07-08 | Discharge: 2021-07-08 | Disposition: A | Discharge status: Home / Self Care | Payer: 59 | Source: Ambulatory Visit | Attending: Family Medicine | Admitting: Family Medicine

## 2021-07-08 DIAGNOSIS — R7989 Other specified abnormal findings of blood chemistry: Secondary | ICD-10-CM | POA: Diagnosis not present

## 2021-07-10 DIAGNOSIS — H11431 Conjunctival hyperemia, right eye: Secondary | ICD-10-CM | POA: Diagnosis not present

## 2021-07-15 ENCOUNTER — Ambulatory Visit (INDEPENDENT_AMBULATORY_CARE_PROVIDER_SITE_OTHER): Payer: 59 | Admitting: Neurology

## 2021-07-15 DIAGNOSIS — R519 Headache, unspecified: Secondary | ICD-10-CM

## 2021-07-15 DIAGNOSIS — G43101 Migraine with aura, not intractable, with status migrainosus: Secondary | ICD-10-CM

## 2021-07-15 DIAGNOSIS — R945 Abnormal results of liver function studies: Secondary | ICD-10-CM | POA: Diagnosis not present

## 2021-07-15 DIAGNOSIS — R000 Tachycardia, unspecified: Secondary | ICD-10-CM

## 2021-07-15 DIAGNOSIS — F5104 Psychophysiologic insomnia: Secondary | ICD-10-CM

## 2021-07-15 DIAGNOSIS — G4733 Obstructive sleep apnea (adult) (pediatric): Secondary | ICD-10-CM

## 2021-07-16 ENCOUNTER — Other Ambulatory Visit (HOSPITAL_COMMUNITY): Payer: Self-pay

## 2021-07-17 ENCOUNTER — Other Ambulatory Visit (HOSPITAL_COMMUNITY): Payer: Self-pay

## 2021-07-18 ENCOUNTER — Telehealth: Payer: Self-pay | Admitting: Gastroenterology

## 2021-07-18 NOTE — Telephone Encounter (Signed)
Dr. Ardis Hughs,    This patient would like to transfer her care from [facility] to [facility].  According to the records her last colonoscopy was in 2017. This pt is requesting you as her provider. I will send you her records for review and advise on scheduling. Thank you.

## 2021-07-19 NOTE — Telephone Encounter (Signed)
LVM for to call back to give Dr. Ardis Hughs recommendations. Pt has been put in for a Colon recall for 11/2025. If pt has any GI issues per Dr. Dr. Ardis Hughs please make next available with him. Thank you.

## 2021-07-25 NOTE — Progress Notes (Signed)
[facility] TEST REPORT ( by Watch PAT)   STUDY DATE: data loaded 07-25-2021  DOB:  January 17, 1961    ORDERING CLINICIAN: Larey Seat, MD  REFERRING CLINICIAN: Dr Justin Mend. Dr. Jaynee Eagles   CLINICAL INFORMATION/HISTORY: [name] is a 60  year old Caucasian female patient seen on 06/12/2021 from Dr Jaynee Eagles for a sleep consult. She sees Dr Jaynee Eagles for migraine.  Had global amnesia in 11-2020. Trigeminal nerve pain. Presents today with long history of insomnia. Onset while on Bedrest during her pregnancy when she needed benadryl- , and later , while  in Hospital, she was given Ambien. 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  With the Ambien she cannot sleep for 8 hours anymore, she still wakes up frequently at night.  Never had a sleep study. She has under the influence of Ambien ordered items on the internet and she found online receipts for orders at 3.30 Am and 4.15 Am while under Ambien. Total amnesia for the events. Emails in the middle of the night, one time she did sent invitations to a party.   Epworth sleepiness score: 7/24. FSS at 39/63 points.    BMI:29.8 kg/m   Neck Circumference: 16 point"   FINDINGS:   Sleep Summary:   Total Recording Time (hours, min): Total recording time amounted to 9 hours and 27 minutes of which 8 hours and 17 minutes were considered the total sleep time with 28% REM sleep time     Percent REM (%):   28%                                     Respiratory Indices:   Calculated pAHI (per hour):   Calculated apnea-hypopnea index for the study was 22.3/h with a REM AHI of 34.1/h non-REM AHI of 17.7/h.    We did not gain any snoring data or positional data in the sleep study.                                                                   Oxygen Saturation Statistics:   Oxygen Saturation (%) Mean:   Oxygen saturation varied between a nadir at 85%, and maximum at 98%, and mean  oxygenation of 93%.                              O2 Saturation (minutes) <89%: Total time in hypoxia was 1.7 minutes the equivalent of 0.3% of total sleep time.         Pulse Rate Statistics:       Pulse Range: Heart rate varied between 57 bpm and a maximum of 95 bpm with a mean heart rate of 67 bpm.  Please note that a home sleep test cannot detect cardiac arrhythmia.               IMPRESSION:  This HST confirms the presence of moderate obstructive sleep apnea, which was much more present in rem sleep than non-REM sleep.  This is an otherwise uncomplicated form of apnea  which should be treated with positive airway pressure therapy.  We did not find hypoxia to be present.   RECOMMENDATION: This patient has chronic insomnia and also presents with moderate obstructive sleep apnea , I recommend to start on positive airway pressure therapy: I will order an auto titration device with a pressure setting between 6 and 16 cmH2O pressure, 3 cm EPR, heated humidification and interface of the patient's choice. I also recommend to replace the Ambien with trazodone or Belsomra.  It would be much less likely to have amnestic parasomnia activity on these medications.    INTERPRETING PHYSICIAN:   Larey Seat, MD   Medical Director of [facility] at [facility].

## 2021-07-29 DIAGNOSIS — G4733 Obstructive sleep apnea (adult) (pediatric): Secondary | ICD-10-CM | POA: Insufficient documentation

## 2021-07-29 DIAGNOSIS — R519 Headache, unspecified: Secondary | ICD-10-CM | POA: Insufficient documentation

## 2021-07-29 DIAGNOSIS — F5104 Psychophysiologic insomnia: Secondary | ICD-10-CM | POA: Insufficient documentation

## 2021-07-29 NOTE — Addendum Note (Signed)
Addended by: Larey Seat on: 07/29/2021 07:29 PM   Modules accepted: Orders

## 2021-07-29 NOTE — Progress Notes (Signed)
IMPRESSION:  This HST confirms the presence of moderate obstructive sleep apnea, which was much more present during REM sleep than non-REM sleep.   This is an otherwise uncomplicated form of apnea which should be treated with positive airway pressure therapy.  We did not find hypoxia to be present.  RECOMMENDATION: This patient has chronic insomnia and also presents with moderate obstructive sleep apnea , I recommend to start on positive airway pressure therapy: I will order an auto titration device with a pressure setting between 6 and 16 cmH2O pressure, 3 cm EPR, heated humidification and interface of the patient's choice. I also recommend to replace the Ambien with trazodone or Belsomra.  It would be much less likely to have amnestic parasomnia activity on these medications.   INTERPRETING PHYSICIAN:   Larey Seat, MD   Medical Director of [facility] Sleep at [facility].

## 2021-07-29 NOTE — Procedures (Signed)
[facility] TEST REPORT ( by Watch PAT)   STUDY DATE: data loaded 07-25-2021  DOB:  21-Oct-1960    ORDERING CLINICIAN: Larey Seat, MD  REFERRING CLINICIAN: Dr Justin Mend. Dr. Jaynee Eagles   CLINICAL INFORMATION/HISTORY: [name] is a 60  year old Caucasian female patient seen on 06/12/2021 from Dr Jaynee Eagles for a sleep consult. She sees Dr Jaynee Eagles for migraine.  Had global amnesia in 11-2020. Trigeminal nerve pain. Presents today with long history of insomnia. Onset while on Bedrest during her pregnancy when she needed benadryl- , and later , while  in Hospital, she was given Ambien. 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  With the Ambien she cannot sleep for 8 hours anymore, she still wakes up frequently at night.  Never had a sleep study. She has under the influence of Ambien ordered items on the internet and she found online receipts for orders at 3.30 Am and 4.15 Am while under Ambien. Total amnesia for the events. Emails in the middle of the night, one time she did sent invitations to a party.   Epworth sleepiness score: 7/24. FSS at 39/63 points.    BMI:29.8 kg/m   Neck Circumference: 16 point"   FINDINGS:   Sleep Summary:   Total Recording Time (hours, min): Total recording time amounted to 9 hours and 27 minutes of which 8 hours and 17 minutes were considered the total sleep time with 28% REM sleep time     Percent REM (%):   28%                                     Respiratory Indices:   Calculated pAHI (per hour):   Calculated apnea-hypopnea index for the study was 22.3/h with a REM AHI of 34.1/h non-REM AHI of 17.7/h.    We did not gain any snoring data or positional data in the sleep study.                                                                   Oxygen Saturation Statistics:   Oxygen Saturation (%) Mean:   Oxygen saturation varied between a nadir at 85%, and maximum at 98%, and mean  oxygenation of 93%.                              O2 Saturation (minutes) <89%: Total time in hypoxia was 1.7 minutes the equivalent of 0.3% of total sleep time.         Pulse Rate Statistics:       Pulse Range: Heart rate varied between 57 bpm and a maximum of 95 bpm with a mean heart rate of 67 bpm.  Please note that a home sleep test cannot detect cardiac arrhythmia.               IMPRESSION:  This HST confirms the presence of moderate obstructive sleep apnea, which was much more present in rem sleep than non-REM sleep.  This is an otherwise uncomplicated form of apnea which should be treated  with positive airway pressure therapy.  We did not find hypoxia to be present.   RECOMMENDATION: This patient has chronic insomnia and also presents with moderate obstructive sleep apnea , I recommend to start on positive airway pressure therapy: I will order an auto titration device with a pressure setting between 6 and 16 cmH2O pressure, 3 cm EPR, heated humidification and interface of the patient's choice. I also recommend to replace the Ambien with trazodone or Belsomra.  It would be much less likely to have amnestic parasomnia activity on these medications.    INTERPRETING PHYSICIAN:   Larey Seat, MD   Medical Director of [facility] at [facility].

## 2021-07-30 ENCOUNTER — Encounter: Payer: Self-pay | Admitting: *Deleted

## 2021-07-30 ENCOUNTER — Telehealth: Payer: Self-pay | Admitting: *Deleted

## 2021-07-30 NOTE — Telephone Encounter (Signed)
-----   Message from Larey Seat, MD sent at 07/29/2021  7:29 PM EDT ----- IMPRESSION:  This HST confirms the presence of moderate obstructive sleep apnea, which was much more present during REM sleep than non-REM sleep.   This is an otherwise uncomplicated form of apnea which should be treated with positive airway pressure therapy.  We did not find hypoxia to be present.  RECOMMENDATION: This patient has chronic insomnia and also presents with moderate obstructive sleep apnea , I recommend to start on positive airway pressure therapy: I will order an auto titration device with a pressure setting between 6 and 16 cmH2O pressure, 3 cm EPR, heated humidification and interface of the patient's choice. I also recommend to replace the Ambien with trazodone or Belsomra.  It would be much less likely to have amnestic parasomnia activity on these medications.   INTERPRETING PHYSICIAN:   Larey Seat, MD   Medical Director of [facility] Sleep at [facility].

## 2021-07-30 NOTE — Telephone Encounter (Signed)
I called [name]. I advised [name] that Dr. Brett Fairy reviewed their sleep study results and found that [name] has sleep apnea. Dr. Brett Fairy recommends that [name] start on cpap. I reviewed PAP compliance expectations with the [name]. [name] is agreeable to starting a CPAP. I advised [name] that an order will be sent to a DME, [facility], and [facility] will call the [name] within about one week after they file with the [name]'s insurance. [facility] will show the [name] how to use the machine, fit for masks, and troubleshoot the CPAP if needed. A follow up appt was made for insurance purposes with Dr. Brett Fairy on 11/04/21 at 2:30pm. [name] verbalized understanding to arrive 15 minutes early and bring their CPAP. A letter with all of this information in it will be mailed to the [name] as a reminder. I verified with the [name] that the address we have on file is correct. [name] verbalized understanding of results. [name] had no questions at this time but was encouraged to call back if questions arise. I have sent the order to [facility] and have received confirmation that they have received the order.  She also stopped [place] about 3-4 wks ago, now taking trazodone and feels great. She will continue this.

## 2021-08-12 ENCOUNTER — Other Ambulatory Visit (HOSPITAL_COMMUNITY): Payer: Self-pay

## 2021-08-14 ENCOUNTER — Other Ambulatory Visit (HOSPITAL_COMMUNITY): Payer: Self-pay

## 2021-08-20 ENCOUNTER — Other Ambulatory Visit (HOSPITAL_COMMUNITY): Payer: Self-pay

## 2021-08-20 DIAGNOSIS — B379 Candidiasis, unspecified: Secondary | ICD-10-CM | POA: Diagnosis not present

## 2021-08-20 DIAGNOSIS — G4733 Obstructive sleep apnea (adult) (pediatric): Secondary | ICD-10-CM | POA: Diagnosis not present

## 2021-08-20 DIAGNOSIS — I493 Ventricular premature depolarization: Secondary | ICD-10-CM | POA: Diagnosis not present

## 2021-08-20 DIAGNOSIS — G4700 Insomnia, unspecified: Secondary | ICD-10-CM | POA: Diagnosis not present

## 2021-08-20 DIAGNOSIS — R748 Abnormal levels of other serum enzymes: Secondary | ICD-10-CM | POA: Diagnosis not present

## 2021-08-20 DIAGNOSIS — F3341 Major depressive disorder, recurrent, in partial remission: Secondary | ICD-10-CM | POA: Diagnosis not present

## 2021-08-20 DIAGNOSIS — Z780 Asymptomatic menopausal state: Secondary | ICD-10-CM | POA: Diagnosis not present

## 2021-08-20 DIAGNOSIS — G43009 Migraine without aura, not intractable, without status migrainosus: Secondary | ICD-10-CM | POA: Diagnosis not present

## 2021-08-20 MED ORDER — NYSTATIN 100000 UNIT/GM EX CREA
TOPICAL_CREAM | CUTANEOUS | 1 refills | Status: DC
  Filled 2021-08-20: qty 15, 30d supply, fill #0

## 2021-08-20 MED ORDER — NYSTATIN 100000 UNIT/GM EX CREA
TOPICAL_CREAM | CUTANEOUS | 11 refills | Status: DC
  Filled 2021-08-20: qty 60, 30d supply, fill #0

## 2021-08-21 ENCOUNTER — Other Ambulatory Visit (HOSPITAL_COMMUNITY): Payer: Self-pay

## 2021-08-22 ENCOUNTER — Other Ambulatory Visit (HOSPITAL_COMMUNITY): Payer: Self-pay

## 2021-08-23 ENCOUNTER — Other Ambulatory Visit (HOSPITAL_COMMUNITY): Payer: Self-pay

## 2021-08-23 MED ORDER — TRINTELLIX 10 MG PO TABS
ORAL_TABLET | ORAL | 0 refills | Status: DC
  Filled 2021-08-23: qty 90, 90d supply, fill #0

## 2021-08-27 ENCOUNTER — Other Ambulatory Visit (HOSPITAL_COMMUNITY): Payer: Self-pay

## 2021-08-27 DIAGNOSIS — D1801 Hemangioma of skin and subcutaneous tissue: Secondary | ICD-10-CM | POA: Diagnosis not present

## 2021-08-27 DIAGNOSIS — N959 Unspecified menopausal and perimenopausal disorder: Secondary | ICD-10-CM | POA: Diagnosis not present

## 2021-08-27 DIAGNOSIS — L570 Actinic keratosis: Secondary | ICD-10-CM | POA: Diagnosis not present

## 2021-08-27 MED ORDER — CLONIDINE 0.2 MG/24HR TD PTWK
MEDICATED_PATCH | TRANSDERMAL | 2 refills | Status: DC
  Filled 2021-08-27: qty 4, 28d supply, fill #0

## 2021-08-28 ENCOUNTER — Other Ambulatory Visit (HOSPITAL_COMMUNITY): Payer: Self-pay

## 2021-09-10 ENCOUNTER — Other Ambulatory Visit (HOSPITAL_COMMUNITY): Payer: Self-pay

## 2021-09-10 MED ORDER — CLONIDINE HCL 0.2 MG PO TABS
ORAL_TABLET | ORAL | 11 refills | Status: DC
  Filled 2021-09-10: qty 30, 30d supply, fill #0
  Filled 2021-10-04: qty 30, 30d supply, fill #1

## 2021-09-10 MED FILL — Rizatriptan Benzoate Oral Disintegrating Tab 10 MG (Base Eq): ORAL | 30 days supply | Qty: 9 | Fill #2 | Status: AC

## 2021-09-19 ENCOUNTER — Encounter: Payer: Self-pay | Admitting: Neurology

## 2021-09-19 ENCOUNTER — Other Ambulatory Visit (HOSPITAL_COMMUNITY): Payer: Self-pay

## 2021-09-19 ENCOUNTER — Other Ambulatory Visit: Payer: Self-pay | Admitting: Neurology

## 2021-09-19 DIAGNOSIS — M79672 Pain in left foot: Secondary | ICD-10-CM | POA: Diagnosis not present

## 2021-09-19 DIAGNOSIS — M19072 Primary osteoarthritis, left ankle and foot: Secondary | ICD-10-CM | POA: Diagnosis not present

## 2021-09-19 MED ORDER — NARATRIPTAN HCL 2.5 MG PO TABS
2.5000 mg | ORAL_TABLET | ORAL | 0 refills | Status: DC | PRN
  Filled 2021-09-19: qty 10, 30d supply, fill #0

## 2021-09-19 MED ORDER — PREDNISONE 10 MG (21) PO TBPK
ORAL_TABLET | ORAL | 0 refills | Status: DC
  Filled 2021-09-19: qty 21, 6d supply, fill #0

## 2021-09-20 DIAGNOSIS — G4733 Obstructive sleep apnea (adult) (pediatric): Secondary | ICD-10-CM | POA: Diagnosis not present

## 2021-10-04 ENCOUNTER — Other Ambulatory Visit (HOSPITAL_COMMUNITY): Payer: Self-pay

## 2021-10-09 ENCOUNTER — Ambulatory Visit: Payer: Self-pay | Attending: Internal Medicine

## 2021-10-09 ENCOUNTER — Other Ambulatory Visit: Payer: Self-pay | Admitting: Neurology

## 2021-10-09 ENCOUNTER — Other Ambulatory Visit (HOSPITAL_COMMUNITY): Payer: Self-pay

## 2021-10-09 DIAGNOSIS — Z23 Encounter for immunization: Secondary | ICD-10-CM

## 2021-10-09 MED ORDER — NARATRIPTAN HCL 2.5 MG PO TABS
2.5000 mg | ORAL_TABLET | ORAL | 11 refills | Status: DC | PRN
  Filled 2021-10-09: qty 9, 30d supply, fill #0
  Filled 2021-10-24: qty 9, 27d supply, fill #0
  Filled 2021-10-25: qty 9, 27d supply, fill #1
  Filled 2022-05-28: qty 9, 27d supply, fill #2
  Filled 2022-07-18: qty 9, 27d supply, fill #3
  Filled 2022-08-23: qty 9, 27d supply, fill #4

## 2021-10-09 NOTE — Progress Notes (Signed)
° °  Covid-19 Vaccination Clinic  Name:  [name]    MRN: [id number] DOB: 06/27/1961  10/09/2021  [name] was observed post Covid-19 immunization for 15 minutes without incident. She was provided with Vaccine Information Sheet and instruction to access the V-Safe system.   [name] was instructed to call 911 with any severe reactions post vaccine: Difficulty breathing  Swelling of face and throat  A fast heartbeat  A bad rash all over body  Dizziness and weakness   Immunizations Administered     Name Date Dose VIS Date Route   Pfizer Covid-19 Vaccine Bivalent Booster 10/09/2021 10:22 AM 0.3 mL 06/12/2021 Intramuscular   Manufacturer: [place]   Lot: [id number]   [place]: [phone]

## 2021-10-10 ENCOUNTER — Other Ambulatory Visit (HOSPITAL_BASED_OUTPATIENT_CLINIC_OR_DEPARTMENT_OTHER): Payer: Self-pay

## 2021-10-10 MED ORDER — PFIZER COVID-19 VAC BIVALENT 30 MCG/0.3ML IM SUSP
INTRAMUSCULAR | 0 refills | Status: DC
  Filled 2021-10-10: qty 0.3, 1d supply, fill #0

## 2021-10-11 ENCOUNTER — Other Ambulatory Visit (HOSPITAL_COMMUNITY): Payer: Self-pay

## 2021-10-11 MED ORDER — CARESTART COVID-19 HOME TEST VI KIT
PACK | 0 refills | Status: DC
  Filled 2021-10-11: qty 4, 4d supply, fill #0

## 2021-10-14 ENCOUNTER — Other Ambulatory Visit (HOSPITAL_COMMUNITY): Payer: Self-pay

## 2021-10-21 DIAGNOSIS — G4733 Obstructive sleep apnea (adult) (pediatric): Secondary | ICD-10-CM | POA: Diagnosis not present

## 2021-10-23 ENCOUNTER — Other Ambulatory Visit (HOSPITAL_COMMUNITY): Payer: Self-pay

## 2021-10-24 ENCOUNTER — Other Ambulatory Visit (HOSPITAL_COMMUNITY): Payer: Self-pay

## 2021-10-25 ENCOUNTER — Other Ambulatory Visit (HOSPITAL_COMMUNITY): Payer: Self-pay

## 2021-10-26 ENCOUNTER — Other Ambulatory Visit (HOSPITAL_COMMUNITY): Payer: Self-pay

## 2021-10-28 ENCOUNTER — Other Ambulatory Visit (HOSPITAL_COMMUNITY): Payer: Self-pay

## 2021-10-28 MED ORDER — TRINTELLIX 10 MG PO TABS
10.0000 mg | ORAL_TABLET | Freq: Every day | ORAL | 1 refills | Status: DC
  Filled 2021-10-28: qty 90, 90d supply, fill #0
  Filled 2021-11-05: qty 30, 30d supply, fill #0
  Filled 2021-11-06 – 2021-11-09 (×2): qty 90, 90d supply, fill #0
  Filled 2022-03-01: qty 90, 90d supply, fill #1

## 2021-10-31 ENCOUNTER — Other Ambulatory Visit (HOSPITAL_COMMUNITY): Payer: Self-pay

## 2021-11-02 ENCOUNTER — Other Ambulatory Visit (HOSPITAL_COMMUNITY): Payer: Self-pay

## 2021-11-04 ENCOUNTER — Telehealth: Payer: Self-pay | Admitting: Neurology

## 2021-11-04 ENCOUNTER — Other Ambulatory Visit (HOSPITAL_COMMUNITY): Payer: Self-pay

## 2021-11-04 ENCOUNTER — Telehealth: Payer: Self-pay | Admitting: *Deleted

## 2021-11-04 ENCOUNTER — Ambulatory Visit: Payer: 59 | Admitting: Neurology

## 2021-11-04 NOTE — Telephone Encounter (Signed)
Pt request refill for Galcanezumab-gnlm [facility]) 120 MG/ML SOAJ at [facility]

## 2021-11-04 NOTE — Telephone Encounter (Signed)
Pt may need PA.

## 2021-11-04 NOTE — Telephone Encounter (Addendum)
The request has been approved. The authorization is effective for a maximum of 12 fills from 11/04/2021 to 11/03/2022, as long as the member is enrolled in their current health plan. The request was approved with a quantity restriction. This has been approved for a quantity limit of 1 with a day supply limit of 30. A written notification letter will follow with additional details.  Will notify patient through mychart  about approval

## 2021-11-04 NOTE — Telephone Encounter (Signed)
[name] (Key: [id number]) Emgality 120MG /ML auto-injectors (migraine)  PA sent this afternoon waiting on approval

## 2021-11-05 ENCOUNTER — Other Ambulatory Visit (HOSPITAL_COMMUNITY): Payer: Self-pay

## 2021-11-06 ENCOUNTER — Other Ambulatory Visit (HOSPITAL_COMMUNITY): Payer: Self-pay

## 2021-11-09 ENCOUNTER — Other Ambulatory Visit (HOSPITAL_COMMUNITY): Payer: Self-pay

## 2021-11-21 DIAGNOSIS — G4733 Obstructive sleep apnea (adult) (pediatric): Secondary | ICD-10-CM | POA: Diagnosis not present

## 2021-12-05 ENCOUNTER — Other Ambulatory Visit (HOSPITAL_COMMUNITY): Payer: Self-pay

## 2021-12-10 ENCOUNTER — Encounter: Payer: Self-pay | Admitting: Neurology

## 2021-12-12 ENCOUNTER — Other Ambulatory Visit: Payer: Self-pay

## 2021-12-12 ENCOUNTER — Encounter: Payer: Self-pay | Admitting: Neurology

## 2021-12-12 ENCOUNTER — Ambulatory Visit (INDEPENDENT_AMBULATORY_CARE_PROVIDER_SITE_OTHER): Payer: 59 | Admitting: Neurology

## 2021-12-12 ENCOUNTER — Other Ambulatory Visit (HOSPITAL_COMMUNITY): Payer: Self-pay

## 2021-12-12 VITALS — BP 121/82 | HR 72 | Ht 67.0 in | Wt 201.0 lb

## 2021-12-12 DIAGNOSIS — N951 Menopausal and female climacteric states: Secondary | ICD-10-CM | POA: Diagnosis not present

## 2021-12-12 DIAGNOSIS — Z9989 Dependence on other enabling machines and devices: Secondary | ICD-10-CM | POA: Diagnosis not present

## 2021-12-12 DIAGNOSIS — F5104 Psychophysiologic insomnia: Secondary | ICD-10-CM

## 2021-12-12 DIAGNOSIS — G4733 Obstructive sleep apnea (adult) (pediatric): Secondary | ICD-10-CM

## 2021-12-12 MED ORDER — TRAZODONE HCL 50 MG PO TABS
50.0000 mg | ORAL_TABLET | Freq: Every day | ORAL | 5 refills | Status: DC
  Filled 2021-12-12: qty 60, 30d supply, fill #0
  Filled 2022-01-18: qty 60, 30d supply, fill #1
  Filled 2022-03-01: qty 60, 30d supply, fill #2
  Filled 2022-03-28: qty 60, 30d supply, fill #3
  Filled 2022-04-28: qty 60, 30d supply, fill #4
  Filled 2022-05-28: qty 60, 30d supply, fill #5

## 2021-12-12 NOTE — Patient Instructions (Signed)

## 2021-12-12 NOTE — Progress Notes (Signed)
SLEEP MEDICINE CLINIC    Provider:  Larey Seat, MD  Primary Care Physician:  Maurice Small, MD [place] [address] [address]     Referring Provider: Dr Percell Belt          Chief Complaint according to patient   Patient presents with:     New Patient (Initial Visit)           HISTORY OF PRESENT ILLNESS:  [name] is a 61 y.o. Caucasian female patient who was seen here as a referral from Dr Jaynee Eagles for a sleep consult. This is her first RV on 12/12/2021.   She underwent a HST 07-15-2021; results are posted below:  IMPRESSION:  This HST confirms the presence of moderate obstructive sleep apnea, which was much more present during REM sleep than non-REM sleep.   This is an otherwise uncomplicated form of apnea which should be treated with positive airway pressure therapy.  We did not find hypoxia to be present.   RECOMMENDATION: This patient has chronic insomnia and also presents with moderate obstructive sleep apnea , I recommend to start on positive airway pressure therapy: I will order an auto titration device with a pressure setting between 6 and 16 cmH2O pressure, 3 cm EPR, heated humidification and interface of the patient's choice. I also recommend to replace the Ambien with trazodone or Belsomra.  It would be much less likely to have amnestic parasomnia activity on these medications.  So on our revisit today on 12 December 2021 [name] reports that she sleeps better excellent she has started trazodone sometimes alternated with Ambien if she just cannot sleep otherwise.  Both have helped her to fall asleep and stay asleep better with CPAP also seems to be well-tolerated but she still struggling with hot flashes after having to discontinue hormone replacement therapy.  Her original referral to this practice was for migrainous headaches and she had a status migrainosus for weeks or even months and TIA like symptoms with that.   For this reason Dr.  Lavell Anchors urged her to discontinue hormone replacement therapy at the outset of perimenopausal symptoms that affect sleep.  She gets sometimes really hot and the CPAP then is also intolerable.She has discovered recently an antibiotic watch which is a cooling device and that also helps in daytime.    Her migraine headaches have been better on CPAP her morning headaches have resolved her sleepiness is no longer a factor and usually she week wakes refreshed and restored overall she feels that she has a much better life quality now.    The Epworth Sleepiness Scale was today endorsed at only 3 points fatigue severity score at 20 points, and her medication list has been chopped down her compliance record for CPAP shows that she has used the machine 27 out of 30 days but only 15 days over 4 hours consecutively for which she makes a menopausal hot flashes responsible.   Her AutoSet as ordered was set between a pressure of 6 and 16 cmH2O with 3 cm EPR and at this time she has a residual AHI of only 6/h.  The 95th percentile pressure is 12.9 cmH2O and well this in her current settings.  It straddles towards the upper setting and I may increase her maximum pressure to 17 today.  There are no major air leaks.    She sees Dr Jaynee Eagles for migraine. Had global amnesia in 11-2020. Trigeminal nerve pain .  Chief concern :  Presents today with long history of insomnia. Onset while on Bedrest during her pregnancy when she needed benadryl- , and later , while  in Hospital, she was given Ambien.  She is a Music therapist. She was started on [place] 20 yrs ago and has been on that since.  With the Ambien she cannot sleep for 8 hours but still wakes up frequently at night.  Never had a sleep study. She has ordered stuff on the internet and she found online receipts for orders at 3.30 Am and 4.15 Am while under Ambien. Total amnesia for the events. Emails in the middle of the night, invites to parties sent out.    I have the pleasure of  seeing [name]  in consultation on 06-11-2021, a right -handed White or Caucasian female with a possible sleep disorder.  She  has a past medical history of Anxiety, Arthritis, Back pain, Chronic rhinitis, Complication of anesthesia, Depression, Family history of colonic polyps, Hepatic hemangioma, Hyperlipidemia, IBS (irritable bowel syndrome), chronic Insomnia, Migraine, Osteoarthritis, foot, localized, Perimenopause, Peripheral neuropathy, PONV (postoperative nausea and vomiting), Raynauds syndrome, Seasonal allergies, tachycardia 130', PVC. and Von Willebrand's disease ([place]).    Sleep relevant medical history: Sleep walking(?), dental procedures.   Family medical /sleep history: sister with OSA, nobody with insomnia, no sleep walkers.    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 The patient currently works/ used to work in shifts( night/ rotating,), early shift. Pets are present. 2 dogs,Tobacco use/ quit 34 years ago .  ETOH use 2 glasses at night , Caffeine intake in form of Coffee( 1/2 cup in AM ) Soda( /) Tea ( /) or energy drinks. Regular exercise in form of walking.  Had a foot injury- now pilates.   Sleep habits are as follows: The patient's dinner time is between 6.30 PM. The patient goes to bed at 9.30 PM and continues to sleep for 7 hours, wakes for 1 bathroom break.   The preferred sleep position is sideway , with the support of 1 pillow.  Dreams are reportedly frequent/vivid.  She had a 4 months period of cluster headaches with postural component.  5.15  AM is the usual rise time. The patient wakes up spontaneously.  She reports not feeling refreshed or restored in AM, with symptoms such as  morning headaches, and residual fatigue. Naps are attempted frequently, lasting from 20 to 45 minutes and is trying to sleep.    Review of Systems: Out of a complete 14 system review, the patient  complains of only the following symptoms, and all other reviewed systems are negative.:  Fatigue, sleepiness , snoring, fragmented sleep, Insomnia chronic - AMBIEN dependent    How likely are you to doze in the following situations: 0 = not likely, 1 = slight chance, 2 = moderate chance, 3 = high chance   Sitting and Reading? Watching Television? Sitting inactive in a public place (theater or meeting)? As a passenger in a car for an hour without a break? Lying down in the afternoon when circumstances permit? Sitting and talking to someone? Sitting quietly after lunch without alcohol? In a car, while stopped for a few minutes in traffic?   Total = now 3 down from 7/ 24 points  on trazone and CPAP   FSS endorsed at  20 down from 39/ 63 points.   Headaches resolved. Menopausal sleep interference still present.  Social History   Socioeconomic History   Marital status: Married    Spouse name: Not on file   Number of children: Not on file   Years of education: Not on file   Highest education level: Not on file  Occupational History   Not on file  Tobacco Use   Smoking status: Former    Years: 10.00    Types: Cigarettes   Smokeless tobacco: Never  Vaping Use   Vaping Use: Never used  Substance and Sexual Activity   Alcohol use: Yes    Alcohol/week: 1.0 - 2.0 standard drink    Types: 1 - 2 Standard drinks or equivalent per week    Comment: wine or vodka; was previously 1-2 per night, but liver enzymes up so she has cut back   Drug use: No   Sexual activity: Yes    Partners: Male    Comment: husband vasectomy-1st intercourse 26 yo-5 partners  Other Topics Concern   Not on file  Social History Narrative   Lives at home with husband and 2 children who are freshman in college   Right handed   Caffeine: maybe 1 cup/day   Social Determinants of Radio broadcast assistant Strain: Not on file  Food Insecurity: Not on file  Transportation Needs: Not on file  Physical  Activity: Not on file  Stress: Not on file  Social Connections: Not on file    Family History  Problem Relation Age of Onset   Hypertension Mother    Arthritis Mother    Hypertension Father    Heart disease Father    Lupus Sister    Other Sister        mythensia gravis    Past Medical History:  Diagnosis Date   Anxiety    Arthritis    Back pain    Chronic rhinitis    Complication of anesthesia    Depression    Family history of colonic polyps    Hepatic hemangioma    Hyperlipidemia    IBS (irritable bowel syndrome)    Insomnia    Migraine    Osteoarthritis, foot, localized    left   Perimenopause    Peripheral neuropathy    PONV (postoperative nausea and vomiting)    Raynauds syndrome    Seasonal allergies    Von Willebrand's disease    bruises easily    Past Surgical History:  Procedure Laterality Date   BONE EXOSTOSIS EXCISION Left 02/24/2019   Procedure: Dorsal Exostectomy;  Surgeon: Wylene Simmer, MD;  Location: [place];  Service: Orthopedics;  Laterality: Left;   CESAREAN SECTION     laparoscopy removal of right ovarian cyst     left knee arthroscopy     NOVASURE ABLATION  12/2005   pylonidal cyst removal     SEPTOPLASTY     TOOTH EXTRACTION  12/2020     Current Outpatient Medications on File Prior to Visit  Medication Sig Dispense Refill   cholecalciferol (VITAMIN D3) 25 MCG (1000 UNIT) tablet Take 1,000 Units by mouth daily.     conjugated estrogens (PREMARIN) vaginal cream Insert 0.5 g vaginally nightly for 2 weeks then use 2-3 times a week 30 g 2   Cyanocobalamin (VITAMIN B12) 1000 MCG TBCR Take 1 tablet by mouth daily.     gabapentin (NEURONTIN) 300 MG capsule Take 1 capsule by mouth 3 (three) times daily. 90 capsule 11   Galcanezumab-gnlm (EMGALITY) 120 MG/ML SOAJ Inject 120 mg into the  skin every 30 (thirty) days. 1 mL 11   ipratropium (ATROVENT) 0.06 % nasal spray Place 2 sprays into both nostrils as needed for rhinitis.      metoprolol succinate (TOPROL-XL) 25 MG 24 hr tablet TAKE 1 TABLET BY MOUTH ONCE DAILY 90 tablet 3   naratriptan (AMERGE) 2.5 MG tablet Take one tablet by mouth at onset of headache; if returns or does not resolve, may repeat after 2-4 hours; do not exceed two tabs in 24 hours. 9 tablet 11   nystatin cream (MYCOSTATIN) Apply topically 2 times a day 60 g 11   Omega 3 340 MG CPDR Take 2 capsules by mouth daily.     Probiotic Product (PROBIOTIC PO) Take 1 capsule by mouth daily.     rizatriptan (MAXALT-MLT) 10 MG disintegrating tablet DISSOLVE 1 TABLET BY MOUTH AS NEEDED FOR MIGRAINE. MAY REPEAT IN 2 HOURS IF NEEDED 9 tablet 11   saccharomyces boulardii (FLORASTOR) 250 MG capsule Take by mouth. 2 per day     traZODone (DESYREL) 50 MG tablet Take 1-2 tablets by mouth at bedtime. 60 tablet 5   vortioxetine HBr (TRINTELLIX) 10 MG TABS tablet Take 1 tablet by mouth once a day 90 tablet 1   Zinc 50 MG TABS Take 1 tablet by mouth every Monday, Wednesday, and Friday.     zolpidem (AMBIEN) 10 MG tablet TAKE 1 TABLET BY MOUTH AT BEDTIME 90 tablet 1   No current facility-administered medications on file prior to visit.    Allergies  Allergen Reactions   Levaquin [Levofloxacin In D5w] Other (See Comments)    Tendonitis in shoulder, severe yeast infections   Sulfa Antibiotics Other (See Comments)    Unknown reaction - childhood allergy    Physical exam:  Today's Vitals   12/12/21 1007  BP: 121/82  Pulse: 72  Weight: 201 lb (91.2 kg)  Height: 5\' 7"  (1.702 m)   Body mass index is 31.48 kg/m.   Wt Readings from Last 3 Encounters:  12/12/21 201 lb (91.2 kg)  06/12/21 189 lb (85.7 kg)  01/07/21 184 lb (83.5 kg)     Ht Readings from Last 3 Encounters:  12/12/21 5\' 7"  (1.702 m)  06/12/21 5\' 7"  (1.702 m)  01/07/21 5\' 8"  (1.727 m)      General: The patient is awake, alert and appears not in acute distress. The patient is well groomed. Head: Normocephalic, atraumatic. Neck is supple. Mallampati  1-2,  neck circumference:15.5 inches . Nasal airflow  patent.  Retrognathia is not seen.  Dental status: had implants.  Cardiovascular:  Regular rate and cardiac rhythm by pulse,  without distended neck veins. Respiratory: Lungs are clear to auscultation.  Skin:  Without evidence of ankle edema, or rash. Trunk: The patient's posture is erect.   Neurologic exam : The patient is awake and alert, oriented to place and time.   Memory subjective described as intact.  Attention span & concentration ability appears normal.  Speech is fluent,  without  dysarthria, dysphonia or aphasia.  Mood and affect are appropriate.   Cranial nerves: no loss of smell or taste reported  Pupils are equal and briskly reactive to light. Funduscopic exam .  Extraocular movements in vertical and horizontal planes were intact and without nystagmus. No Diplopia. Visual fields by finger perimetry are intact. Hearing was intact to soft voice and finger rubbing.    Facial sensation intact to fine touch.  Facial motor strength is symmetric and tongue and uvula move midline.  Neck  ROM : rotation, tilt and flexion extension were normal for age and shoulder shrug was symmetrical.    Motor exam:  Symmetric bulk, tone and ROM.   Normal tone without cog wheeling, symmetric grip strength .   Sensory:  Fine touch, pinprick and vibration were tested  and  normal.  Proprioception tested in the upper extremities was normal.   Coordination: Rapid alternating movements in the fingers/hands were of normal speed.  The Finger-to-nose maneuver was intact without evidence of ataxia, dysmetria or tremor.   Gait and station: Patient could rise unassisted from a seated position, walked without assistive device.  Stance is of normal width/ base and the patient turned with 3 steps.  Toe and heel walk were deferred.  Deep tendon reflexes: in the  upper and lower extremities are  intact.  Babinski response was deferred.        After  spending a total time of 25  face to face and additional time for physical and neurologic examination, review of laboratory studies,  personal review of imaging studies, reports and results of other testing and review of referral information / records as far as provided in visit, I have established the following assessments:  1) Mrs. MARLETTE CURVIN acquired an insomnia disorder which is now chronic.  When she was pregnant at age 89 with twins and a high risk pregnancy she was forced to be on bedrest, started to use Benadryl at the time as a sleep aid, delivered her babies at 68-week gestational age, and soon after switched to Ambien.  2) OSA treated on CPAP has reduced her fragmentation, and she wakes refreshed, restored, no need for naps, no nocturia, no headaches. She feels her mask FFM dream wear or F30 I , and she sweats under it.  We will turn down the humidifier- turned from 6 to 3 level. Still has dry mouth.   3) menopausal sleep interference since she had to stop HRT- . she can continue gabapentin and trazodone with the occasional rescue Ambien.  Cool watch helps !   My Plan is to proceed with:  Rv in 12 months. Can be with Np.    I would like to thank Dr Jaynee Eagles for allowing me to meet with and to take care of this pleasant patient.   In short, CHRISTYNA LETENDRE is presenting with improvement in menopausal and chronic insomnia and OSA is treated well.    CC: I will share my notes with referring MD.  Electronically signed by: Larey Seat, MD 12/12/2021 10:35 AM  [facility] Sleep Board certified by The [employer] of Sleep Medicine and Diplomate of the [employer] of Sleep Medicine. Board certified In Neurology through the [place], Fellow of the [employer] of Neurology. Medical Director of [employer].

## 2021-12-13 ENCOUNTER — Other Ambulatory Visit (HOSPITAL_COMMUNITY): Payer: Self-pay

## 2021-12-19 DIAGNOSIS — G4733 Obstructive sleep apnea (adult) (pediatric): Secondary | ICD-10-CM | POA: Diagnosis not present

## 2022-01-13 ENCOUNTER — Other Ambulatory Visit (HOSPITAL_COMMUNITY): Payer: Self-pay

## 2022-01-13 DIAGNOSIS — M79672 Pain in left foot: Secondary | ICD-10-CM | POA: Diagnosis not present

## 2022-01-13 DIAGNOSIS — M19072 Primary osteoarthritis, left ankle and foot: Secondary | ICD-10-CM | POA: Diagnosis not present

## 2022-01-13 MED ORDER — MELOXICAM 7.5 MG PO TABS
ORAL_TABLET | ORAL | 0 refills | Status: DC
  Filled 2022-01-13: qty 60, 30d supply, fill #0

## 2022-01-18 ENCOUNTER — Other Ambulatory Visit (HOSPITAL_COMMUNITY): Payer: Self-pay

## 2022-01-31 DIAGNOSIS — M19072 Primary osteoarthritis, left ankle and foot: Secondary | ICD-10-CM | POA: Diagnosis not present

## 2022-02-20 DIAGNOSIS — R748 Abnormal levels of other serum enzymes: Secondary | ICD-10-CM | POA: Diagnosis not present

## 2022-02-27 ENCOUNTER — Other Ambulatory Visit (HOSPITAL_COMMUNITY): Payer: Self-pay

## 2022-02-27 MED ORDER — FLUCONAZOLE 150 MG PO TABS
ORAL_TABLET | ORAL | 0 refills | Status: DC
  Filled 2022-02-27: qty 5, 15d supply, fill #0

## 2022-02-28 ENCOUNTER — Other Ambulatory Visit (HOSPITAL_COMMUNITY): Payer: Self-pay

## 2022-03-01 ENCOUNTER — Other Ambulatory Visit (HOSPITAL_COMMUNITY): Payer: Self-pay

## 2022-03-03 ENCOUNTER — Other Ambulatory Visit (HOSPITAL_COMMUNITY): Payer: Self-pay

## 2022-03-03 DIAGNOSIS — M25562 Pain in left knee: Secondary | ICD-10-CM | POA: Diagnosis not present

## 2022-03-03 DIAGNOSIS — G8929 Other chronic pain: Secondary | ICD-10-CM | POA: Insufficient documentation

## 2022-03-03 MED ORDER — MELOXICAM 7.5 MG PO TABS
ORAL_TABLET | ORAL | 0 refills | Status: DC
  Filled 2022-03-03: qty 60, 30d supply, fill #0

## 2022-03-03 MED ORDER — MELOXICAM 15 MG PO TABS
15.0000 mg | ORAL_TABLET | Freq: Every day | ORAL | 1 refills | Status: DC
  Filled 2022-03-03: qty 30, 30d supply, fill #0

## 2022-03-03 MED ORDER — METOPROLOL SUCCINATE ER 25 MG PO TB24
25.0000 mg | ORAL_TABLET | Freq: Every day | ORAL | 2 refills | Status: DC
  Filled 2022-03-03: qty 90, 90d supply, fill #0
  Filled 2022-03-28 – 2022-05-28 (×2): qty 90, 90d supply, fill #1

## 2022-03-04 ENCOUNTER — Other Ambulatory Visit (HOSPITAL_COMMUNITY): Payer: Self-pay

## 2022-03-21 DIAGNOSIS — G4733 Obstructive sleep apnea (adult) (pediatric): Secondary | ICD-10-CM | POA: Diagnosis not present

## 2022-03-26 DIAGNOSIS — M25562 Pain in left knee: Secondary | ICD-10-CM | POA: Diagnosis not present

## 2022-03-28 ENCOUNTER — Other Ambulatory Visit: Payer: Self-pay | Admitting: Neurology

## 2022-03-28 ENCOUNTER — Other Ambulatory Visit (HOSPITAL_COMMUNITY): Payer: Self-pay

## 2022-03-31 ENCOUNTER — Other Ambulatory Visit (HOSPITAL_COMMUNITY): Payer: Self-pay

## 2022-03-31 MED ORDER — ZOLPIDEM TARTRATE 10 MG PO TABS
10.0000 mg | ORAL_TABLET | Freq: Every day | ORAL | 1 refills | Status: DC
  Filled 2022-03-31: qty 90, 90d supply, fill #0

## 2022-03-31 NOTE — Telephone Encounter (Signed)
Patient is up to date on her appointments and due for a refill on ambien. [place] Controlled Substance Registry checked and is appropriate.

## 2022-04-01 ENCOUNTER — Other Ambulatory Visit (HOSPITAL_COMMUNITY): Payer: Self-pay

## 2022-04-20 ENCOUNTER — Encounter: Payer: Self-pay | Admitting: Cardiovascular Disease

## 2022-04-20 NOTE — Progress Notes (Signed)
This encounter was created in error - please disregard.

## 2022-04-22 ENCOUNTER — Encounter: Payer: 59 | Admitting: Cardiovascular Disease

## 2022-04-28 ENCOUNTER — Other Ambulatory Visit: Payer: Self-pay | Admitting: Neurology

## 2022-04-28 ENCOUNTER — Other Ambulatory Visit (HOSPITAL_COMMUNITY): Payer: Self-pay

## 2022-04-28 MED ORDER — TRINTELLIX 10 MG PO TABS
10.0000 mg | ORAL_TABLET | Freq: Every day | ORAL | 0 refills | Status: DC
  Filled 2022-04-28 – 2022-05-02 (×2): qty 90, 90d supply, fill #0

## 2022-04-28 NOTE — Telephone Encounter (Signed)
LMVM for pt that connecting to see if taking gabapentin?  May mychart as well.

## 2022-04-29 ENCOUNTER — Other Ambulatory Visit (HOSPITAL_COMMUNITY): Payer: Self-pay

## 2022-04-30 ENCOUNTER — Other Ambulatory Visit (HOSPITAL_COMMUNITY): Payer: Self-pay

## 2022-05-01 NOTE — Telephone Encounter (Signed)
Spoke to pt she is taking BID dosing , she said for hot flashes which contributed to migraines.  She is trying to wean off of and has appt next 3/ 2024.  I placed order, with 8 refills.

## 2022-05-02 ENCOUNTER — Other Ambulatory Visit (HOSPITAL_COMMUNITY): Payer: Self-pay

## 2022-05-02 DIAGNOSIS — R682 Dry mouth, unspecified: Secondary | ICD-10-CM | POA: Diagnosis not present

## 2022-05-02 DIAGNOSIS — M79672 Pain in left foot: Secondary | ICD-10-CM | POA: Diagnosis not present

## 2022-05-02 DIAGNOSIS — E663 Overweight: Secondary | ICD-10-CM | POA: Diagnosis not present

## 2022-05-02 DIAGNOSIS — Z6829 Body mass index (BMI) 29.0-29.9, adult: Secondary | ICD-10-CM | POA: Diagnosis not present

## 2022-05-03 MED ORDER — GABAPENTIN 300 MG PO CAPS
300.0000 mg | ORAL_CAPSULE | Freq: Two times a day (BID) | ORAL | 8 refills | Status: DC
  Filled 2022-05-03: qty 60, 30d supply, fill #0
  Filled 2022-05-28: qty 60, 30d supply, fill #1
  Filled 2022-07-27: qty 60, 30d supply, fill #2
  Filled 2022-08-23 (×3): qty 60, 30d supply, fill #3
  Filled 2022-10-02: qty 60, 30d supply, fill #4
  Filled 2022-10-08 – 2022-10-29 (×2): qty 60, 30d supply, fill #5
  Filled 2022-11-25: qty 60, 30d supply, fill #6
  Filled 2023-01-03: qty 60, 30d supply, fill #7
  Filled 2023-01-30: qty 60, 30d supply, fill #8

## 2022-05-05 ENCOUNTER — Other Ambulatory Visit (HOSPITAL_COMMUNITY): Payer: Self-pay

## 2022-05-13 ENCOUNTER — Other Ambulatory Visit (HOSPITAL_COMMUNITY): Payer: Self-pay

## 2022-05-13 DIAGNOSIS — M25562 Pain in left knee: Secondary | ICD-10-CM | POA: Diagnosis not present

## 2022-05-14 ENCOUNTER — Other Ambulatory Visit (HOSPITAL_COMMUNITY): Payer: Self-pay

## 2022-05-28 ENCOUNTER — Other Ambulatory Visit: Payer: Self-pay | Admitting: Neurology

## 2022-05-28 ENCOUNTER — Other Ambulatory Visit (HOSPITAL_COMMUNITY): Payer: Self-pay

## 2022-05-29 ENCOUNTER — Other Ambulatory Visit (HOSPITAL_COMMUNITY): Payer: Self-pay

## 2022-05-29 MED ORDER — RIZATRIPTAN BENZOATE 10 MG PO TBDP
ORAL_TABLET | ORAL | 0 refills | Status: DC
  Filled 2022-05-29: qty 9, 30d supply, fill #0

## 2022-05-30 ENCOUNTER — Other Ambulatory Visit (HOSPITAL_COMMUNITY): Payer: Self-pay

## 2022-06-02 ENCOUNTER — Other Ambulatory Visit (HOSPITAL_COMMUNITY): Payer: Self-pay

## 2022-06-02 DIAGNOSIS — S83242A Other tear of medial meniscus, current injury, left knee, initial encounter: Secondary | ICD-10-CM | POA: Diagnosis not present

## 2022-06-02 DIAGNOSIS — S83249A Other tear of medial meniscus, current injury, unspecified knee, initial encounter: Secondary | ICD-10-CM | POA: Insufficient documentation

## 2022-06-02 MED ORDER — ONDANSETRON HCL 4 MG PO TABS
ORAL_TABLET | ORAL | 0 refills | Status: DC
  Filled 2022-06-02: qty 30, 7d supply, fill #0

## 2022-06-26 ENCOUNTER — Other Ambulatory Visit (HOSPITAL_COMMUNITY): Payer: Self-pay

## 2022-06-26 DIAGNOSIS — F3341 Major depressive disorder, recurrent, in partial remission: Secondary | ICD-10-CM | POA: Diagnosis not present

## 2022-06-26 DIAGNOSIS — D6800 Von Willebrand disease, unspecified: Secondary | ICD-10-CM | POA: Diagnosis not present

## 2022-06-26 DIAGNOSIS — Z0000 Encounter for general adult medical examination without abnormal findings: Secondary | ICD-10-CM | POA: Diagnosis not present

## 2022-06-26 DIAGNOSIS — Z780 Asymptomatic menopausal state: Secondary | ICD-10-CM | POA: Diagnosis not present

## 2022-06-26 DIAGNOSIS — G43009 Migraine without aura, not intractable, without status migrainosus: Secondary | ICD-10-CM | POA: Diagnosis not present

## 2022-06-26 DIAGNOSIS — Z1231 Encounter for screening mammogram for malignant neoplasm of breast: Secondary | ICD-10-CM | POA: Diagnosis not present

## 2022-06-26 DIAGNOSIS — Z8619 Personal history of other infectious and parasitic diseases: Secondary | ICD-10-CM | POA: Diagnosis not present

## 2022-06-26 DIAGNOSIS — E785 Hyperlipidemia, unspecified: Secondary | ICD-10-CM | POA: Diagnosis not present

## 2022-06-26 DIAGNOSIS — G4700 Insomnia, unspecified: Secondary | ICD-10-CM | POA: Diagnosis not present

## 2022-06-26 DIAGNOSIS — G4733 Obstructive sleep apnea (adult) (pediatric): Secondary | ICD-10-CM | POA: Diagnosis not present

## 2022-06-26 DIAGNOSIS — I493 Ventricular premature depolarization: Secondary | ICD-10-CM | POA: Diagnosis not present

## 2022-06-26 MED ORDER — FLUCONAZOLE 150 MG PO TABS
150.0000 mg | ORAL_TABLET | ORAL | 1 refills | Status: DC | PRN
  Filled 2022-06-26: qty 15, 15d supply, fill #0
  Filled 2023-03-15: qty 15, 15d supply, fill #1

## 2022-06-27 ENCOUNTER — Other Ambulatory Visit (HOSPITAL_COMMUNITY): Payer: Self-pay

## 2022-07-07 DIAGNOSIS — M79672 Pain in left foot: Secondary | ICD-10-CM | POA: Diagnosis not present

## 2022-07-07 DIAGNOSIS — M19072 Primary osteoarthritis, left ankle and foot: Secondary | ICD-10-CM | POA: Diagnosis not present

## 2022-07-18 ENCOUNTER — Other Ambulatory Visit (HOSPITAL_COMMUNITY): Payer: Self-pay

## 2022-07-18 ENCOUNTER — Other Ambulatory Visit: Payer: Self-pay | Admitting: Neurology

## 2022-07-24 ENCOUNTER — Other Ambulatory Visit (HOSPITAL_COMMUNITY): Payer: Self-pay

## 2022-07-24 DIAGNOSIS — X58XXXA Exposure to other specified factors, initial encounter: Secondary | ICD-10-CM | POA: Diagnosis not present

## 2022-07-24 DIAGNOSIS — G8918 Other acute postprocedural pain: Secondary | ICD-10-CM | POA: Diagnosis not present

## 2022-07-24 DIAGNOSIS — S83232A Complex tear of medial meniscus, current injury, left knee, initial encounter: Secondary | ICD-10-CM | POA: Diagnosis not present

## 2022-07-24 DIAGNOSIS — Y999 Unspecified external cause status: Secondary | ICD-10-CM | POA: Diagnosis not present

## 2022-07-24 DIAGNOSIS — M94262 Chondromalacia, left knee: Secondary | ICD-10-CM | POA: Diagnosis not present

## 2022-07-24 MED ORDER — PROMETHAZINE HCL 25 MG PO TABS
25.0000 mg | ORAL_TABLET | ORAL | 0 refills | Status: DC
  Filled 2022-07-24: qty 40, 7d supply, fill #0

## 2022-07-24 MED ORDER — CEPHALEXIN 500 MG PO CAPS
500.0000 mg | ORAL_CAPSULE | Freq: Four times a day (QID) | ORAL | 0 refills | Status: DC
  Filled 2022-07-24: qty 12, 3d supply, fill #0

## 2022-07-24 MED ORDER — ONDANSETRON HCL 4 MG PO TABS
4.0000 mg | ORAL_TABLET | ORAL | 0 refills | Status: DC | PRN
  Filled 2022-07-24: qty 30, 5d supply, fill #0

## 2022-07-24 MED ORDER — METHOCARBAMOL 500 MG PO TABS
500.0000 mg | ORAL_TABLET | Freq: Four times a day (QID) | ORAL | 0 refills | Status: DC
  Filled 2022-07-24: qty 40, 5d supply, fill #0

## 2022-07-24 MED ORDER — OXYCODONE HCL 5 MG PO TABS
5.0000 mg | ORAL_TABLET | ORAL | 0 refills | Status: DC
  Filled 2022-07-24: qty 40, 7d supply, fill #0

## 2022-07-27 ENCOUNTER — Other Ambulatory Visit: Payer: Self-pay | Admitting: Neurology

## 2022-07-27 ENCOUNTER — Other Ambulatory Visit (HOSPITAL_COMMUNITY): Payer: Self-pay

## 2022-07-28 ENCOUNTER — Other Ambulatory Visit (HOSPITAL_COMMUNITY): Payer: Self-pay

## 2022-07-28 MED ORDER — TRAZODONE HCL 50 MG PO TABS
50.0000 mg | ORAL_TABLET | Freq: Every day | ORAL | 5 refills | Status: DC
  Filled 2022-07-28: qty 60, 30d supply, fill #0
  Filled 2022-08-23: qty 60, 30d supply, fill #1
  Filled 2022-10-02: qty 60, 30d supply, fill #2
  Filled 2022-10-08 – 2022-10-29 (×2): qty 60, 30d supply, fill #3
  Filled 2022-11-25: qty 60, 30d supply, fill #4
  Filled 2023-01-03: qty 60, 30d supply, fill #5

## 2022-07-31 ENCOUNTER — Other Ambulatory Visit (HOSPITAL_COMMUNITY): Payer: Self-pay

## 2022-07-31 DIAGNOSIS — M25562 Pain in left knee: Secondary | ICD-10-CM | POA: Diagnosis not present

## 2022-07-31 MED ORDER — TRINTELLIX 10 MG PO TABS
10.0000 mg | ORAL_TABLET | Freq: Every day | ORAL | 3 refills | Status: DC
  Filled 2022-07-31: qty 90, 90d supply, fill #0
  Filled 2022-11-25: qty 90, 90d supply, fill #1
  Filled 2023-02-05 – 2023-03-15 (×3): qty 90, 90d supply, fill #2
  Filled 2023-06-21: qty 90, 90d supply, fill #3

## 2022-08-01 ENCOUNTER — Other Ambulatory Visit (HOSPITAL_COMMUNITY): Payer: Self-pay

## 2022-08-02 ENCOUNTER — Telehealth: Payer: 59 | Admitting: Physician Assistant

## 2022-08-02 DIAGNOSIS — R3989 Other symptoms and signs involving the genitourinary system: Secondary | ICD-10-CM

## 2022-08-02 MED ORDER — NITROFURANTOIN MONOHYD MACRO 100 MG PO CAPS: 100.0000 mg | ORAL_CAPSULE | Freq: Two times a day (BID) | ORAL | 0 refills | Status: DC

## 2022-08-02 NOTE — Progress Notes (Signed)
I have spent 5 minutes in review of e-visit questionnaire, review and updating patient chart, medical decision making and response to patient.   Roisin Mones Cody Ivyonna Hoelzel, PA-C    

## 2022-08-02 NOTE — Progress Notes (Signed)

## 2022-08-04 DIAGNOSIS — M79672 Pain in left foot: Secondary | ICD-10-CM | POA: Diagnosis not present

## 2022-08-08 ENCOUNTER — Other Ambulatory Visit (HOSPITAL_COMMUNITY): Payer: Self-pay | Admitting: Orthopedic Surgery

## 2022-08-08 ENCOUNTER — Other Ambulatory Visit (HOSPITAL_COMMUNITY): Payer: Self-pay

## 2022-08-08 DIAGNOSIS — M19072 Primary osteoarthritis, left ankle and foot: Secondary | ICD-10-CM | POA: Diagnosis not present

## 2022-08-08 DIAGNOSIS — M6702 Short Achilles tendon (acquired), left ankle: Secondary | ICD-10-CM | POA: Insufficient documentation

## 2022-08-12 ENCOUNTER — Telehealth: Payer: 59 | Admitting: Family Medicine

## 2022-08-12 DIAGNOSIS — N390 Urinary tract infection, site not specified: Secondary | ICD-10-CM | POA: Diagnosis not present

## 2022-08-12 DIAGNOSIS — R1110 Vomiting, unspecified: Secondary | ICD-10-CM | POA: Diagnosis not present

## 2022-08-12 NOTE — Progress Notes (Signed)
[place]   Needs in person for reoccurring UTI and with questionable Kidney involvement.  Message sent.

## 2022-08-23 ENCOUNTER — Other Ambulatory Visit (HOSPITAL_COMMUNITY): Payer: Self-pay

## 2022-08-25 ENCOUNTER — Other Ambulatory Visit (HOSPITAL_COMMUNITY): Payer: Self-pay

## 2022-08-25 DIAGNOSIS — R8290 Unspecified abnormal findings in urine: Secondary | ICD-10-CM | POA: Diagnosis not present

## 2022-08-25 DIAGNOSIS — N390 Urinary tract infection, site not specified: Secondary | ICD-10-CM | POA: Diagnosis not present

## 2022-08-25 MED ORDER — CIPROFLOXACIN HCL 500 MG PO TABS
500.0000 mg | ORAL_TABLET | Freq: Two times a day (BID) | ORAL | 0 refills | Status: AC
  Filled 2022-08-25: qty 6, 3d supply, fill #0

## 2022-08-26 ENCOUNTER — Other Ambulatory Visit (HOSPITAL_COMMUNITY): Payer: Self-pay

## 2022-08-26 MED ORDER — METOPROLOL SUCCINATE ER 25 MG PO TB24
25.0000 mg | ORAL_TABLET | Freq: Every day | ORAL | 0 refills | Status: DC
  Filled 2022-08-26: qty 90, 90d supply, fill #0

## 2022-08-27 ENCOUNTER — Other Ambulatory Visit (HOSPITAL_COMMUNITY): Payer: Self-pay

## 2022-09-03 ENCOUNTER — Other Ambulatory Visit (HOSPITAL_COMMUNITY): Payer: Self-pay

## 2022-09-03 DIAGNOSIS — F32A Depression, unspecified: Secondary | ICD-10-CM | POA: Insufficient documentation

## 2022-09-03 DIAGNOSIS — L900 Lichen sclerosus et atrophicus: Secondary | ICD-10-CM | POA: Diagnosis not present

## 2022-09-03 DIAGNOSIS — I471 Supraventricular tachycardia, unspecified: Secondary | ICD-10-CM | POA: Insufficient documentation

## 2022-09-03 DIAGNOSIS — R300 Dysuria: Secondary | ICD-10-CM | POA: Diagnosis not present

## 2022-09-03 MED ORDER — CLOBETASOL PROPIONATE 0.05 % EX OINT
1.0000 | TOPICAL_OINTMENT | Freq: Two times a day (BID) | CUTANEOUS | 0 refills | Status: DC
  Filled 2022-09-03: qty 30, 15d supply, fill #0

## 2022-09-11 ENCOUNTER — Encounter (HOSPITAL_BASED_OUTPATIENT_CLINIC_OR_DEPARTMENT_OTHER): Payer: Self-pay | Admitting: Orthopedic Surgery

## 2022-09-11 NOTE — Progress Notes (Signed)
   09/11/22 1013  PAT Phone Screen  Is the patient taking a GLP-1 receptor agonist? No  Do You Have Diabetes? No  Do You Have Hypertension? (S)  No (metop for SVT)  Have You Ever Been to the ER for Asthma? No  Have You Taken Oral Steroids in the Past 3 Months? No  Do you Take Phenteramine or any Other Diet Drugs? No  Recent  Lab Work, EKG, CXR? No  Do you have a history of heart problems? (S)  Yes (hx SVT- controlled PCP manages)  Cardiologist Name PCP follows  Have you ever had tests on your heart? Yes  What cardiac tests were performed? (S)  Echo;EKG  What date/year were cardiac tests completed? echo 2/22, EKG >44yrneeds update  Results viewable: [facility] Media Tab  Any Recent Hospitalizations? No  Height '5\' 8"'$  (1.727 m)  Weight 86.2 kg  Pat Appointment Scheduled (S)  Yes [facility]

## 2022-09-12 DIAGNOSIS — R102 Pelvic and perineal pain: Secondary | ICD-10-CM | POA: Diagnosis not present

## 2022-09-16 DIAGNOSIS — R519 Headache, unspecified: Secondary | ICD-10-CM | POA: Diagnosis not present

## 2022-09-16 DIAGNOSIS — Z791 Long term (current) use of non-steroidal anti-inflammatories (NSAID): Secondary | ICD-10-CM | POA: Diagnosis not present

## 2022-09-16 DIAGNOSIS — Z79899 Other long term (current) drug therapy: Secondary | ICD-10-CM | POA: Diagnosis not present

## 2022-09-16 DIAGNOSIS — M25775 Osteophyte, left foot: Secondary | ICD-10-CM | POA: Diagnosis not present

## 2022-09-16 DIAGNOSIS — I7300 Raynaud's syndrome without gangrene: Secondary | ICD-10-CM | POA: Diagnosis not present

## 2022-09-16 DIAGNOSIS — I10 Essential (primary) hypertension: Secondary | ICD-10-CM | POA: Diagnosis not present

## 2022-09-16 DIAGNOSIS — F32A Depression, unspecified: Secondary | ICD-10-CM | POA: Diagnosis not present

## 2022-09-16 DIAGNOSIS — Z87891 Personal history of nicotine dependence: Secondary | ICD-10-CM | POA: Diagnosis not present

## 2022-09-16 DIAGNOSIS — M19072 Primary osteoarthritis, left ankle and foot: Secondary | ICD-10-CM | POA: Diagnosis not present

## 2022-09-16 DIAGNOSIS — D6800 Von Willebrand disease, unspecified: Secondary | ICD-10-CM | POA: Diagnosis not present

## 2022-09-16 DIAGNOSIS — F419 Anxiety disorder, unspecified: Secondary | ICD-10-CM | POA: Diagnosis not present

## 2022-09-16 DIAGNOSIS — M659 Synovitis and tenosynovitis, unspecified: Secondary | ICD-10-CM | POA: Diagnosis not present

## 2022-09-16 DIAGNOSIS — M6702 Short Achilles tendon (acquired), left ankle: Secondary | ICD-10-CM | POA: Diagnosis not present

## 2022-09-16 DIAGNOSIS — G4730 Sleep apnea, unspecified: Secondary | ICD-10-CM | POA: Diagnosis not present

## 2022-09-18 ENCOUNTER — Encounter (HOSPITAL_BASED_OUTPATIENT_CLINIC_OR_DEPARTMENT_OTHER)
Admission: RE | Disposition: A | Discharge status: Home / Self Care | Payer: Self-pay | Source: Home / Self Care | Attending: Orthopedic Surgery

## 2022-09-18 ENCOUNTER — Ambulatory Visit (HOSPITAL_BASED_OUTPATIENT_CLINIC_OR_DEPARTMENT_OTHER)
Admission: RE | Admit: 2022-09-18 | Discharge: 2022-09-18 | Disposition: A | Discharge status: Home / Self Care | Payer: 59 | Attending: Orthopedic Surgery | Admitting: Orthopedic Surgery

## 2022-09-18 ENCOUNTER — Other Ambulatory Visit (HOSPITAL_COMMUNITY): Payer: Self-pay

## 2022-09-18 ENCOUNTER — Encounter (HOSPITAL_BASED_OUTPATIENT_CLINIC_OR_DEPARTMENT_OTHER): Payer: Self-pay | Admitting: Orthopedic Surgery

## 2022-09-18 ENCOUNTER — Other Ambulatory Visit: Payer: Self-pay

## 2022-09-18 ENCOUNTER — Ambulatory Visit (HOSPITAL_BASED_OUTPATIENT_CLINIC_OR_DEPARTMENT_OTHER): Payer: 59 | Admitting: Anesthesiology

## 2022-09-18 ENCOUNTER — Ambulatory Visit (HOSPITAL_BASED_OUTPATIENT_CLINIC_OR_DEPARTMENT_OTHER): Payer: 59

## 2022-09-18 DIAGNOSIS — M19072 Primary osteoarthritis, left ankle and foot: Secondary | ICD-10-CM | POA: Insufficient documentation

## 2022-09-18 DIAGNOSIS — Z791 Long term (current) use of non-steroidal anti-inflammatories (NSAID): Secondary | ICD-10-CM | POA: Insufficient documentation

## 2022-09-18 DIAGNOSIS — Z87891 Personal history of nicotine dependence: Secondary | ICD-10-CM | POA: Insufficient documentation

## 2022-09-18 DIAGNOSIS — G8918 Other acute postprocedural pain: Secondary | ICD-10-CM | POA: Diagnosis not present

## 2022-09-18 DIAGNOSIS — M6702 Short Achilles tendon (acquired), left ankle: Secondary | ICD-10-CM | POA: Diagnosis not present

## 2022-09-18 DIAGNOSIS — F32A Depression, unspecified: Secondary | ICD-10-CM | POA: Insufficient documentation

## 2022-09-18 DIAGNOSIS — R519 Headache, unspecified: Secondary | ICD-10-CM | POA: Insufficient documentation

## 2022-09-18 DIAGNOSIS — I10 Essential (primary) hypertension: Secondary | ICD-10-CM | POA: Diagnosis not present

## 2022-09-18 DIAGNOSIS — M659 Synovitis and tenosynovitis, unspecified: Secondary | ICD-10-CM | POA: Insufficient documentation

## 2022-09-18 DIAGNOSIS — I7300 Raynaud's syndrome without gangrene: Secondary | ICD-10-CM | POA: Insufficient documentation

## 2022-09-18 DIAGNOSIS — Z01818 Encounter for other preprocedural examination: Secondary | ICD-10-CM

## 2022-09-18 DIAGNOSIS — G4730 Sleep apnea, unspecified: Secondary | ICD-10-CM | POA: Diagnosis not present

## 2022-09-18 DIAGNOSIS — F419 Anxiety disorder, unspecified: Secondary | ICD-10-CM | POA: Insufficient documentation

## 2022-09-18 DIAGNOSIS — D6800 Von Willebrand disease, unspecified: Secondary | ICD-10-CM | POA: Insufficient documentation

## 2022-09-18 DIAGNOSIS — M25775 Osteophyte, left foot: Secondary | ICD-10-CM | POA: Diagnosis not present

## 2022-09-18 DIAGNOSIS — Z79899 Other long term (current) drug therapy: Secondary | ICD-10-CM | POA: Insufficient documentation

## 2022-09-18 HISTORY — PX: FOOT ARTHRODESIS: SHX1655

## 2022-09-18 HISTORY — PX: ARTHROTOMY: SHX5728

## 2022-09-18 HISTORY — DX: Cardiac arrhythmia, unspecified: I49.9

## 2022-09-18 HISTORY — PX: GASTROCNEMIUS RECESSION: SHX863

## 2022-09-18 SURGERY — RECESSION, MUSCLE, GASTROCNEMIUS
Anesthesia: General | Site: Leg Lower | Laterality: Left

## 2022-09-18 MED ORDER — MIDAZOLAM HCL 2 MG/2ML IJ SOLN
INTRAMUSCULAR | Status: AC
  Filled 2022-09-18: qty 2

## 2022-09-18 MED ORDER — FENTANYL CITRATE (PF) 100 MCG/2ML IJ SOLN
INTRAMUSCULAR | Status: AC
  Filled 2022-09-18: qty 2

## 2022-09-18 MED ORDER — DEXAMETHASONE SODIUM PHOSPHATE 10 MG/ML IJ SOLN
INTRAMUSCULAR | Status: DC | PRN
  Administered 2022-09-18: 5 mg via INTRAVENOUS

## 2022-09-18 MED ORDER — MIDAZOLAM HCL 2 MG/2ML IJ SOLN
2.0000 mg | Freq: Once | INTRAMUSCULAR | Status: AC
  Administered 2022-09-18: 2 mg via INTRAVENOUS

## 2022-09-18 MED ORDER — PROPOFOL 500 MG/50ML IV EMUL
INTRAVENOUS | Status: DC | PRN
  Administered 2022-09-18: 25 ug/kg/min via INTRAVENOUS

## 2022-09-18 MED ORDER — 0.9 % SODIUM CHLORIDE (POUR BTL) OPTIME
TOPICAL | Status: DC | PRN
  Administered 2022-09-18: 300 mL

## 2022-09-18 MED ORDER — FENTANYL CITRATE (PF) 100 MCG/2ML IJ SOLN
INTRAMUSCULAR | Status: DC | PRN
  Administered 2022-09-18 (×2): 25 ug via INTRAVENOUS

## 2022-09-18 MED ORDER — BUPIVACAINE-EPINEPHRINE (PF) 0.5% -1:200000 IJ SOLN
INTRAMUSCULAR | Status: DC | PRN
  Administered 2022-09-18: 30 mL via PERINEURAL

## 2022-09-18 MED ORDER — TRANEXAMIC ACID-NACL 1000-0.7 MG/100ML-% IV SOLN
INTRAVENOUS | Status: AC
  Filled 2022-09-18: qty 100

## 2022-09-18 MED ORDER — LACTATED RINGERS IV SOLN: INTRAVENOUS | Status: DC

## 2022-09-18 MED ORDER — TRANEXAMIC ACID-NACL 1000-0.7 MG/100ML-% IV SOLN
INTRAVENOUS | Status: DC | PRN
  Administered 2022-09-18: 1000 mg via INTRAVENOUS

## 2022-09-18 MED ORDER — FENTANYL CITRATE (PF) 100 MCG/2ML IJ SOLN
50.0000 ug | Freq: Once | INTRAMUSCULAR | Status: AC
  Administered 2022-09-18: 50 ug via INTRAVENOUS

## 2022-09-18 MED ORDER — LIDOCAINE 2% (20 MG/ML) 5 ML SYRINGE
INTRAMUSCULAR | Status: DC | PRN
  Administered 2022-09-18: 40 mg via INTRAVENOUS

## 2022-09-18 MED ORDER — SODIUM CHLORIDE 0.9 % IV SOLN: INTRAVENOUS | Status: DC

## 2022-09-18 MED ORDER — VANCOMYCIN HCL 500 MG IV SOLR
INTRAVENOUS | Status: DC | PRN
  Administered 2022-09-18: 500 mg via TOPICAL

## 2022-09-18 MED ORDER — CEFAZOLIN SODIUM-DEXTROSE 2-4 GM/100ML-% IV SOLN
2.0000 g | INTRAVENOUS | Status: AC
  Administered 2022-09-18: 2 g via INTRAVENOUS

## 2022-09-18 MED ORDER — LIDOCAINE 2% (20 MG/ML) 5 ML SYRINGE
INTRAMUSCULAR | Status: AC
  Filled 2022-09-18: qty 5

## 2022-09-18 MED ORDER — VANCOMYCIN HCL 500 MG IV SOLR
INTRAVENOUS | Status: AC
  Filled 2022-09-18: qty 10

## 2022-09-18 MED ORDER — PROPOFOL 500 MG/50ML IV EMUL
INTRAVENOUS | Status: AC
  Filled 2022-09-18: qty 50

## 2022-09-18 MED ORDER — DEXAMETHASONE SODIUM PHOSPHATE 10 MG/ML IJ SOLN
INTRAMUSCULAR | Status: AC
  Filled 2022-09-18: qty 1

## 2022-09-18 MED ORDER — OXYCODONE HCL 5 MG PO TABS
5.0000 mg | ORAL_TABLET | ORAL | 0 refills | Status: AC | PRN
  Filled 2022-09-18: qty 30, 5d supply, fill #0

## 2022-09-18 MED ORDER — ROPIVACAINE HCL 5 MG/ML IJ SOLN
INTRAMUSCULAR | Status: DC | PRN
  Administered 2022-09-18: 15 mL via PERINEURAL

## 2022-09-18 MED ORDER — DOCUSATE SODIUM 100 MG PO CAPS
100.0000 mg | ORAL_CAPSULE | Freq: Two times a day (BID) | ORAL | 0 refills | Status: DC
  Filled 2022-09-18: qty 30, 15d supply, fill #0

## 2022-09-18 MED ORDER — PHENYLEPHRINE HCL (PRESSORS) 10 MG/ML IV SOLN
INTRAVENOUS | Status: DC | PRN
  Administered 2022-09-18: 80 ug via INTRAVENOUS

## 2022-09-18 MED ORDER — ONDANSETRON HCL 4 MG/2ML IJ SOLN
INTRAMUSCULAR | Status: DC | PRN
  Administered 2022-09-18: 4 mg via INTRAVENOUS

## 2022-09-18 MED ORDER — CEFAZOLIN SODIUM-DEXTROSE 2-4 GM/100ML-% IV SOLN
INTRAVENOUS | Status: AC
  Filled 2022-09-18: qty 100

## 2022-09-18 MED ORDER — SENNA 8.6 MG PO TABS
2.0000 | ORAL_TABLET | Freq: Two times a day (BID) | ORAL | 0 refills | Status: DC
  Filled 2022-09-18: qty 30, 8d supply, fill #0

## 2022-09-18 MED ORDER — PROPOFOL 10 MG/ML IV BOLUS
INTRAVENOUS | Status: DC | PRN
  Administered 2022-09-18: 200 mg via INTRAVENOUS

## 2022-09-18 MED ORDER — ONDANSETRON HCL 4 MG/2ML IJ SOLN
INTRAMUSCULAR | Status: AC
  Filled 2022-09-18: qty 2

## 2022-09-18 SURGICAL SUPPLY — 110 items
APL PRP STRL LF DISP 70% ISPRP (MISCELLANEOUS) ×2
BANDAGE ESMARK 6X9 LF (GAUZE/BANDAGES/DRESSINGS) IMPLANT
BIT DRILL 2.7XCANN QCK CNCT (BIT) IMPLANT
BIT DRILL CAL 2.5 ST W/SLV (BIT) IMPLANT
BIT DRILL CANN 2.7 (BIT) ×2
BIT DRILL STRATUM 2.0 STRL (BIT) IMPLANT
BIT DRL 2.7XCANN QCK CNCT (BIT) ×2
BIT TREPHINE CORING 8 (BIT) IMPLANT
BLADE AVERAGE 25X9 (BLADE) IMPLANT
BLADE MICRO SAGITTAL (BLADE) IMPLANT
BLADE SURG 15 STRL LF DISP TIS (BLADE) ×4 IMPLANT
BLADE SURG 15 STRL SS (BLADE) ×10
BNDG CMPR 9X6 STRL LF SNTH (GAUZE/BANDAGES/DRESSINGS)
BNDG ELASTIC 4X5.8 VLCR STR LF (GAUZE/BANDAGES/DRESSINGS) ×2 IMPLANT
BNDG ELASTIC 6X5.8 VLCR STR LF (GAUZE/BANDAGES/DRESSINGS) ×2 IMPLANT
BNDG ESMARK 6X9 LF (GAUZE/BANDAGES/DRESSINGS)
BONE CHIP PRESERV 5CC PCAN5 (Bone Implant) ×2 IMPLANT
BOOT STEPPER DURA LG (SOFTGOODS) IMPLANT
BOOT STEPPER DURA MED (SOFTGOODS) IMPLANT
BOOT STEPPER DURA XLG (SOFTGOODS) IMPLANT
CHLORAPREP W/TINT 26 (MISCELLANEOUS) ×2 IMPLANT
CNTNR URN SCR LID CUP LEK RST (MISCELLANEOUS) IMPLANT
CONT SPEC 4OZ STRL OR WHT (MISCELLANEOUS) ×4
COVER BACK TABLE 60X90IN (DRAPES) ×2 IMPLANT
COVER MAYO STAND STRL (DRAPES) ×2 IMPLANT
CUFF TOURN SGL QUICK 34 (TOURNIQUET CUFF)
CUFF TRNQT CYL 34X4.125X (TOURNIQUET CUFF) IMPLANT
DRAPE EXTREMITY T 121X128X90 (DISPOSABLE) ×2 IMPLANT
DRAPE OEC MINIVIEW 54X84 (DRAPES) ×2 IMPLANT
DRAPE SURG 17X23 STRL (DRAPES) IMPLANT
DRAPE U-SHAPE 47X51 STRL (DRAPES) ×2 IMPLANT
DRAPE UTILITY XL STRL (DRAPES) ×2 IMPLANT
DRILL STRATUM 2.0 STRL (BIT) ×2
DRSG MEPITEL 4X7.2 (GAUZE/BANDAGES/DRESSINGS) ×2 IMPLANT
ELECT REM PT RETURN 9FT ADLT (ELECTROSURGICAL) ×2
ELECTRODE REM PT RTRN 9FT ADLT (ELECTROSURGICAL) ×2 IMPLANT
GAUZE PAD ABD 8X10 STRL (GAUZE/BANDAGES/DRESSINGS) ×4 IMPLANT
GAUZE SPONGE 4X4 12PLY STRL (GAUZE/BANDAGES/DRESSINGS) ×2 IMPLANT
GLOVE BIO SURGEON STRL SZ8 (GLOVE) ×2 IMPLANT
GLOVE BIOGEL PI IND STRL 7.0 (GLOVE) IMPLANT
GLOVE BIOGEL PI IND STRL 8 (GLOVE) ×4 IMPLANT
GLOVE ECLIPSE 8.0 STRL XLNG CF (GLOVE) ×2 IMPLANT
GLOVE SURG SS PI 7.0 STRL IVOR (GLOVE) IMPLANT
GOWN STRL REUS W/ TWL LRG LVL3 (GOWN DISPOSABLE) ×2 IMPLANT
GOWN STRL REUS W/ TWL XL LVL3 (GOWN DISPOSABLE) ×4 IMPLANT
GOWN STRL REUS W/TWL LRG LVL3 (GOWN DISPOSABLE) ×2
GOWN STRL REUS W/TWL XL LVL3 (GOWN DISPOSABLE) ×4
GRAFT BNE CANC CHIPS 1-8 5CC (Bone Implant) IMPLANT
K-WIRE TROC 1.25X150 (WIRE) ×2
KIT BONE MRW ASP ANGEL CPRP (KITS) IMPLANT
KIT INST RS STD STRATUM (KITS) IMPLANT
KIT STRATUM INSTRUMENT STD (KITS) IMPLANT
KWIRE TROC 1.25X150 (WIRE) IMPLANT
LOOP VASCULAR MINI 18 RED (MISCELLANEOUS)
LOOP VESSEL MAXI BLUE (MISCELLANEOUS) IMPLANT
NDL 18GX1X1/2 (RX/OR ONLY) (NEEDLE) IMPLANT
NDL HYPO 25X1 1.5 SAFETY (NEEDLE) IMPLANT
NDL SAFETY ECLIP 18X1.5 (MISCELLANEOUS) IMPLANT
NEEDLE 18GX1X1/2 (RX/OR ONLY) (NEEDLE) ×2 IMPLANT
NEEDLE HYPO 22GX1.5 SAFETY (NEEDLE) ×2 IMPLANT
NEEDLE HYPO 25X1 1.5 SAFETY (NEEDLE) IMPLANT
NS IRRIG 1000ML POUR BTL (IV SOLUTION) ×2 IMPLANT
PACK BASIN DAY SURGERY FS (CUSTOM PROCEDURE TRAY) ×2 IMPLANT
PAD CAST 4YDX4 CTTN HI CHSV (CAST SUPPLIES) ×2 IMPLANT
PADDING CAST COTTON 4X4 STRL (CAST SUPPLIES) ×2
PADDING CAST COTTON 6X4 STRL (CAST SUPPLIES) ×2 IMPLANT
PENCIL SMOKE EVACUATOR (MISCELLANEOUS) ×2 IMPLANT
PLATE STRATUM 5H RS (Plate) IMPLANT
PLATE STRT STRM 4H (Plate) IMPLANT
SANITIZER HAND PURELL FF 515ML (MISCELLANEOUS) ×2 IMPLANT
SCREW  STRATUM NL LP ST 3.5X18 (Screw) ×2 IMPLANT
SCREW CANN 4.0X30 (Screw) ×2 IMPLANT
SCREW CANN 4.0X38MM (Screw) ×2 IMPLANT
SCREW CANN PT 30X4XCANN NS SM (Screw) ×2 IMPLANT
SCREW CANN PT 4X30 NS (Screw) IMPLANT
SCREW CANN PT 4X38 NS (Screw) IMPLANT
SCREW CANN PT 4X42 NS (Screw) IMPLANT
SCREW CANNULATED PT 4.0X42 (Screw) ×2 IMPLANT
SCREW LOCK STRATUM 3.5X18 (Screw) IMPLANT
SCREW LOCK STRATUM 3.5X20 (Screw) IMPLANT
SCREW LOW PROFILE 3.5MMX42 (Screw) IMPLANT
SCREW NLOCK LP ST STRM 3.5X14 (Screw) IMPLANT
SCREW STRATUM LOCK 2.4X18 RS (Screw) IMPLANT
SCREW STRATUM LOCK 2.4X20 RS (Screw) IMPLANT
SCREW STRATUM NL LP ST 3.5X18 (Screw) IMPLANT
SCREW STRATUM RS  NL 2.4X14 (Screw) ×2 IMPLANT
SCREW STRATUM RS NL 2.4X14 (Screw) IMPLANT
SCREW STRATUM RS NL 2.4X22 (Screw) IMPLANT
SHEET MEDIUM DRAPE 40X70 STRL (DRAPES) ×2 IMPLANT
SLEEVE SCD COMPRESS KNEE MED (STOCKING) ×2 IMPLANT
SPLINT PLASTER CAST FAST 5X30 (CAST SUPPLIES) ×40 IMPLANT
SPONGE SURGIFOAM ABS GEL 12-7 (HEMOSTASIS) IMPLANT
SPONGE T-LAP 18X18 ~~LOC~~+RFID (SPONGE) ×2 IMPLANT
STOCKINETTE 6  STRL (DRAPES) ×2
STOCKINETTE 6 STRL (DRAPES) ×2 IMPLANT
SUCTION FRAZIER HANDLE 10FR (MISCELLANEOUS) ×2
SUCTION TUBE FRAZIER 10FR DISP (MISCELLANEOUS) ×2 IMPLANT
SUT ETHILON 3 0 PS 1 (SUTURE) ×2 IMPLANT
SUT MNCRL AB 3-0 PS2 18 (SUTURE) ×2 IMPLANT
SUT VIC AB 2-0 SH 27 (SUTURE) ×2
SUT VIC AB 2-0 SH 27XBRD (SUTURE) ×2 IMPLANT
SUT VICRYL 0 SH 27 (SUTURE) IMPLANT
SYR 5ML LL (SYRINGE) IMPLANT
SYR BULB EAR ULCER 3OZ GRN STR (SYRINGE) ×2 IMPLANT
SYR CONTROL 10ML LL (SYRINGE) IMPLANT
TOWEL GREEN STERILE FF (TOWEL DISPOSABLE) ×4 IMPLANT
TUBE CONNECTING 20X1/4 (TUBING) ×2 IMPLANT
UNDERPAD 30X36 HEAVY ABSORB (UNDERPADS AND DIAPERS) ×2 IMPLANT
VASCULAR TIE MINI RED 18IN STL (MISCELLANEOUS) IMPLANT
YANKAUER SUCT BULB TIP NO VENT (SUCTIONS) IMPLANT

## 2022-09-18 NOTE — Discharge Instructions (Addendum)
Wylene Simmer, MD [facility]  Please read the following information regarding your care after surgery.  Medications  You only need a prescription for the narcotic pain medicine (ex. oxycodone, Percocet, Norco).  All of the other medicines listed below are available over the counter. ? Resume Meloxicam for the first 3 days after surgery. ? acetominophen (Tylenol) 650 mg every 4-6 hours as you need for minor to moderate pain ? oxycodone as prescribed for severe pain  Narcotic pain medicine (ex. oxycodone, Percocet, Vicodin) will cause constipation.  To prevent this problem, take the following medicines while you are taking any pain medicine. ? docusate sodium (Colace) 100 mg twice a day ? senna (Senokot) 2 tablets twice a day   Weight Bearing ? Do not bear any weight on the operated leg or foot.  Cast / Splint / Dressing ? Keep your splint, cast or dressing clean and dry.  Don't put anything (coat hanger, pencil, etc) down inside of it.  If it gets damp, use a hair dryer on the cool setting to dry it.  If it gets soaked, call the office to schedule an appointment for a cast change.   After your dressing, cast or splint is removed; you may shower, but do not soak or scrub the wound.  Allow the water to run over it, and then gently pat it dry.  Swelling It is normal for you to have swelling where you had surgery.  To reduce swelling and pain, keep your toes above your nose for at least 3 days after surgery.  It may be necessary to keep your foot or leg elevated for several weeks.  If it hurts, it should be elevated.  Follow Up Call my office at [phone] when you are discharged from the hospital or surgery center to schedule an appointment to be seen two weeks after surgery.  Call my office at [phone] if you develop a fever >101.5 F, nausea, vomiting, bleeding from the surgical site or severe pain.     Post Anesthesia Home Care Instructions  Activity: Get plenty of rest for  the remainder of the day. A responsible individual must stay with you for 24 hours following the procedure.  For the next 24 hours, DO NOT: -Drive a car -Paediatric nurse -Drink alcoholic beverages -Take any medication unless instructed by your physician -Make any legal decisions or sign important papers.  Meals: Start with liquid foods such as gelatin or soup. Progress to regular foods as tolerated. Avoid greasy, spicy, heavy foods. If nausea and/or vomiting occur, drink only clear liquids until the nausea and/or vomiting subsides. Call your physician if vomiting continues.  Special Instructions/Symptoms: Your throat may feel dry or sore from the anesthesia or the breathing tube placed in your throat during surgery. If this causes discomfort, gargle with warm salt water. The discomfort should disappear within 24 hours.  If you had a scopolamine patch placed behind your ear for the management of post- operative nausea and/or vomiting:  1. The medication in the patch is effective for 72 hours, after which it should be removed.  Wrap patch in a tissue and discard in the trash. Wash hands thoroughly with soap and water. 2. You may remove the patch earlier than 72 hours if you experience unpleasant side effects which may include dry mouth, dizziness or visual disturbances. 3. Avoid touching the patch. Wash your hands with soap and water after contact with the patch.   Regional Anesthesia Blocks  1. Numbness or the inability to  move the "blocked" extremity may last from 3-48 hours after placement. The length of time depends on the medication injected and your individual response to the medication. If the numbness is not going away after 48 hours, call your surgeon.  2. The extremity that is blocked will need to be protected until the numbness is gone and the  Strength has returned. Because you cannot feel it, you will need to take extra care to avoid injury. Because it may be weak, you may have  difficulty moving it or using it. You may not know what position it is in without looking at it while the block is in effect.  3. For blocks in the legs and feet, returning to weight bearing and walking needs to be done carefully. You will need to wait until the numbness is entirely gone and the strength has returned. You should be able to move your leg and foot normally before you try and bear weight or walk. You will need someone to be with you when you first try to ensure you do not fall and possibly risk injury.  4. Bruising and tenderness at the needle site are common side effects and will resolve in a few days.  5. Persistent numbness or new problems with movement should be communicated to the surgeon or the [facility] [phone] [place] [phone]).

## 2022-09-18 NOTE — Transfer of Care (Signed)
Immediate Anesthesia Transfer of Care Note  Patient: [name]  Procedure(s) Performed: Gastrocnemius Recession (Left: Leg Lower) Arthrodesis Tarsal Metatarsal Joint 1-3 (Left: Foot) Fourth and Fifth Tarsal Metatarsal Phalangeal Arthrotomy and Debridement; Protein Rich Plasma injection to 4-5 Tarsalmetarsal joint (Left: Foot)  Patient Location: PACU  Anesthesia Type:GA combined with regional for post-op pain  Level of Consciousness: awake, alert , and oriented  Airway & Oxygen Therapy: Patient Spontanous Breathing and Patient connected to face mask oxygen  Post-op Assessment: Report given to RN and Post -op Vital signs reviewed and stable  Post vital signs: Reviewed and stable  Last Vitals:  Vitals Value Taken Time  BP 123/69 09/18/22 1245  Temp    Pulse 94 09/18/22 1246  Resp 11 09/18/22 1246  SpO2 100 % 09/18/22 1246  Vitals shown include unvalidated device data.  Last Pain:  Vitals:   09/18/22 0913  TempSrc: Oral  PainSc: 0-No pain         Complications: No notable events documented.

## 2022-09-18 NOTE — Anesthesia Procedure Notes (Signed)
Procedure Name: LMA Insertion Date/Time: 09/18/2022 10:20 AM  Performed by: Maryella Shivers, CRNAPre-anesthesia Checklist: Patient identified, Emergency Drugs available, Suction available and Patient being monitored Patient Re-evaluated:Patient Re-evaluated prior to induction Oxygen Delivery Method: Circle system utilized Preoxygenation: Pre-oxygenation with 100% oxygen Induction Type: IV induction Ventilation: Mask ventilation without difficulty LMA: LMA inserted LMA Size: 4.0 Number of attempts: 1 Airway Equipment and Method: Bite block Placement Confirmation: positive ETCO2 Tube secured with: Tape Dental Injury: Teeth and Oropharynx as per pre-operative assessment

## 2022-09-18 NOTE — Anesthesia Procedure Notes (Addendum)
Anesthesia Regional Block: Popliteal block   Pre-Anesthetic Checklist: , timeout performed,  Correct Patient, Correct Site, Correct Laterality,  Correct Procedure, Correct Position, site marked,  Risks and benefits discussed,  Surgical consent,  Pre-op evaluation,  At surgeon's request and post-op pain management  Laterality: Left  Prep: chloraprep       Needles:  Injection technique: Single-shot  Needle Type: Echogenic Stimulator Needle     Needle Length: 9cm  Needle Gauge: 21     Additional Needles:   Procedures:,,,, ultrasound used (permanent image in chart),,    Narrative:  Start time: 09/18/2022 9:25 AM End time: 09/18/2022 9:30 AM Injection made incrementally with aspirations every 5 mL.  Performed by: Personally  Anesthesiologist: Effie Berkshire, MD  Additional Notes: Discussed risks and benefits of the nerve block in detail, including but not limited vascular injury, permanent nerve damage and infection.   Patient tolerated the procedure well. Local anesthetic introduced in an incremental fashion under minimal resistance after negative aspirations. No paresthesias were elicited. After completion of the procedure, no acute issues were identified and patient continued to be monitored by RN.

## 2022-09-18 NOTE — Anesthesia Preprocedure Evaluation (Addendum)
Anesthesia Evaluation  Patient identified by MRN, date of birth, ID band Patient awake    Reviewed: Allergy & Precautions, NPO status , Patient's Chart, lab work & pertinent test results  History of Anesthesia Complications (+) PONV and history of anesthetic complications  Airway Mallampati: II  TM Distance: >3 FB Neck ROM: Full    Dental  (+) Teeth Intact, Dental Advisory Given   Pulmonary sleep apnea , former smoker   breath sounds clear to auscultation       Cardiovascular hypertension,  Rhythm:Regular Rate:Normal     Neuro/Psych  Headaches PSYCHIATRIC DISORDERS Anxiety Depression    TIA   GI/Hepatic negative GI ROS, Neg liver ROS,,,  Endo/Other  negative endocrine ROS    Renal/GU negative Renal ROS     Musculoskeletal  (+) Arthritis ,    Abdominal   Peds  Hematology negative hematology ROS (+)   Anesthesia Other Findings   Reproductive/Obstetrics                             Anesthesia Physical Anesthesia Plan  ASA: 3  Anesthesia Plan: General   Post-op Pain Management: Regional block*   Induction: Intravenous  PONV Risk Score and Plan: 4 or greater and Ondansetron, Dexamethasone, Midazolam and Scopolamine patch - Pre-op  Airway Management Planned: LMA  Additional Equipment: None  Intra-op Plan:   Post-operative Plan: Extubation in OR  Informed Consent: I have reviewed the patients History and Physical, chart, labs and discussed the procedure including the risks, benefits and alternatives for the proposed anesthesia with the patient or authorized representative who has indicated his/her understanding and acceptance.     Dental advisory given  Plan Discussed with: CRNA  Anesthesia Plan Comments:        Anesthesia Quick Evaluation

## 2022-09-18 NOTE — Progress Notes (Signed)
Assisted Dr. Smith Robert with left, adductor canal, popliteal, ultrasound guided block. Side rails up, monitors on throughout procedure. See vital signs in flow sheet. Tolerated Procedure well.

## 2022-09-18 NOTE — Anesthesia Procedure Notes (Signed)
Anesthesia Regional Block: Adductor canal block   Pre-Anesthetic Checklist: , timeout performed,  Correct Patient, Correct Site, Correct Laterality,  Correct Procedure, Correct Position, site marked,  Risks and benefits discussed,  Surgical consent,  Pre-op evaluation,  At surgeon's request and post-op pain management  Laterality: Left  Prep: chloraprep       Needles:  Injection technique: Single-shot  Needle Type: Echogenic Stimulator Needle     Needle Length: 9cm  Needle Gauge: 21     Additional Needles:   Procedures:,,,, ultrasound used (permanent image in chart),,    Narrative:  Start time: 09/18/2022 9:30 AM End time: 09/18/2022 9:35 AM Injection made incrementally with aspirations every 5 mL.  Performed by: Personally  Anesthesiologist: Effie Berkshire, MD  Additional Notes: Discussed risks and benefits of the nerve block in detail, including but not limited vascular injury, permanent nerve damage and infection.   Patient tolerated the procedure well. Local anesthetic introduced in an incremental fashion under minimal resistance after negative aspirations. No paresthesias were elicited. After completion of the procedure, no acute issues were identified and patient continued to be monitored by RN.

## 2022-09-18 NOTE — Op Note (Signed)
09/18/2022  12:53 PM  PATIENT:  [name]  61 y.o. female  PRE-OPERATIVE DIAGNOSIS: 1.  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  Short left achilles tendon  POST-OPERATIVE DIAGNOSIS:  same  Procedure(s):  Left Gastrocnemius Recession 2.  Left Arthrodesis Tarsal Metatarsal Joint 1-3 3.  Left 4-5 TMT jiont arthrotomy and debridement with PRP injection 4.  Left foot AP, lateral and oblique xrays  SURGEON:  Wylene Simmer, MD  ASSISTANT: Mechele Claude, PA-C  ANESTHESIA:   General, regional  EBL:  minimal   TOURNIQUET:   Total Tourniquet Time Documented: Thigh (Left) - 116 minutes Total: Thigh (Left) - 160 minutes  COMPLICATIONS:  None apparent  DISPOSITION:  Extubated, awake and stable to recovery.  INDICATION FOR PROCEDURE:61 y/o female with chronic and worsening left midfoot pain due to arthritis.  She also has a tight heelcord.  She has failed non op treatment and presents today for midfoot arthrodesis and gastroc recession.  The risks and benefits of the alternative treatment options have been discussed in detail.  The patient wishes to proceed with surgery and specifically understands risks of bleeding, infection, nerve damage, blood clots, need for additional surgery, amputation and death.   PROCEDURE IN DETAIL:  After pre operative consent was obtained, and the correct operative site was identified, the patient was brought to the operating room and placed supine on the OR table.  Anesthesia was administered.  Pre-operative antibiotics were administered.  TXA was administered A surgical timeout was taken.  Her extremity was prepped and draped in standard sterile fashion with a tourniquet around the thigh.  The extremity was elevated, the tourniquet was inflated to 250 mm mercury.  Longitudinal incision was made over the medial calf.  Dissection was carried down through the subcutaneous tissues.  The medial fascia was incised.  The gastrocnemius tendon was identified.   It was divided under direct vision taking care to protect the sural nerve posteriorly.  The ankle was then dorsiflex 20 degrees with the knee extended.  The wound was irrigated and sprinkled with vancomycin powder.  Subcutaneous tissues were approximated with Monocryl.  Skin incision was closed with nylon.  Attention was turned to the dorsal midfoot.  The previous dorsal incision was identified.  It was opened again sharply and dissection carried down through the subcutaneous tissues.  The interval between the EHL and EHB was developed.  Subperiosteal dissection was carried dorsally across the first and second TMT joints.  Both joints were opened and mobilized.  Attention was then turned to the lateral midfoot where an incision was made at the third webspace.  Dissection was carried sharply down through the subcutaneous tissues.  An arthrotomy was then made releasing the joint capsule for the fourth and fifth tarsometatarsal joints.  Both were noted to have significant dorsal arthritis.  Dorsal osteophytes were removed from the cuboid with a rondure.  The synovitis was removed sharply as well.  Small fragments of cartilage were removed from the dorsal aspect of the joint.  The third TMT joint was opened and the joint mobilized.  The remaining articular cartilage and subchondral bone was removed with curettes and rongeurs.  The second TMT joint was prepared in the same fashion.  An oscillating saw was used to remove the remaining articular cartilage and subchondral bone from both sides of the first TMT joint.  The 3 medial TMT joints were irrigated copiously.  A small drill bit was used to perforate both sides  of the joint.  Cancellous allograft chips were soaked in PRP and placed in all 3 joints.  The first TMT joint was then provisionally pinned.  Radiographs showed appropriate alignment of the joint.  The joint was then compressed with a 4 mm [employer] partially-threaded cannulated screw.  The joint was  then further stabilized with an LPS screw from distal to proximal.  A 4-hole stratum inline plate template was placed over the dorsum of the second TMT joint.  It was positioned appropriately.  The proximal tine holes were drilled and the template removed.  The plate was then pre-bent and impacted into position proximally.  It was further secured to the middle cuneiform with a nonlocking screw.  The compression slot distally was then drilled and used to compress the second TMT joint appropriately.  Nonlocking and locking bicortical screws were placed in the second metatarsal.  The compression pin was removed and replaced with a nonlocking screw.  A K wire was then inserted from the medial cuneiform across to the second metatarsal base.  Attention was turned to the third TMT joint.  Stratum RS 5 hold inline plate template was positioned appropriately.  It was drilled proximally and removed.  The 5 hole plate was then impacted into position.  An accessory hole was made allowing exposure of the distal ramp.  The compression pin was inserted.  The plate was secured proximally with another nonlocking screw in the cuneiform.  The compression slot was then utilized to compress the third TMT joint appropriately.  A locking and nonlocking bicortical screw were placed in the third metatarsal.  The compression pin was removed and replaced with a nonlocking screw.  Attention was returned to the medial midfoot.  The previously placed guidepin was advanced across to the third metatarsal.  It was measured and overdrilled.  A partially-threaded 4 mm cannulated screw was then inserted and compressed across from the medial cuneiform to the third metatarsal base.  Final AP, lateral and oblique radiographs showed appropriate position and length of all hardware and appropriate arthrodesis of the first, second and third TMT joints.  The wounds were irrigated copiously and sprinkled with vancomycin powder.  Subcutaneous tissues  were approximated with 3-0 Monocryl and skin incisions were closed with nylon.  Approximately 2 cc of PRP was then injected into the fourth and fifth TMT joints.  Sterile dressings were then applied followed by a compression wrap and a short leg splint.  The tourniquet was released after application of the dressings.  The patient was awakened from anesthesia and transported to the recovery room in stable condition.   FOLLOW UP PLAN: Nonweightbearing on the left lower extremity.  Follow-up in the office in 2 weeks for suture removal and conversion to a short leg cast.  No indication for DVT prophylaxis in this patient with von Willebrand's.  RADIOGRAPHS: AP, lateral and oblique radiographs of the left foot were obtained intraoperatively.  These show interval arthrodesis of the first, second and third TMT joints.  Hardware is appropriately positioned and of the appropriate lengths.  No acute injuries are noted.    Mechele Claude PA-C was present and scrubbed for the duration of the operative case. His assistance was essential in positioning the patient, prepping and draping, gaining and maintaining exposure, performing the operation, closing and dressing the wounds and applying the splint.

## 2022-09-18 NOTE — H&P (Signed)
[name] is an 61 y.o. female.   Chief Complaint: Left foot pain HPI: 61 year old female with a past medical history significant for von Willebrand's disease complains of chronic left midfoot pain.  She has significant arthritis of the first, second and third TMT joints as well has a tight heel cord.  She has failed nonoperative treatment to date including activity modification, oral anti-inflammatories, shoewear modification, orthotics and intra-articular steroid injections.  She presents today for surgical treatment of these painful and limiting conditions.  Past Medical History:  Diagnosis Date   Anxiety    Arthritis    Back pain    Chronic rhinitis    Complication of anesthesia    Depression    Dysrhythmia    hx svt- pcp follows   Family history of colonic polyps    Hepatic hemangioma    Hyperlipidemia    IBS (irritable bowel syndrome)    Insomnia    Migraine    Osteoarthritis, foot, localized    left   Perimenopause    Peripheral neuropathy    PONV (postoperative nausea and vomiting)    Raynauds syndrome    Seasonal allergies    Von Willebrand's disease ([place])    bruises easily    Past Surgical History:  Procedure Laterality Date   BONE EXOSTOSIS EXCISION Left 02/24/2019   Procedure: Dorsal Exostectomy;  Surgeon: Wylene Simmer, MD;  Location: [facility];  Service: Orthopedics;  Laterality: Left;   CESAREAN SECTION     laparoscopy removal of right ovarian cyst     left knee arthroscopy     NOVASURE ABLATION  12/2005   pylonidal cyst removal     SEPTOPLASTY     TOOTH EXTRACTION  12/2020    Family History  Problem Relation Age of Onset   Hypertension Mother    Arthritis Mother    Hypertension Father    Heart disease Father    Lupus Sister    Other Sister        mythensia gravis   Social History:  reports that she has quit smoking. Her smoking use included cigarettes. She has never used smokeless tobacco. She reports current alcohol use of  about 1.0 - 2.0 standard drink of alcohol per week. She reports that she does not use drugs.  Allergies:  Allergies  Allergen Reactions   Ciprofloxacin     Tendonitis    Levaquin [Levofloxacin In D5w] Other (See Comments)    Tendonitis in shoulder, severe yeast infections   Sulfa Antibiotics Other (See Comments)    Unknown reaction - childhood allergy    Medications Prior to Admission  Medication Sig Dispense Refill   cholecalciferol (VITAMIN D3) 25 MCG (1000 UNIT) tablet Take 1,000 Units by mouth daily.     gabapentin (NEURONTIN) 300 MG capsule Take 1 capsule (300 mg total) by mouth 2 (two) times daily. 60 capsule 8   meloxicam (MOBIC) 15 MG tablet Take 1 tablet (15 mg total) by mouth daily. 30 tablet 1   meloxicam (MOBIC) 7.5 MG tablet Take 1 tablet by mouth twice a day for 2 weeks and then as needed 60 tablet 0   metoprolol succinate (TOPROL-XL) 25 MG 24 hr tablet Take 1 tablet (25 mg total) by mouth daily. 90 tablet 2   naratriptan (AMERGE) 2.5 MG tablet Take one tablet by mouth at onset of headache; if returns or does not resolve, may repeat after 2-4 hours; do not exceed two tabs in 24 hours. 9 tablet 11  Probiotic Product (PROBIOTIC PO) Take 1 capsule by mouth daily.     traZODone (DESYREL) 50 MG tablet Take 1 - 2 tablets by mouth at bedtime. 60 tablet 5   vortioxetine HBr (TRINTELLIX) 10 MG TABS tablet Take 1 tablet (10 mg total) by mouth daily. 90 tablet 3   clobetasol ointment (TEMOVATE) 0.05 % Apply 1 thin Application topically to affected areas 2 (two) times daily. 30 g 0   conjugated estrogens (PREMARIN) vaginal cream Insert 0.5 g vaginally nightly for 2 weeks then use 2-3 times a week 30 g 2   Cyanocobalamin (VITAMIN B12) 1000 MCG TBCR Take 1 tablet by mouth daily.     fluconazole (DIFLUCAN) 150 MG tablet Take 1 tablet (150 mg total) by mouth as needed candidiasis flareup 15 tablet 1   Galcanezumab-gnlm (EMGALITY) 120 MG/ML SOAJ Inject 120 mg into the skin every 30 (thirty)  days. 1 mL 11   ipratropium (ATROVENT) 0.06 % nasal spray Place 2 sprays into both nostrils as needed for rhinitis.     methocarbamol (ROBAXIN) 500 MG tablet Take 1-2 tablets (500-1,000 mg total) by mouth 4 (four) times daily as needed for muscle spasms 40 tablet 0   metoprolol succinate (TOPROL-XL) 25 MG 24 hr tablet Take 1 tablet (25 mg total) by mouth daily. 90 tablet 0   nystatin cream (MYCOSTATIN) Apply topically 2 times a day 60 g 11   Omega 3 340 MG CPDR Take 2 capsules by mouth daily.     ondansetron (ZOFRAN) 4 MG tablet Take 1 tablet by mouth  every 4 hours as needed. 30 tablet 0   oxyCODONE (OXY IR/ROXICODONE) 5 MG immediate release tablet Take 1 tablet (5 mg total) by mouth every 4 to 6 hours as needed for severe pain 40 tablet 0   promethazine (PHENERGAN) 25 MG tablet Take 1 tablet (25 mg total) by mouth every 4 (four) hours as needed for nausea 40 tablet 0   rizatriptan (MAXALT-MLT) 10 MG disintegrating tablet DISSOLVE 1 TABLET BY MOUTH AS NEEDED FOR MIGRAINE. MAY REPEAT IN 2 HOURS IF NEEDED 9 tablet 0   saccharomyces boulardii (FLORASTOR) 250 MG capsule Take by mouth. 2 per day     Zinc 50 MG TABS Take 1 tablet by mouth every Monday, Wednesday, and Friday.      No results found for this or any previous visit (from the past 48 hour(s)). DG MINI C-ARM IMAGE ONLY  Result Date: 09/18/2022 There is no interpretation for this exam.  This order is for images obtained during a surgical procedure.  Please See "Surgeries" Tab for more information regarding the procedure.    Review of Systems No recent fever, chills, nausea, vomiting or changes in her appetite Blood pressure 131/75, pulse 88, temperature (!) 96.9 F (36.1 C), temperature source Oral, resp. rate 13, height '5\' 8"'$  (1.727 m), weight 85.6 kg, last menstrual period 11/14/2012, SpO2 97 %. Physical Exam  Well-nourished well-developed woman in no apparent distress.  Alert and oriented.  Normal mood and affect.  Extraocular motions  are intact.  Respirations are unlabored.  Heel cord on the left is tight.  Skin is healthy and intact over the midfoot.  No lymphadenopathy.  Pulses are palpable in the foot.    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  The risks and benefits of the alternative treatment  options have been discussed in detail.  The patient wishes to proceed with surgery and specifically understands risks of bleeding, infection, nerve damage, blood clots, need for additional surgery, amputation and death.   Wylene Simmer, MD 10/16/22, 9:52 AM

## 2022-09-18 NOTE — Anesthesia Postprocedure Evaluation (Signed)
Anesthesia Post Note  Patient: [name]  Procedure(s) Performed: Gastrocnemius Recession (Left: Leg Lower) Arthrodesis Tarsal Metatarsal Joint 1-3 (Left: Foot) Fourth and Fifth Tarsal Metatarsal Phalangeal Arthrotomy and Debridement; Protein Rich Plasma injection to 4-5 Tarsalmetarsal joint (Left: Foot)     Patient location during evaluation: PACU Anesthesia Type: General Level of consciousness: awake and alert Pain management: pain level controlled Vital Signs Assessment: post-procedure vital signs reviewed and stable Respiratory status: spontaneous breathing, nonlabored ventilation, respiratory function stable and patient connected to nasal cannula oxygen Cardiovascular status: blood pressure returned to baseline and stable Postop Assessment: no apparent nausea or vomiting Anesthetic complications: no   No notable events documented.  Last Vitals:  Vitals:   09/18/22 1300 09/18/22 1330  BP: 125/75 (!) 140/66  Pulse: 96 87  Resp: 18 18  Temp:  (!) 36.1 C  SpO2: 95% 97%    Last Pain:  Vitals:   09/18/22 1330  TempSrc:   PainSc: 0-No pain                 Effie Berkshire

## 2022-09-22 ENCOUNTER — Encounter (HOSPITAL_BASED_OUTPATIENT_CLINIC_OR_DEPARTMENT_OTHER): Payer: Self-pay | Admitting: Orthopedic Surgery

## 2022-10-01 DIAGNOSIS — M19072 Primary osteoarthritis, left ankle and foot: Secondary | ICD-10-CM | POA: Diagnosis not present

## 2022-10-01 DIAGNOSIS — Z4889 Encounter for other specified surgical aftercare: Secondary | ICD-10-CM | POA: Diagnosis not present

## 2022-10-02 ENCOUNTER — Other Ambulatory Visit (HOSPITAL_COMMUNITY): Payer: Self-pay

## 2022-10-08 ENCOUNTER — Other Ambulatory Visit: Payer: Self-pay

## 2022-10-08 ENCOUNTER — Other Ambulatory Visit (HOSPITAL_COMMUNITY): Payer: Self-pay

## 2022-10-08 MED ORDER — HYDROCODONE-ACETAMINOPHEN 5-325 MG PO TABS
1.0000 | ORAL_TABLET | ORAL | 0 refills | Status: DC | PRN
  Filled 2022-10-08: qty 30, 5d supply, fill #0

## 2022-10-08 MED ORDER — MELOXICAM 7.5 MG PO TABS
7.5000 mg | ORAL_TABLET | Freq: Two times a day (BID) | ORAL | 0 refills | Status: DC
  Filled 2022-10-08: qty 60, 30d supply, fill #0

## 2022-10-09 ENCOUNTER — Other Ambulatory Visit (HOSPITAL_COMMUNITY): Payer: Self-pay

## 2022-10-11 ENCOUNTER — Other Ambulatory Visit (HOSPITAL_COMMUNITY): Payer: Self-pay

## 2022-10-29 ENCOUNTER — Other Ambulatory Visit: Payer: Self-pay

## 2022-10-29 ENCOUNTER — Other Ambulatory Visit (HOSPITAL_COMMUNITY): Payer: Self-pay

## 2022-10-29 ENCOUNTER — Other Ambulatory Visit: Payer: Self-pay | Admitting: Neurology

## 2022-10-30 ENCOUNTER — Other Ambulatory Visit (HOSPITAL_COMMUNITY): Payer: Self-pay

## 2022-10-30 ENCOUNTER — Other Ambulatory Visit: Payer: Self-pay

## 2022-10-30 MED ORDER — NARATRIPTAN HCL 2.5 MG PO TABS
2.5000 mg | ORAL_TABLET | ORAL | 3 refills | Status: DC | PRN
  Filled 2022-10-30: qty 9, 27d supply, fill #0
  Filled 2022-11-25: qty 9, 27d supply, fill #1
  Filled 2023-01-03: qty 9, 27d supply, fill #2
  Filled 2023-01-30: qty 9, 27d supply, fill #3

## 2022-10-31 ENCOUNTER — Other Ambulatory Visit (HOSPITAL_COMMUNITY): Payer: Self-pay

## 2022-10-31 DIAGNOSIS — M19072 Primary osteoarthritis, left ankle and foot: Secondary | ICD-10-CM | POA: Diagnosis not present

## 2022-10-31 DIAGNOSIS — Z4889 Encounter for other specified surgical aftercare: Secondary | ICD-10-CM | POA: Diagnosis not present

## 2022-11-25 ENCOUNTER — Other Ambulatory Visit: Payer: Self-pay

## 2022-11-25 ENCOUNTER — Other Ambulatory Visit (HOSPITAL_COMMUNITY): Payer: Self-pay

## 2022-11-26 ENCOUNTER — Other Ambulatory Visit (HOSPITAL_COMMUNITY): Payer: Self-pay

## 2022-11-26 ENCOUNTER — Other Ambulatory Visit: Payer: Self-pay

## 2022-11-26 MED ORDER — METOPROLOL SUCCINATE ER 25 MG PO TB24
25.0000 mg | ORAL_TABLET | Freq: Every day | ORAL | 1 refills | Status: DC
  Filled 2022-11-26: qty 90, 90d supply, fill #0
  Filled 2023-01-30 – 2023-03-15 (×3): qty 90, 90d supply, fill #1

## 2022-11-27 ENCOUNTER — Other Ambulatory Visit (HOSPITAL_COMMUNITY): Payer: Self-pay

## 2022-12-01 DIAGNOSIS — M19072 Primary osteoarthritis, left ankle and foot: Secondary | ICD-10-CM | POA: Diagnosis not present

## 2022-12-01 DIAGNOSIS — M6702 Short Achilles tendon (acquired), left ankle: Secondary | ICD-10-CM | POA: Diagnosis not present

## 2022-12-01 DIAGNOSIS — Z4889 Encounter for other specified surgical aftercare: Secondary | ICD-10-CM | POA: Diagnosis not present

## 2022-12-15 DIAGNOSIS — M25572 Pain in left ankle and joints of left foot: Secondary | ICD-10-CM | POA: Diagnosis not present

## 2022-12-15 DIAGNOSIS — M6281 Muscle weakness (generalized): Secondary | ICD-10-CM | POA: Diagnosis not present

## 2022-12-18 ENCOUNTER — Encounter: Payer: Self-pay | Admitting: Adult Health

## 2022-12-18 ENCOUNTER — Ambulatory Visit (INDEPENDENT_AMBULATORY_CARE_PROVIDER_SITE_OTHER): Payer: 59 | Admitting: Adult Health

## 2022-12-18 VITALS — BP 120/73 | HR 68 | Ht 68.0 in | Wt 189.0 lb

## 2022-12-18 DIAGNOSIS — G43101 Migraine with aura, not intractable, with status migrainosus: Secondary | ICD-10-CM | POA: Diagnosis not present

## 2022-12-18 DIAGNOSIS — G4733 Obstructive sleep apnea (adult) (pediatric): Secondary | ICD-10-CM | POA: Diagnosis not present

## 2022-12-18 NOTE — Progress Notes (Signed)
PATIENT: [name] DOB: [birth year]  REASON FOR VISIT: follow up HISTORY FROM: patient  Chief Complaint  Patient presents with   RM 4    Patient here alone for headache f/u. She has still been having them. She has one now. She had her foot fused 13 weeks ago so she was in bed, etc, and had a lot of headaches. Naratriptan seems to work the best. She doesn't use her CPAP. She states she hasn't used it in about 6 months. She doesn't sleep well with it and she wakes up multiple times a night, struggles with dry mouth, etc and cannot get used to it. ESS 2. Had two bad migraine episodes, was forgetful, got lost on the way to work.      HISTORY OF PRESENT ILLNESS: Today 12/18/22:  [name] is a 62 y.o. female with a history of Migraine headaches and OSA. Returns today for follow-up.   Struggle with using the CPAP. Doesn't sleep well with it. Wakes her up at night.  Continues to use trazodone.  She is going to try restarting the CPAP.  Migraines: has atleast 3-4 migraines a month. Phonophobia. Migraine usually starts with visual changes. She had 2 episodes where she temporaily got lost. Naratriptan works well- resolves in 30 minutes. No longer doing Emgality due to site reaction. Continues to take gabapentin 300 mg twice a day.   Has been out of work due to foot surgery. Will start back at work next week.   04/04/2021: Patient is here for follow-up of transient global amnesia and new onset of headaches.  Patient was started on gabapentin which she feels was extremely helpful for her headaches however it is making her gain weight, and also help with her hot flashes.  She is also on [name]. She thinks the gabapentin has helped her headaches. She desn't think it was the [name]. She was taking it '300mg'$  bid and '600mg'$  qhs. Now she is taking it more at night and back to taking '300mg'$  at bedtime.  She was doing extremely well with her headaches however over the last week there has been  some stress and she has recurrent headaches.  I encouraged her to continue the Emgality and we can follow-up again in 3 months.  She can continue the gabapentin at a lower dose.   Patient had a very strange episode where she ordered from [facility] at [address] in the morning and doesn't remember doing it. She does take [place] but this has never happened to her.  We discussed could be a medication effect, I find it highly unlikely that she could sign onto [place] and in order items if she were having a seizure even if it were complex partial, but cannot rule it out.   - we will continue the ajovy, take benadryl prior for any reactions.  If insurance prefers [place] we can order that instead.   - she is taking '300mg'$  qam and 300qpm helping with the hot flashes but she is gaining weight so she is going to see obgyn to help with hot flashes. She thinks gabapentin helped her headaches unfortunately.    - she wake often in the middle of the night, she is extremely fatigued during the day, we could send to dr dohmeier and during the sleep study monitor eeg       01/07/2021: She has had a headache since she left. It is not getting better. She is having the migraines daily, the brain  feels bruised, in the left eye, it may seem like it goes away but it comes back with any stress, light makes it worse.  She had the abscess found in the left side of her jaw, it is now improved but she continues to have headaches.  She did have a migraine cocktail in our office and that helped significantly but again can still continues to have headaches.  She tried the Axert, she tried the Maxalt, she tried and Nurtec, and still having headaches.  We discussed at this time it would probably be best for her to start a preventative, I gave her [name] samples, we injected the first 1 in the office, we will check back in with her in 10 weeks and see how she is feeling.   12/12/20: [name] is a 62 year old female with a history of altered  awareness, migraine headache. She returns today still with a daily headache.  In the past Dr. Lavell Anchors has tried a prednisone Dosepak, indomethacin, axert, nurtec and migraine infusion but this has not broken her headache cycle.  She does state that with the migraine infusion she did go a day and a half without headache.  In the last 2 weeks she has been diagnosed with an abscessed tooth.  She was originally placed on penicillin and had a tooth removed today.  She describes her headache today as a dull headache 2 out of 10 on the pain scale.  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  HISTORY (copied from Dr. Cathren Laine note)  [name] is a 62 y.o. female here as requested by  Shelly Coss MD for migraines. PMHx von Willebrand's disease, ray nodes, peripheral neuropathy, migraine, insomnia, IBS, hyperlipidemia, depression, back pain, arthritis, anxiety.  I reviewed Shelly Coss MDs discharge summary from November 15, 2020: Patient presented to the emergency room secondary to transient left-sided weakness, dizziness and visual defects, by the time physician saw her her symptoms were completely resolved and she felt back to normal, symptoms were present for several hours, she was last seen well when waking up in the morning, she expressed having episodes after showering when her leg gave way, while driving to work she experienced diplopia and triple vision, Rx to experienced a period of time according to her 20 minutes or so of transient amnesia, CT scan of the head was without acute abnormality, negative MRI, normal blood work, TIA/stroke work-up was negative, her symptoms were thought to be secondary to status migrainosus, she was given multiple doses of migraine cocktail during hospitalization, she was discharged on Maxalt and valproic acid.   She feels "spacey", she still has a headache, has had it since she was seen at the ER, she was driving and  she says she had triple vision and closed one eye and it resolved, she has had a headache for 2 months and she has had a headache since her friend passed away thanksgiving, she cared for her friend's mother who dies in hospice (she gave her morphine throught the night) she didn't sleep for several days helping out and a few days later had the incident driving and triple vision, she had a headache at the time (had been ongoing for months in the setting of stress), she wakes up with migraine, better standing, waxes and wanes, prior to a few months ago she would get a headache once a month and caffeine and analgesic may help, but she would start with  a visual problem, looks like a 70s wavy lines in both eyes, may not progress to a headache if she catches it on time. But this has been different. She lost 3 hours, she was at work, she was told she was appropriate ut doesn't remember 3 hours. Not doing well on Depakote. She is taking the maxalt 4-5 times worried the headache will come back. Migraine cocktail inpatient helped, she slept the first night int he hospital, she had severe light sensitivity, she had benadryl, compazine, decadron she felt better prior to going home but the headache has returned but not as severe since she came home. maxalt is helping.    Reviewed notes, labs and imaging from outside physicians, which showed:   From a thorough review of records, medications tried that can be used in migraine management includes Tylenol, aspirin, Fioricet, Flexeril, Decadron injections, Benadryl injections, Depakote, ibuprofen, ketorolac injections, magnesium, Depo-Medrol injection, Reglan injections, metoprolol, Zofran tablets and injections, Compazine injections, rizatriptan, Effexor.     B12 373, TSH 2.6, hemoglobin A1c 5.2, LDL 134, CBC normal, BMP unremarkable, HIV negative.     MRI brain 11/13/2020: FINDINGS: Brain: Ventricle size and cerebral volume within normal limits. Several small  hyperintensities in the left frontal lobe. Brainstem and cerebellum normal.   Negative for acute infarct.  Negative for hemorrhage or mass.   Vascular: Normal arterial flow voids.   Skull and upper cervical spine: Negative   Sinuses/Orbits: Paranasal sinuses clear negative orbit   Other: None   IMPRESSION: No acute abnormality. Mild white matter changes most likely due to chronic ischemia.   CTA H&N: IMPRESSION: 11/13/2020 No large vessel occlusion or hemodynamically significant proximal stenosis in the head or neck.   IMPRESSION: This study is within normal limits. No seizures or epileptiform discharges were seen throughout the recording.    REVIEW OF SYSTEMS: Out of a complete 14 system review of symptoms, the patient complains only of the following symptoms, and all other reviewed systems are negative. See HPI  ALLERGIES: Allergies  Allergen Reactions   Ciprofloxacin     Tendonitis    Levaquin [Levofloxacin In D5w] Other (See Comments)    Tendonitis in shoulder, severe yeast infections   Sulfa Antibiotics Other (See Comments)    Unknown reaction - childhood allergy    HOME MEDICATIONS: Outpatient Medications Prior to Visit  Medication Sig Dispense Refill   cholecalciferol (VITAMIN D3) 25 MCG (1000 UNIT) tablet Take 1,000 Units by mouth daily.     clobetasol ointment (TEMOVATE) 0.05 % Apply 1 thin Application topically to affected areas 2 (two) times daily. 30 g 0   conjugated estrogens (PREMARIN) vaginal cream Insert 0.5 g vaginally nightly for 2 weeks then use 2-3 times a week 30 g 2   Cyanocobalamin (VITAMIN B12) 1000 MCG TBCR Take 1 tablet by mouth daily.     docusate sodium (COLACE) 100 MG capsule Take 1 capsule (100 mg total) by mouth 2 (two) times daily. While taking narcotic pain medicine. 30 capsule 0   fluconazole (DIFLUCAN) 150 MG tablet Take 1 tablet (150 mg total) by mouth as needed candidiasis flareup 15 tablet 1   gabapentin (NEURONTIN) 300 MG capsule  Take 1 capsule (300 mg total) by mouth 2 (two) times daily. 60 capsule 8   Galcanezumab-gnlm (EMGALITY) 120 MG/ML SOAJ Inject 120 mg into the skin every 30 (thirty) days. 1 mL 11   HYDROcodone-acetaminophen (NORCO/VICODIN) 5-325 MG tablet Take 1 tablet by mouth every 4 (four) hours as needed. 30 tablet 0  ipratropium (ATROVENT) 0.06 % nasal spray Place 2 sprays into both nostrils as needed for rhinitis.     meloxicam (MOBIC) 15 MG tablet Take 1 tablet (15 mg total) by mouth daily. 30 tablet 1   meloxicam (MOBIC) 7.5 MG tablet Take 1 tablet by mouth twice a day for 2 weeks and then as needed 60 tablet 0   meloxicam (MOBIC) 7.5 MG tablet Take 1 tablet (7.5 mg total) by mouth 2 (two) times daily for 2 weeks then take as needed. 60 tablet 0   methocarbamol (ROBAXIN) 500 MG tablet Take 1-2 tablets (500-1,000 mg total) by mouth 4 (four) times daily as needed for muscle spasms 40 tablet 0   metoprolol succinate (TOPROL-XL) 25 MG 24 hr tablet Take 1 tablet (25 mg total) by mouth daily. 90 tablet 2   metoprolol succinate (TOPROL-XL) 25 MG 24 hr tablet Take 1 tablet (25 mg total) by mouth daily. 90 tablet 1   naratriptan (AMERGE) 2.5 MG tablet Take one tablet by mouth at onset of headache; if returns or does not resolve, may repeat after 2-4 hours; do not exceed two tabs in 24 hours. 9 tablet 3   nystatin cream (MYCOSTATIN) Apply topically 2 times a day 60 g 11   Omega 3 340 MG CPDR Take 2 capsules by mouth daily.     ondansetron (ZOFRAN) 4 MG tablet Take 1 tablet by mouth  every 4 hours as needed. 30 tablet 0   Probiotic Product (PROBIOTIC PO) Take 1 capsule by mouth daily.     promethazine (PHENERGAN) 25 MG tablet Take 1 tablet (25 mg total) by mouth every 4 (four) hours as needed for nausea 40 tablet 0   rizatriptan (MAXALT-MLT) 10 MG disintegrating tablet DISSOLVE 1 TABLET BY MOUTH AS NEEDED FOR MIGRAINE. MAY REPEAT IN 2 HOURS IF NEEDED 9 tablet 0   saccharomyces boulardii (FLORASTOR) 250 MG capsule Take  by mouth. 2 per day     senna (SENOKOT) 8.6 MG TABS tablet Take 2 tablets (17.2 mg total) by mouth 2 (two) times daily. 30 tablet 0   traZODone (DESYREL) 50 MG tablet Take 1 - 2 tablets by mouth at bedtime. 60 tablet 5   vortioxetine HBr (TRINTELLIX) 10 MG TABS tablet Take 1 tablet (10 mg total) by mouth daily. 90 tablet 3   Zinc 50 MG TABS Take 1 tablet by mouth every Monday, Wednesday, and Friday.     No facility-administered medications prior to visit.    PAST MEDICAL HISTORY: Past Medical History:  Diagnosis Date   Anxiety    Arthritis    Back pain    Chronic rhinitis    Complication of anesthesia    Depression    Dysrhythmia    hx svt- pcp follows   Family history of colonic polyps    Hepatic hemangioma    Hyperlipidemia    IBS (irritable bowel syndrome)    Insomnia    Migraine    Osteoarthritis, foot, localized    left   Perimenopause    Peripheral neuropathy    PONV (postoperative nausea and vomiting)    Raynauds syndrome    Seasonal allergies    Von Willebrand's disease ([place])    bruises easily    PAST SURGICAL HISTORY: Past Surgical History:  Procedure Laterality Date   ARTHROTOMY Left 09/18/2022   Procedure: Fourth and Fifth Tarsal Metatarsal Phalangeal Arthrotomy and Debridement; Protein Rich Plasma injection to 4-5 Tarsalmetarsal joint;  Surgeon: Wylene Simmer, MD;  Location: [facility];  Service: Orthopedics;  Laterality: Left;   BONE EXOSTOSIS EXCISION Left 02/24/2019   Procedure: Dorsal Exostectomy;  Surgeon: Wylene Simmer, MD;  Location: [place];  Service: Orthopedics;  Laterality: Left;   CESAREAN SECTION     FOOT ARTHRODESIS Left 09/18/2022   Procedure: Arthrodesis Tarsal Metatarsal Joint 1-3;  Surgeon: Wylene Simmer, MD;  Location: [place];  Service: Orthopedics;  Laterality: Left;   GASTROCNEMIUS RECESSION Left 09/18/2022   Procedure: Gastrocnemius Recession;  Surgeon: Wylene Simmer, MD;  Location: [place];  Service: Orthopedics;  Laterality: Left;   laparoscopy removal of right ovarian cyst     left knee arthroscopy     NOVASURE ABLATION  12/2005   pylonidal cyst removal     SEPTOPLASTY     TOOTH EXTRACTION  12/2020    FAMILY HISTORY: Family History  Problem Relation Age of Onset   Hypertension Mother    Arthritis Mother    Hypertension Father    Heart disease Father    Lupus Sister    Other Sister        mythensia gravis    SOCIAL HISTORY: Social History   Socioeconomic History   Marital status: Married    Spouse name: Not on file   Number of children: Not on file   Years of education: Not on file   Highest education level: Not on file  Occupational History   Not on file  Tobacco Use   Smoking status: Former    Years: 10.00    Types: Cigarettes   Smokeless tobacco: Never  Vaping Use   Vaping Use: Never used  Substance and Sexual Activity   Alcohol use: Yes    Alcohol/week: 1.0 - 2.0 standard drink of alcohol    Types: 1 - 2 Standard drinks or equivalent per week    Comment: wine or vodka; was previously 1-2 per night, but liver enzymes up so she has cut back   Drug use: No   Sexual activity: Yes    Partners: Male    Comment: husband vasectomy-1st intercourse 25 yo-5 partners  Other Topics Concern   Not on file  Social History Narrative   Lives at home with husband and 2 children who are freshman in college   Right handed   Caffeine: maybe 1 cup/day   Social Determinants of Radio broadcast assistant Strain: Not on file  Food Insecurity: Not on file  Transportation Needs: Not on file  Physical Activity: Not on file  Stress: Not on file  Social Connections: Not on file  Intimate Partner Violence: Not on file      PHYSICAL EXAM  Vitals:   12/18/22 1044  BP: 120/73  Pulse: 68  Weight: 189 lb (85.7 kg)  Height: '5\' 8"'$  (1.727 m)    Body mass index is 28.74 kg/m.  Generalized: Well developed, in no acute distress    Neurological examination  Mentation: Alert oriented to time, place, history taking. Follows all commands speech and language fluent Cranial nerve II-XII: Pupils were equal round reactive to light. Extraocular movements were full, visual field were full on confrontational test. Facial sensation and strength were normal. Head turning and shoulder shrug  were normal and symmetric. Motor: The motor testing reveals 5 over 5 strength of all 4 extremities. Good symmetric motor tone is noted throughout.  Sensory: Sensory testing is intact to soft touch on all 4 extremities. No evidence of extinction is noted.  Coordination: Cerebellar testing  reveals good finger-nose-finger and heel-to-shin bilaterally.  Gait and station: Gait is normal.    DIAGNOSTIC DATA (LABS, IMAGING, TESTING) - I reviewed patient records, labs, notes, testing and imaging myself where available.  Lab Results  Component Value Date   WBC 5.9 11/13/2020   HGB 13.1 11/13/2020   HCT 40.3 11/13/2020   MCV 96.0 11/13/2020   PLT 182 11/13/2020      Component Value Date/Time   NA 137 11/13/2020 0811   NA 138 12/09/2019 1052   K 4.1 11/13/2020 0811   CL 102 11/13/2020 0811   CO2 27 11/13/2020 0811   GLUCOSE 105 (H) 11/13/2020 0811   BUN 15 11/13/2020 0811   BUN 15 12/09/2019 1052   CREATININE 0.68 11/13/2020 0811   CREATININE 0.82 08/22/2014 1130   CALCIUM 8.9 11/13/2020 0811   PROT 6.4 08/22/2014 1130   ALBUMIN 4.1 08/22/2014 1130   AST 20 08/22/2014 1130   ALT 23 08/22/2014 1130   ALKPHOS 57 08/22/2014 1130   BILITOT 0.3 08/22/2014 1130   GFRNONAA >60 11/13/2020 0811   GFRAA 79 12/09/2019 1052   Lab Results  Component Value Date   CHOL 215 (H) 11/14/2020   HDL 58 11/14/2020   LDLCALC 134 (H) 11/14/2020   TRIG 116 11/14/2020   CHOLHDL 3.7 11/14/2020   Lab Results  Component Value Date   HGBA1C 5.2 11/14/2020   Lab Results  Component Value Date   VITAMINB12 373 11/13/2020   Lab Results  Component  Value Date   TSH 2.602 11/13/2020      ASSESSMENT AND PLAN 62 y.o. year old female  has a past medical history of Anxiety, Arthritis, Back pain, Chronic rhinitis, Complication of anesthesia, Depression, Dysrhythmia, Family history of colonic polyps, Hepatic hemangioma, Hyperlipidemia, IBS (irritable bowel syndrome), Insomnia, Migraine, Osteoarthritis, foot, localized, Perimenopause, Peripheral neuropathy, PONV (postoperative nausea and vomiting), Raynauds syndrome, Seasonal allergies, and Von Willebrand's disease ([place]). here with:  1.  Migraine headaches  Patient will continue naratriptan for abortive therapy for now. Advised if headache frequency increases we may need to consider a preventative medication such as Qulipta  2.  Obstructive sleep apnea on CPAP  Patient plans to restart her CPAP therapy Encouraged her to use the machine nightly and greater than 4 hours each night  Follow-up in 6 months or sooner if needed    Ward Givens, MSN, NP-C 12/18/2022, 10:25 AM [facility] [address] [place], [place] [address] [phone]

## 2022-12-23 DIAGNOSIS — M6281 Muscle weakness (generalized): Secondary | ICD-10-CM | POA: Diagnosis not present

## 2022-12-23 DIAGNOSIS — M25572 Pain in left ankle and joints of left foot: Secondary | ICD-10-CM | POA: Diagnosis not present

## 2022-12-29 DIAGNOSIS — M25572 Pain in left ankle and joints of left foot: Secondary | ICD-10-CM | POA: Diagnosis not present

## 2022-12-29 DIAGNOSIS — M6281 Muscle weakness (generalized): Secondary | ICD-10-CM | POA: Diagnosis not present

## 2022-12-31 DIAGNOSIS — M19072 Primary osteoarthritis, left ankle and foot: Secondary | ICD-10-CM | POA: Diagnosis not present

## 2023-01-03 ENCOUNTER — Other Ambulatory Visit: Payer: Self-pay

## 2023-01-12 ENCOUNTER — Encounter: Payer: Self-pay | Admitting: Adult Health

## 2023-01-13 ENCOUNTER — Other Ambulatory Visit: Payer: Self-pay

## 2023-01-13 MED ORDER — QULIPTA 60 MG PO TABS
60.0000 mg | ORAL_TABLET | Freq: Every day | ORAL | 5 refills | Status: DC
  Filled 2023-01-13: qty 30, 30d supply, fill #0

## 2023-01-13 NOTE — Addendum Note (Signed)
Addended by: Gildardo Griffes on: 01/13/2023 08:27 AM   Modules accepted: Orders

## 2023-01-13 NOTE — Telephone Encounter (Signed)
Qulipta 60 mg PO daily sent to [facility]

## 2023-01-14 ENCOUNTER — Other Ambulatory Visit (HOSPITAL_COMMUNITY): Payer: Self-pay

## 2023-01-15 ENCOUNTER — Telehealth: Payer: Self-pay | Admitting: *Deleted

## 2023-01-15 NOTE — Telephone Encounter (Signed)
Qulipta PA needed

## 2023-01-16 ENCOUNTER — Other Ambulatory Visit (HOSPITAL_COMMUNITY): Payer: Self-pay

## 2023-01-16 NOTE — Telephone Encounter (Signed)
Pharmacy Patient Advocate Encounter  Prior Authorization for Qulipta 60MG  Tablet has been approved by [employer] (ins).    PA # PA Case ID: [id number] Effective dates: 01/16/2023 through 07/18/2023

## 2023-01-21 DIAGNOSIS — F411 Generalized anxiety disorder: Secondary | ICD-10-CM | POA: Diagnosis not present

## 2023-01-30 ENCOUNTER — Other Ambulatory Visit: Payer: Self-pay | Admitting: Neurology

## 2023-01-30 ENCOUNTER — Other Ambulatory Visit: Payer: Self-pay

## 2023-01-30 ENCOUNTER — Other Ambulatory Visit (HOSPITAL_COMMUNITY): Payer: Self-pay

## 2023-01-30 MED ORDER — NYSTATIN 100000 UNIT/GM EX CREA
1.0000 | TOPICAL_CREAM | Freq: Two times a day (BID) | CUTANEOUS | 1 refills | Status: AC
  Filled 2023-01-30: qty 60, 30d supply, fill #0

## 2023-02-03 ENCOUNTER — Other Ambulatory Visit: Payer: Self-pay

## 2023-02-03 ENCOUNTER — Other Ambulatory Visit (HOSPITAL_COMMUNITY): Payer: Self-pay

## 2023-02-03 MED ORDER — TRAZODONE HCL 50 MG PO TABS
50.0000 mg | ORAL_TABLET | Freq: Every day | ORAL | 5 refills | Status: DC
  Filled 2023-02-03: qty 60, 30d supply, fill #0
  Filled 2023-05-16: qty 60, 30d supply, fill #1
  Filled 2023-07-17: qty 60, 30d supply, fill #2
  Filled 2023-08-23 – 2023-08-24 (×2): qty 60, 30d supply, fill #3
  Filled 2023-10-11: qty 60, 30d supply, fill #4
  Filled 2023-11-17: qty 60, 30d supply, fill #5

## 2023-02-03 NOTE — Telephone Encounter (Signed)
[place] drug registry checked last fill ambien  #90 03-31-2022 per Dr. Vickey Huger.

## 2023-02-04 ENCOUNTER — Encounter (HOSPITAL_COMMUNITY): Payer: Self-pay

## 2023-02-04 ENCOUNTER — Other Ambulatory Visit (HOSPITAL_COMMUNITY): Payer: Self-pay

## 2023-02-04 DIAGNOSIS — Z01419 Encounter for gynecological examination (general) (routine) without abnormal findings: Secondary | ICD-10-CM | POA: Diagnosis not present

## 2023-02-04 DIAGNOSIS — N952 Postmenopausal atrophic vaginitis: Secondary | ICD-10-CM | POA: Diagnosis not present

## 2023-02-04 DIAGNOSIS — Z6828 Body mass index (BMI) 28.0-28.9, adult: Secondary | ICD-10-CM | POA: Diagnosis not present

## 2023-02-04 MED ORDER — ESTRADIOL 10 MCG VA TABS
10.0000 ug | ORAL_TABLET | VAGINAL | 3 refills | Status: DC
  Filled 2023-02-04: qty 16, 37d supply, fill #0

## 2023-02-05 ENCOUNTER — Other Ambulatory Visit (HOSPITAL_COMMUNITY): Payer: Self-pay

## 2023-02-05 DIAGNOSIS — F411 Generalized anxiety disorder: Secondary | ICD-10-CM | POA: Diagnosis not present

## 2023-02-05 MED ORDER — ZOLPIDEM TARTRATE 10 MG PO TABS
10.0000 mg | ORAL_TABLET | Freq: Every day | ORAL | 0 refills | Status: DC
  Filled 2023-02-05: qty 30, 30d supply, fill #0

## 2023-02-05 NOTE — Addendum Note (Signed)
Addended by: Enedina Finner on: 02/05/2023 08:10 AM   Modules accepted: Orders

## 2023-02-05 NOTE — Telephone Encounter (Signed)
Reviewed med list. Dr. Vickey Huger refilled Remus Loffler 10 mg in 2023. I have sent in a refill- 30 tabs no refills.

## 2023-02-10 ENCOUNTER — Other Ambulatory Visit: Payer: Self-pay

## 2023-02-11 ENCOUNTER — Other Ambulatory Visit (HOSPITAL_COMMUNITY): Payer: Self-pay

## 2023-02-11 DIAGNOSIS — T8484XA Pain due to internal orthopedic prosthetic devices, implants and grafts, initial encounter: Secondary | ICD-10-CM | POA: Diagnosis not present

## 2023-02-11 DIAGNOSIS — M19072 Primary osteoarthritis, left ankle and foot: Secondary | ICD-10-CM | POA: Diagnosis not present

## 2023-02-11 DIAGNOSIS — T849XXA Unspecified complication of internal orthopedic prosthetic device, implant and graft, initial encounter: Secondary | ICD-10-CM | POA: Insufficient documentation

## 2023-02-11 MED ORDER — MELOXICAM 7.5 MG PO TABS
7.5000 mg | ORAL_TABLET | Freq: Two times a day (BID) | ORAL | 0 refills | Status: AC
  Filled 2023-02-11: qty 60, 30d supply, fill #0

## 2023-03-15 ENCOUNTER — Other Ambulatory Visit: Payer: Self-pay | Admitting: Neurology

## 2023-03-16 ENCOUNTER — Other Ambulatory Visit: Payer: Self-pay

## 2023-03-17 DIAGNOSIS — M1712 Unilateral primary osteoarthritis, left knee: Secondary | ICD-10-CM | POA: Diagnosis not present

## 2023-03-18 ENCOUNTER — Encounter: Payer: Self-pay | Admitting: Gastroenterology

## 2023-03-18 ENCOUNTER — Other Ambulatory Visit: Payer: Self-pay

## 2023-03-18 DIAGNOSIS — F411 Generalized anxiety disorder: Secondary | ICD-10-CM | POA: Diagnosis not present

## 2023-03-18 MED ORDER — GABAPENTIN 300 MG PO CAPS
300.0000 mg | ORAL_CAPSULE | Freq: Two times a day (BID) | ORAL | 8 refills | Status: DC
  Filled 2023-03-18: qty 60, 30d supply, fill #0
  Filled 2023-04-13: qty 60, 30d supply, fill #1
  Filled 2023-05-16: qty 60, 30d supply, fill #2
  Filled 2023-06-13: qty 60, 30d supply, fill #3
  Filled 2023-07-17: qty 60, 30d supply, fill #4
  Filled 2023-08-23 – 2023-08-24 (×2): qty 60, 30d supply, fill #5
  Filled 2023-09-15: qty 60, 30d supply, fill #6

## 2023-03-19 ENCOUNTER — Other Ambulatory Visit: Payer: Self-pay

## 2023-03-31 DIAGNOSIS — F411 Generalized anxiety disorder: Secondary | ICD-10-CM | POA: Diagnosis not present

## 2023-04-13 DIAGNOSIS — T8484XA Pain due to internal orthopedic prosthetic devices, implants and grafts, initial encounter: Secondary | ICD-10-CM | POA: Diagnosis not present

## 2023-04-13 DIAGNOSIS — M19072 Primary osteoarthritis, left ankle and foot: Secondary | ICD-10-CM | POA: Diagnosis not present

## 2023-04-14 ENCOUNTER — Other Ambulatory Visit: Payer: Self-pay

## 2023-04-28 DIAGNOSIS — F411 Generalized anxiety disorder: Secondary | ICD-10-CM | POA: Diagnosis not present

## 2023-04-28 DIAGNOSIS — M1712 Unilateral primary osteoarthritis, left knee: Secondary | ICD-10-CM | POA: Diagnosis not present

## 2023-05-06 DIAGNOSIS — M1712 Unilateral primary osteoarthritis, left knee: Secondary | ICD-10-CM | POA: Diagnosis not present

## 2023-05-13 DIAGNOSIS — M1712 Unilateral primary osteoarthritis, left knee: Secondary | ICD-10-CM | POA: Diagnosis not present

## 2023-05-16 ENCOUNTER — Other Ambulatory Visit: Payer: Self-pay | Admitting: Adult Health

## 2023-05-16 ENCOUNTER — Other Ambulatory Visit (HOSPITAL_COMMUNITY): Payer: Self-pay

## 2023-05-18 ENCOUNTER — Other Ambulatory Visit: Payer: Self-pay

## 2023-05-19 ENCOUNTER — Other Ambulatory Visit (HOSPITAL_COMMUNITY): Payer: Self-pay

## 2023-05-21 ENCOUNTER — Other Ambulatory Visit (HOSPITAL_COMMUNITY): Payer: Self-pay

## 2023-05-21 ENCOUNTER — Other Ambulatory Visit: Payer: Self-pay

## 2023-05-21 MED ORDER — ZOLPIDEM TARTRATE 10 MG PO TABS
10.0000 mg | ORAL_TABLET | Freq: Every day | ORAL | 0 refills | Status: DC
  Filled 2023-05-21: qty 30, 30d supply, fill #0

## 2023-05-21 NOTE — Telephone Encounter (Signed)
  Last refill 04.25.2024, Last OV was 03.07.2024. Refill due but please also see last phone note from 04.25.24 as well as messages from this morning. Please review and sign if agreeable

## 2023-05-21 NOTE — Addendum Note (Signed)
Addended by: Deatra James on: 05/21/2023 10:46 AM   Modules accepted: Orders

## 2023-06-04 ENCOUNTER — Ambulatory Visit: Payer: 59 | Admitting: Gastroenterology

## 2023-06-04 ENCOUNTER — Encounter: Payer: Self-pay | Admitting: Gastroenterology

## 2023-06-04 ENCOUNTER — Other Ambulatory Visit: Payer: Self-pay

## 2023-06-04 VITALS — BP 112/70 | HR 69 | Ht 68.0 in | Wt 177.1 lb

## 2023-06-04 DIAGNOSIS — R1031 Right lower quadrant pain: Secondary | ICD-10-CM

## 2023-06-04 DIAGNOSIS — R194 Change in bowel habit: Secondary | ICD-10-CM | POA: Diagnosis not present

## 2023-06-04 MED ORDER — NA SULFATE-K SULFATE-MG SULF 17.5-3.13-1.6 GM/177ML PO SOLN
1.0000 | Freq: Once | ORAL | 0 refills | Status: AC
  Filled 2023-06-04: qty 354, 1d supply, fill #0

## 2023-06-04 NOTE — Patient Instructions (Signed)
You have been scheduled for a colonoscopy. Please follow written instructions given to you at your visit today.   Please pick up your prep supplies at the pharmacy within the next 1-3 days.  If you use inhalers (even only as needed), please bring them with you on the day of your procedure.  DO NOT TAKE 7 DAYS PRIOR TO TEST- Trulicity (dulaglutide) Ozempic, Wegovy (semaglutide) Mounjaro (tirzepatide) Bydureon Bcise (exanatide extended release)  DO NOT TAKE 1 DAY PRIOR TO YOUR TEST Rybelsus (semaglutide) Adlyxin (lixisenatide) Victoza (liraglutide) Byetta (exanatide) _______________________________________________________  If your blood pressure at your visit was 140/90 or greater, please contact your primary care physician to follow up on this.  _______________________________________________________  If you are age 60 or older, your body mass index should be between 23-30. Your Body mass index is 26.93 kg/m. If this is out of the aforementioned range listed, please consider follow up with your Primary Care Provider.  If you are age 61 or younger, your body mass index should be between 19-25. Your Body mass index is 26.93 kg/m. If this is out of the aformentioned range listed, please consider follow up with your Primary Care Provider.   ________________________________________________________  The [place] [facility] providers would like to encourage you to use [facility] to communicate with providers for non-urgent requests or questions.  Due to long hold times on the telephone, sending your provider a message by [facility] may be a faster and more efficient way to get a response.  Please allow 48 business hours for a response.  Please remember that this is for non-urgent requests.  _______________________________________________________

## 2023-06-04 NOTE — Progress Notes (Signed)
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  She had previous colonoscopy with Dr. Evette Cristal at [facility] in February 2017 at which time she was found to have a small polyp that was hyperplastic on pathology.  Recall recommended a 10-year interval by Dr. Christella Hartigan as she wanted to transfer her care to him.  That was discussed in 2022, but patient has actually never been seen in our office until now.  States that she has a longstanding diagnosis of IBS continues to struggle with constipation most of her life.  Now for about the past 5 years she has been dealing mostly with diarrhea, however.  She also has frequent right-sided abdominal pain that can be quite miserable for extensive periods of time.  She just thought it was her normal for her.  She denies any rectal bleeding.  She says that she is actually planning to retire possibly by the end of the year and wanted to see if it is possible that she could proceed with colonoscopy for insurance changes, etc.   Past Medical History:  Diagnosis Date   Anxiety    Arthritis    Back pain    Chronic rhinitis    Complication of anesthesia    Depression    Dysrhythmia    hx svt- pcp follows   Family history of colonic polyps    Hepatic hemangioma    Hyperlipidemia    IBS (irritable bowel syndrome)    Insomnia    Migraine    Osteoarthritis, foot, localized    left   Perimenopause    Peripheral neuropathy    PONV (postoperative nausea and vomiting)    Raynauds syndrome    Seasonal allergies    Von Willebrand's disease (HCC)    bruises easily   Past Surgical History:  Procedure Laterality Date   ARTHROTOMY Left 09/18/2022   Procedure: Fourth and Fifth Tarsal Metatarsal Phalangeal Arthrotomy and Debridement; Protein Rich Plasma injection to 4-5 Tarsalmetarsal joint;  Surgeon: Toni Arthurs, MD;  Location: [facility];  Service: Orthopedics;  Laterality:  Left;   BONE EXOSTOSIS EXCISION Left 02/24/2019   Procedure: Dorsal Exostectomy;  Surgeon: Toni Arthurs, MD;  Location: [facility];  Service: Orthopedics;  Laterality: Left;   CESAREAN SECTION     FOOT ARTHRODESIS Left 09/18/2022   Procedure: Arthrodesis Tarsal Metatarsal Joint 1-3;  Surgeon: Toni Arthurs, MD;  Location: [facility];  Service: Orthopedics;  Laterality: Left;   GASTROCNEMIUS RECESSION Left 09/18/2022   Procedure: Gastrocnemius Recession;  Surgeon: Toni Arthurs, MD;  Location:  SURGERY CENTER;  Service: Orthopedics;  Laterality: Left;   laparoscopy removal of right ovarian cyst     left knee arthroscopy     NOVASURE ABLATION  12/2005   pylonidal cyst removal     SEPTOPLASTY     TOOTH EXTRACTION  12/2020    reports that she has quit smoking. Her smoking use included cigarettes. She has never used smokeless tobacco. She reports current alcohol use of about 10.0 - 15.0 standard drinks of alcohol per week. She reports that she does not use drugs. family history includes Arthritis in her mother; Colon polyps in her father; Heart disease in her father; Hypertension in her father and mother; Lupus in her sister; Other in her sister; Prostate cancer in her father; Stomach cancer in her maternal grandmother; Ulcerative colitis in her sister. Allergies  Allergen  Reactions   Ciprofloxacin     Tendonitis    Levaquin [Levofloxacin In D5w] Other (See Comments)    Tendonitis in shoulder, severe yeast infections   Sulfa Antibiotics Other (See Comments)    Unknown reaction - childhood allergy      Outpatient Encounter Medications as of 06/04/2023  Medication Sig   cholecalciferol (VITAMIN D3) 25 MCG (1000 UNIT) tablet Take 1,000 Units by mouth daily.   conjugated estrogens (PREMARIN) vaginal cream Insert 0.5 g vaginally nightly for 2 weeks then use 2-3 times a week (Patient taking differently: Place vaginally. As needed)   fluconazole (DIFLUCAN) 150 MG  tablet Take 1 tablet (150 mg total) by mouth as needed candidiasis flareup   gabapentin (NEURONTIN) 300 MG capsule Take 1 capsule (300 mg total) by mouth 2 (two) times daily.   naratriptan (AMERGE) 2.5 MG tablet Take one tablet by mouth at onset of headache; if returns or does not resolve, may repeat after 2-4 hours; do not exceed two tabs in 24 hours.   nystatin cream (MYCOSTATIN) Apply 1 Application topically 2 (two) times daily.   Probiotic Product (UP4 PROBIOTICS PO) Take by mouth.   traZODone (DESYREL) 50 MG tablet Take 1-2 tablets (50-100 mg total) by mouth at bedtime.   vortioxetine HBr (TRINTELLIX) 10 MG TABS tablet Take 1 tablet (10 mg total) by mouth daily.   zolpidem (AMBIEN) 10 MG tablet Take 1 tablet (10 mg total) by mouth at bedtime.   Atogepant (QULIPTA) 60 MG TABS Take 1 tablet (60 mg total) by mouth daily. (Patient not taking: Reported on 06/04/2023)   clobetasol ointment (TEMOVATE) 0.05 % Apply 1 thin Application topically to affected areas 2 (two) times daily. (Patient not taking: Reported on 06/04/2023)   Estradiol (YUVAFEM) 10 MCG TABS vaginal tablet Place 1 tablet (10 mcg total) vaginally 3 (three) times a week. (Patient not taking: Reported on 06/04/2023)   metoprolol succinate (TOPROL-XL) 25 MG 24 hr tablet Take 1 tablet (25 mg total) by mouth daily. (Patient not taking: Reported on 06/04/2023)   No facility-administered encounter medications on file as of 06/04/2023.     REVIEW OF SYSTEMS  : All other systems reviewed and negative except where noted in the History of Present Illness.   PHYSICAL EXAM: BP 112/70   Pulse 69   Ht 5\' 8"  (1.727 m)   Wt 177 lb 2 oz (80.3 kg)   LMP 11/14/2012   SpO2 96%   BMI 26.93 kg/m  General: Well developed white female in no acute distress Head: Normocephalic and atraumatic Eyes:  Sclerae anicteric, conjunctiva pink. Ears: Normal auditory acuity Lungs: Clear throughout to auscultation; no W/R/R. Heart: Regular rate and rhythm; no  M/R/G. Abdomen: Soft, non-distended.  BS present.  Mild diffuse TTP slightly greater in the RLQ. Rectal:  Will be done at the time of colonoscopy. Musculoskeletal: Symmetrical with no gross deformities  Skin: No lesions on visible extremities Extremities: No edema  Neurological: Alert oriented x 4, grossly non-focal Psychological:  Alert and cooperative. Normal mood and affect  ASSESSMENT AND PLAN: *62 year old female with longstanding diagnosis of IBS that used to be constipation.  Now, over the past several years she has struggled more with diarrhea.  She also reports very frequent right lower quadrant abdominal pain, but that has been present for years.  Last colonoscopy in February 2017 with a hyperplastic polyp so repeat was recommended in 10 years.  She is planning to retire this year.  She would like to proceed with colonoscopy  now if we think appropriate with her other symptoms, etc. as well.  Will schedule Dr. Leonides Schanz.  The risks, benefits, and alternatives to colonoscopy were discussed with the patient and she consents to proceed.   CC:  Pahwani, Kasandra Knudsen, MD

## 2023-06-04 NOTE — Progress Notes (Signed)
I agree with the assessment and plan as outlined by Ms. Zehr. 

## 2023-06-10 ENCOUNTER — Encounter: Payer: Self-pay | Admitting: Internal Medicine

## 2023-06-13 ENCOUNTER — Other Ambulatory Visit (HOSPITAL_COMMUNITY): Payer: Self-pay

## 2023-06-21 ENCOUNTER — Other Ambulatory Visit (HOSPITAL_COMMUNITY): Payer: Self-pay

## 2023-06-22 ENCOUNTER — Encounter: Payer: Self-pay | Admitting: Internal Medicine

## 2023-06-22 ENCOUNTER — Other Ambulatory Visit: Payer: Self-pay

## 2023-06-22 ENCOUNTER — Other Ambulatory Visit (HOSPITAL_COMMUNITY): Payer: Self-pay

## 2023-06-22 ENCOUNTER — Ambulatory Visit (AMBULATORY_SURGERY_CENTER): Payer: 59 | Admitting: Internal Medicine

## 2023-06-22 VITALS — BP 115/76 | HR 67 | Temp 98.0°F | Resp 15 | Ht 68.0 in | Wt 177.0 lb

## 2023-06-22 DIAGNOSIS — K529 Noninfective gastroenteritis and colitis, unspecified: Secondary | ICD-10-CM | POA: Diagnosis not present

## 2023-06-22 DIAGNOSIS — D123 Benign neoplasm of transverse colon: Secondary | ICD-10-CM | POA: Diagnosis not present

## 2023-06-22 DIAGNOSIS — K633 Ulcer of intestine: Secondary | ICD-10-CM

## 2023-06-22 DIAGNOSIS — E669 Obesity, unspecified: Secondary | ICD-10-CM | POA: Diagnosis not present

## 2023-06-22 DIAGNOSIS — R1031 Right lower quadrant pain: Secondary | ICD-10-CM

## 2023-06-22 DIAGNOSIS — K635 Polyp of colon: Secondary | ICD-10-CM | POA: Diagnosis not present

## 2023-06-22 DIAGNOSIS — R194 Change in bowel habit: Secondary | ICD-10-CM

## 2023-06-22 MED ORDER — SODIUM CHLORIDE 0.9 % IV SOLN: 500.0000 mL | INTRAVENOUS | Status: DC

## 2023-06-22 MED ORDER — DICYCLOMINE HCL 10 MG PO CAPS
10.0000 mg | ORAL_CAPSULE | Freq: Three times a day (TID) | ORAL | 0 refills | Status: DC
  Filled 2023-06-22: qty 90, 23d supply, fill #0

## 2023-06-22 NOTE — Op Note (Signed)
[facility] Patient Name: [name] Procedure Date: 06/22/2023 1:02 PM MRN: [id number] Endoscopist: Madelyn Brunner Sandy , , [id number] Age: 62 Referring MD:  Date of Birth: 05/02/1961 Gender: Female Account #: [id number] Procedure:                Colonoscopy Indications:              Change in bowel habits, Diarrhea Medicines:                Monitored Anesthesia Care Procedure:                Pre-Anesthesia Assessment:                           - Prior to the procedure, a History and Physical                            was performed, and patient medications and                            allergies were reviewed. The patient's tolerance of                            previous anesthesia was also reviewed. The risks                            and benefits of the procedure and the sedation                            options and risks were discussed with the patient.                            All questions were answered, and informed consent                            was obtained. Prior Anticoagulants: The patient has                            taken no anticoagulant or antiplatelet agents. ASA                            Grade Assessment: II - A patient with mild systemic                            disease. After reviewing the risks and benefits,                            the patient was deemed in satisfactory condition to                            undergo the procedure.                           After obtaining informed consent, the colonoscope  was passed under direct vision. Throughout the                            procedure, the patient's blood pressure, pulse, and                            oxygen saturations were monitored continuously. The                            Olympus Scope [id number] was introduced through the                            anus and advanced to the the terminal ileum. The                            colonoscopy was  performed without difficulty. The                            patient tolerated the procedure well. The quality                            of the bowel preparation was good. The terminal                            ileum, ileocecal valve, appendiceal orifice, and                            rectum were photographed. Scope In: 1:29:27 PM Scope Out: 1:49:10 PM Scope Withdrawal Time: 0 hours 14 minutes 51 seconds  Total Procedure Duration: 0 hours 19 minutes 43 seconds  Findings:                 The terminal ileum contained multiple localized                            non-bleeding erosions. No stigmata of recent                            bleeding were seen. Biopsies were taken with a cold                            forceps for histology.                           Two sessile polyps were found in the transverse                            colon. The polyps were 3 to 10 mm in size. These                            polyps were removed with a cold snare. Resection                            and retrieval were complete.  Biopsies for histology were taken with a cold                            forceps from the entire colon for evaluation of                            microscopic colitis.                           Non-bleeding internal hemorrhoids were found during                            retroflexion. Complications:            No immediate complications. Estimated Blood Loss:     Estimated blood loss was minimal. Impression:               - Multiple erosions in the terminal ileum. Biopsied.                           - Two 3 to 10 mm polyps in the transverse colon,                            removed with a cold snare. Resected and retrieved.                           - Non-bleeding internal hemorrhoids.                           - Biopsies were taken with a cold forceps from the                            entire colon for evaluation of microscopic  colitis. Recommendation:           - Discharge patient to home (with escort).                           - Await pathology results.                           - Avoid NSAID use.                           - Bentyl 10 mg TID PRN for diarrhea and abdominal                            pain.                           - Follow up in GI clinic in 2-3 months.                           - The findings and recommendations were discussed                            with the patient. Dr Particia Lather "[place]"  Leonides Schanz,  06/22/2023 1:57:06 PM

## 2023-06-22 NOTE — Progress Notes (Signed)
GASTROENTEROLOGY PROCEDURE H&P NOTE   Primary Care Physician: Ollen Bowl, MD    Reason for Procedure:   Diarrhea, change in bowel habits  Plan:    Colonoscopy  Patient is appropriate for endoscopic procedure(s) in the ambulatory ([facility]) setting.  The nature of the procedure, as well as the risks, benefits, and alternatives were carefully and thoroughly reviewed with the patient. Ample time for discussion and questions allowed. The patient understood, was satisfied, and agreed to proceed.     HPI: [name] is a 62 y.o. female who presents for colonoscopy for evaluation of diarrhea and change in bowel habits.  Patient was most recently seen in the Gastroenterology Clinic on 06/04/23.  No interval change in medical history since that appointment. Please refer to that note for full details regarding GI history and clinical presentation.   Past Medical History:  Diagnosis Date   Anxiety    Arthritis    Back pain    Chronic rhinitis    Complication of anesthesia    Depression    Dysrhythmia    hx svt- pcp follows   Family history of colonic polyps    Hepatic hemangioma    Hyperlipidemia    IBS (irritable bowel syndrome)    Insomnia    Migraine    Osteoarthritis, foot, localized    left   Perimenopause    Peripheral neuropathy    PONV (postoperative nausea and vomiting)    Raynauds syndrome    Seasonal allergies    Von Willebrand's disease (HCC)    bruises easily    Past Surgical History:  Procedure Laterality Date   ARTHROTOMY Left 09/18/2022   Procedure: Fourth and Fifth Tarsal Metatarsal Phalangeal Arthrotomy and Debridement; Protein Rich Plasma injection to 4-5 Tarsalmetarsal joint;  Surgeon: Toni Arthurs, MD;  Location: [facility];  Service: Orthopedics;  Laterality: Left;   BONE EXOSTOSIS EXCISION Left 02/24/2019   Procedure: Dorsal Exostectomy;  Surgeon: Toni Arthurs, MD;  Location: [facility];  Service: Orthopedics;   Laterality: Left;   CESAREAN SECTION     FOOT ARTHRODESIS Left 09/18/2022   Procedure: Arthrodesis Tarsal Metatarsal Joint 1-3;  Surgeon: Toni Arthurs, MD;  Location: [facility];  Service: Orthopedics;  Laterality: Left;   GASTROCNEMIUS RECESSION Left 09/18/2022   Procedure: Gastrocnemius Recession;  Surgeon: Toni Arthurs, MD;  Location: [facility];  Service: Orthopedics;  Laterality: Left;   laparoscopy removal of right ovarian cyst     left knee arthroscopy     NOVASURE ABLATION  12/2005   pylonidal cyst removal     SEPTOPLASTY     TOOTH EXTRACTION  12/2020    Prior to Admission medications   Medication Sig Start Date End Date Taking? Authorizing Provider  Atogepant (QULIPTA) 60 MG TABS Take 1 tablet (60 mg total) by mouth daily. Patient not taking: Reported on 06/04/2023 01/13/23   Anson Fret, MD  cholecalciferol (VITAMIN D3) 25 MCG (1000 UNIT) tablet Take 1,000 Units by mouth daily.    [provider]  clobetasol ointment (TEMOVATE) 0.05 % Apply 1 thin Application topically to affected areas 2 (two) times daily. Patient not taking: Reported on 06/04/2023 09/03/22     conjugated estrogens (PREMARIN) vaginal cream Insert 0.5 g vaginally nightly for 2 weeks then use 2-3 times a week Patient taking differently: Place vaginally. As needed 05/28/21     Estradiol (YUVAFEM) 10 MCG TABS vaginal tablet Place 1 tablet (10 mcg total) vaginally 3 (  three) times a week. Patient not taking: Reported on 06/04/2023 02/04/23     fluconazole (DIFLUCAN) 150 MG tablet Take 1 tablet (150 mg total) by mouth as needed candidiasis flareup 06/26/22     gabapentin (NEURONTIN) 300 MG capsule Take 1 capsule (300 mg total) by mouth 2 (two) times daily. 03/18/23   Anson Fret, MD  metoprolol succinate (TOPROL-XL) 25 MG 24 hr tablet Take 1 tablet (25 mg total) by mouth daily. Patient not taking: Reported on 06/04/2023 11/25/22     naratriptan (AMERGE) 2.5 MG tablet Take one tablet  by mouth at onset of headache; if returns or does not resolve, may repeat after 2-4 hours; do not exceed two tabs in 24 hours. 10/30/22   Anson Fret, MD  nystatin cream (MYCOSTATIN) Apply 1 Application topically 2 (two) times daily. 01/30/23     Probiotic Product (UP4 PROBIOTICS PO) Take by mouth.    [provider]  traZODone (DESYREL) 50 MG tablet Take 1-2 tablets (50-100 mg total) by mouth at bedtime. 02/03/23   Dohmeier, Porfirio Mylar, MD  vortioxetine HBr (TRINTELLIX) 10 MG TABS tablet Take 1 tablet (10 mg total) by mouth daily. 07/31/22     zolpidem (AMBIEN) 10 MG tablet Take 1 tablet (10 mg total) by mouth at bedtime. 05/21/23   Butch Penny, NP    Current Outpatient Medications  Medication Sig Dispense Refill   Atogepant (QULIPTA) 60 MG TABS Take 1 tablet (60 mg total) by mouth daily. (Patient not taking: Reported on 06/04/2023) 30 tablet 5   cholecalciferol (VITAMIN D3) 25 MCG (1000 UNIT) tablet Take 1,000 Units by mouth daily.     clobetasol ointment (TEMOVATE) 0.05 % Apply 1 thin Application topically to affected areas 2 (two) times daily. (Patient not taking: Reported on 06/04/2023) 30 g 0   conjugated estrogens (PREMARIN) vaginal cream Insert 0.5 g vaginally nightly for 2 weeks then use 2-3 times a week (Patient taking differently: Place vaginally. As needed) 30 g 2   Estradiol (YUVAFEM) 10 MCG TABS vaginal tablet Place 1 tablet (10 mcg total) vaginally 3 (three) times a week. (Patient not taking: Reported on 06/04/2023) 18 tablet 3   fluconazole (DIFLUCAN) 150 MG tablet Take 1 tablet (150 mg total) by mouth as needed candidiasis flareup 15 tablet 1   gabapentin (NEURONTIN) 300 MG capsule Take 1 capsule (300 mg total) by mouth 2 (two) times daily. 60 capsule 8   metoprolol succinate (TOPROL-XL) 25 MG 24 hr tablet Take 1 tablet (25 mg total) by mouth daily. (Patient not taking: Reported on 06/04/2023) 90 tablet 1   naratriptan (AMERGE) 2.5 MG tablet Take one tablet by mouth at onset  of headache; if returns or does not resolve, may repeat after 2-4 hours; do not exceed two tabs in 24 hours. 9 tablet 3   nystatin cream (MYCOSTATIN) Apply 1 Application topically 2 (two) times daily. 60 g 1   Probiotic Product (UP4 PROBIOTICS PO) Take by mouth.     traZODone (DESYREL) 50 MG tablet Take 1-2 tablets (50-100 mg total) by mouth at bedtime. 60 tablet 5   vortioxetine HBr (TRINTELLIX) 10 MG TABS tablet Take 1 tablet (10 mg total) by mouth daily. 90 tablet 3   zolpidem (AMBIEN) 10 MG tablet Take 1 tablet (10 mg total) by mouth at bedtime. 30 tablet 0   Current Facility-Administered Medications  Medication Dose Route Frequency Provider Last Rate Last Admin   0.9 %  sodium chloride infusion  500 mL Intravenous Continuous Norwood Levo  C, MD        Allergies as of 06/22/2023 - Review Complete 06/04/2023  Allergen Reaction Noted   Ciprofloxacin  09/11/2022   Levaquin [levofloxacin in d5w] Other (See Comments) 07/01/2018   Sulfa antibiotics Other (See Comments)     Family History  Problem Relation Age of Onset   Hypertension Mother    Arthritis Mother    Hypertension Father    Heart disease Father    Prostate cancer Father    Colon polyps Father    Lupus Sister    Ulcerative colitis Sister    Other Sister        mythensia gravis   Stomach cancer Maternal Grandmother    Colon cancer Neg Hx    Rectal cancer Neg Hx    Esophageal cancer Neg Hx    Liver cancer Neg Hx     Social History   Socioeconomic History   Marital status: Married    Spouse name: Not on file   Number of children: 2   Years of education: Not on file   Highest education level: Not on file  Occupational History   Occupation: CRNA  Tobacco Use   Smoking status: Former    Types: Cigarettes   Smokeless tobacco: Never  Vaping Use   Vaping status: Never Used  Substance and Sexual Activity   Alcohol use: Yes    Alcohol/week: 10.0 - 15.0 standard drinks of alcohol    Types: 10 - 15 Glasses of wine  per week    Comment: wine or vodka; was previously 1-2 per night, but liver enzymes up so she has cut back   Drug use: No   Sexual activity: Yes    Partners: Male    Comment: husband vasectomy-1st intercourse 21 yo-5 partners  Other Topics Concern   Not on file  Social History Narrative   Lives at home with husband and 2 children who are freshman in college   Right handed   Caffeine: maybe 1 cup/day   Social Determinants of Corporate investment banker Strain: Not on file  Food Insecurity: Not on file  Transportation Needs: Not on file  Physical Activity: Not on file  Stress: Not on file  Social Connections: Not on file  Intimate Partner Violence: Not on file    Physical Exam: Vital signs in last 24 hours: BP 121/65   Pulse 64   Temp 98 F (36.7 C)   Ht 5\' 8"  (1.727 m)   Wt 177 lb (80.3 kg)   LMP 11/14/2012   SpO2 99%   BMI 26.91 kg/m  GEN: NAD EYE: Sclerae anicteric ENT: MMM CV: Non-tachycardic Pulm: No increased WOB GI: Soft NEURO:  Alert & Oriented   Eulah Pont, MD [facility]   06/22/2023 12:42 PM

## 2023-06-22 NOTE — Patient Instructions (Signed)
Thank you for letting us take care of your healthcare needs today. Please see handouts given to you on Polyps. Avoid NSAID use. Tylenol is ok! Bentyl RX was sent to your Pharmacy.    YOU HAD AN ENDOSCOPIC PROCEDURE TODAY AT THE [facility]:   Refer to the procedure report that was given to you for any specific questions about what was found during the examination.  If the procedure report does not answer your questions, please call your gastroenterologist to clarify.  If you requested that your care partner not be given the details of your procedure findings, then the procedure report has been included in a sealed envelope for you to review at your convenience later.  YOU SHOULD EXPECT: Some feelings of bloating in the abdomen. Passage of more gas than usual.  Walking can help get rid of the air that was put into your GI tract during the procedure and reduce the bloating. If you had a lower endoscopy (such as a colonoscopy or flexible sigmoidoscopy) you may notice spotting of blood in your stool or on the toilet paper. If you underwent a bowel prep for your procedure, you may not have a normal bowel movement for a few days.  Please Note:  You might notice some irritation and congestion in your nose or some drainage.  This is from the oxygen used during your procedure.  There is no need for concern and it should clear up in a day or so.  SYMPTOMS TO REPORT IMMEDIATELY:  Following lower endoscopy (colonoscopy or flexible sigmoidoscopy):  Excessive amounts of blood in the stool  Significant tenderness or worsening of abdominal pains  Swelling of the abdomen that is new, acute  Fever of 100F or higher  For urgent or emergent issues, a gastroenterologist can be reached at any hour by calling [phone]. Do not use MyChart messaging for urgent concerns.    DIET:  We do recommend a small meal at first, but then you may proceed to your regular diet.  Drink plenty of fluids but you  should avoid alcoholic beverages for 24 hours.  ACTIVITY:  You should plan to take it easy for the rest of today and you should NOT DRIVE or use heavy machinery until tomorrow (because of the sedation medicines used during the test).    FOLLOW UP: Our staff will call the number listed on your records the next business day following your procedure.  We will call around 7:15- 8:00 am to check on you and address any questions or concerns that you may have regarding the information given to you following your procedure. If we do not reach you, we will leave a message.     If any biopsies were taken you will be contacted by phone or by letter within the next 1-3 weeks.  Please call us at [phone] if you have not heard about the biopsies in 3 weeks.    SIGNATURES/CONFIDENTIALITY: You and/or your care partner have signed paperwork which will be entered into your electronic medical record.  These signatures attest to the fact that that the information above on your After Visit Summary has been reviewed and is understood.  Full responsibility of the confidentiality of this discharge information lies with you and/or your care-partner.

## 2023-06-22 NOTE — Progress Notes (Signed)
Called to room to assist during endoscopic procedure.  Patient ID and intended procedure confirmed with present staff. Received instructions for my participation in the procedure from the performing physician.  

## 2023-06-22 NOTE — Progress Notes (Signed)
Report to PACU, RN, vss, BBS= Clear.  

## 2023-06-23 ENCOUNTER — Other Ambulatory Visit: Payer: Self-pay

## 2023-06-23 ENCOUNTER — Telehealth: Payer: Self-pay

## 2023-06-23 NOTE — Telephone Encounter (Signed)
  Follow up Call-     06/22/2023   12:42 PM  Call back number  Post procedure Call Back phone  # [phone]  Permission to leave phone message Yes     Patient questions:  Do you have a fever, pain , or abdominal swelling? No. Pain Score  0 *  Have you tolerated food without any problems? Yes.    Have you been able to return to your normal activities? Yes.    Do you have any questions about your discharge instructions: Diet   No. Medications  No. Follow up visit  No.  Do you have questions or concerns about your Care? No.  Actions: * If pain score is 4 or above: No action needed, pain <4.

## 2023-06-25 LAB — SURGICAL PATHOLOGY

## 2023-06-29 ENCOUNTER — Encounter: Payer: Self-pay | Admitting: Internal Medicine

## 2023-06-30 DIAGNOSIS — E349 Endocrine disorder, unspecified: Secondary | ICD-10-CM | POA: Diagnosis not present

## 2023-06-30 DIAGNOSIS — Z1231 Encounter for screening mammogram for malignant neoplasm of breast: Secondary | ICD-10-CM | POA: Diagnosis not present

## 2023-07-02 NOTE — Progress Notes (Signed)
Called the patient regarding her pathology results, which showed inflammation in the small bowel that is due to Crohn's disease, NSAID use, or infection. Her sister does have UC. Patient is not interested in starting any treatment for Crohn's disease at this time. She had COVID 2 months ago and is dealing with oral thrush at this time. She is on fluconazole therapy. She does take Motrin and Mobic, which she has been trying to cut back on. I recommended that we repeat a colonoscopy in 1 year to see if the inflammation in her ileum is still present. Patient is agreeable to this.  Morrison Old, okay to cancel her clinic appt with me in 08/2023 since patient is not interested in starting any treatment for Crohn's disease at this time.

## 2023-07-14 DIAGNOSIS — G4733 Obstructive sleep apnea (adult) (pediatric): Secondary | ICD-10-CM | POA: Diagnosis not present

## 2023-07-14 DIAGNOSIS — G43009 Migraine without aura, not intractable, without status migrainosus: Secondary | ICD-10-CM | POA: Diagnosis not present

## 2023-07-14 DIAGNOSIS — M1712 Unilateral primary osteoarthritis, left knee: Secondary | ICD-10-CM | POA: Diagnosis not present

## 2023-07-14 DIAGNOSIS — M19072 Primary osteoarthritis, left ankle and foot: Secondary | ICD-10-CM | POA: Diagnosis not present

## 2023-07-14 DIAGNOSIS — Z780 Asymptomatic menopausal state: Secondary | ICD-10-CM | POA: Diagnosis not present

## 2023-07-14 DIAGNOSIS — Z8619 Personal history of other infectious and parasitic diseases: Secondary | ICD-10-CM | POA: Diagnosis not present

## 2023-07-14 DIAGNOSIS — I493 Ventricular premature depolarization: Secondary | ICD-10-CM | POA: Diagnosis not present

## 2023-07-14 DIAGNOSIS — Z0000 Encounter for general adult medical examination without abnormal findings: Secondary | ICD-10-CM | POA: Diagnosis not present

## 2023-07-14 DIAGNOSIS — E785 Hyperlipidemia, unspecified: Secondary | ICD-10-CM | POA: Diagnosis not present

## 2023-07-14 DIAGNOSIS — D6800 Von Willebrand disease, unspecified: Secondary | ICD-10-CM | POA: Diagnosis not present

## 2023-07-14 DIAGNOSIS — F3341 Major depressive disorder, recurrent, in partial remission: Secondary | ICD-10-CM | POA: Diagnosis not present

## 2023-07-17 ENCOUNTER — Other Ambulatory Visit: Payer: Self-pay

## 2023-07-17 ENCOUNTER — Other Ambulatory Visit: Payer: Self-pay | Admitting: Neurology

## 2023-07-17 ENCOUNTER — Other Ambulatory Visit (HOSPITAL_COMMUNITY): Payer: Self-pay

## 2023-07-17 MED ORDER — FLUCONAZOLE 150 MG PO TABS
150.0000 mg | ORAL_TABLET | ORAL | 1 refills | Status: DC | PRN
  Filled 2023-07-17: qty 15, 15d supply, fill #0
  Filled 2023-10-11: qty 15, 15d supply, fill #1

## 2023-07-20 ENCOUNTER — Other Ambulatory Visit (HOSPITAL_COMMUNITY): Payer: Self-pay

## 2023-07-20 ENCOUNTER — Other Ambulatory Visit: Payer: Self-pay

## 2023-07-20 MED ORDER — PREMARIN 0.625 MG/GM VA CREA
TOPICAL_CREAM | VAGINAL | 1 refills | Status: AC
  Filled 2023-07-20: qty 30, 90d supply, fill #0

## 2023-07-21 ENCOUNTER — Other Ambulatory Visit (HOSPITAL_COMMUNITY): Payer: Self-pay

## 2023-07-21 MED ORDER — NARATRIPTAN HCL 2.5 MG PO TABS
2.5000 mg | ORAL_TABLET | ORAL | 3 refills | Status: DC | PRN
  Filled 2023-07-21: qty 9, 27d supply, fill #0
  Filled 2023-08-23 – 2023-08-24 (×2): qty 9, 27d supply, fill #1

## 2023-07-21 NOTE — Telephone Encounter (Signed)
Rx refilled per last office visit note.

## 2023-07-22 ENCOUNTER — Other Ambulatory Visit: Payer: Self-pay

## 2023-07-28 ENCOUNTER — Telehealth: Payer: Self-pay

## 2023-07-28 NOTE — Telephone Encounter (Signed)
*  [facility]  Prior Authorization form/request asks a question that requires your assistance. Please see the question below and advise accordingly.    Is patient still taking Qulipta? Chart shows patient reported not taking in 05/2023.

## 2023-07-28 NOTE — Telephone Encounter (Signed)
I sent the pt a mychart message.

## 2023-07-28 NOTE — Telephone Encounter (Signed)
Patient states she is doing well and didn't end up needing to start the Qulipta so please disregard the PA. Thank you!

## 2023-08-03 ENCOUNTER — Ambulatory Visit: Payer: 59 | Admitting: Adult Health

## 2023-08-04 DIAGNOSIS — R682 Dry mouth, unspecified: Secondary | ICD-10-CM | POA: Insufficient documentation

## 2023-08-04 DIAGNOSIS — F3341 Major depressive disorder, recurrent, in partial remission: Secondary | ICD-10-CM | POA: Insufficient documentation

## 2023-08-04 DIAGNOSIS — G43009 Migraine without aura, not intractable, without status migrainosus: Secondary | ICD-10-CM | POA: Insufficient documentation

## 2023-08-04 DIAGNOSIS — D6800 Von Willebrand disease, unspecified: Secondary | ICD-10-CM | POA: Insufficient documentation

## 2023-08-04 DIAGNOSIS — Z8619 Personal history of other infectious and parasitic diseases: Secondary | ICD-10-CM | POA: Insufficient documentation

## 2023-08-04 DIAGNOSIS — I493 Ventricular premature depolarization: Secondary | ICD-10-CM | POA: Insufficient documentation

## 2023-08-04 DIAGNOSIS — M19072 Primary osteoarthritis, left ankle and foot: Secondary | ICD-10-CM | POA: Insufficient documentation

## 2023-08-04 DIAGNOSIS — M1712 Unilateral primary osteoarthritis, left knee: Secondary | ICD-10-CM | POA: Insufficient documentation

## 2023-08-04 DIAGNOSIS — K5000 Crohn's disease of small intestine without complications: Secondary | ICD-10-CM | POA: Insufficient documentation

## 2023-08-04 DIAGNOSIS — Z780 Asymptomatic menopausal state: Secondary | ICD-10-CM | POA: Insufficient documentation

## 2023-08-04 DIAGNOSIS — G4733 Obstructive sleep apnea (adult) (pediatric): Secondary | ICD-10-CM | POA: Insufficient documentation

## 2023-08-04 DIAGNOSIS — M79672 Pain in left foot: Secondary | ICD-10-CM | POA: Insufficient documentation

## 2023-08-04 NOTE — Progress Notes (Unsigned)
Subjective:   Reason for Infectious Disease Consult:  Recurrent Candidiasis and "chronic systemic candidiasis"--the latter of which does not exist in immunocompetent patients   Requesting Physician: Ardean Larsen, MD     Patient ID: [name], female    DOB: 04-Mar-1961, 62 y.o.   MRN: [id number]  HPI  Past Medical History:  Diagnosis Date   Anxiety    Arthritis    Back pain    Chronic rhinitis    Complication of anesthesia    Depression    Dysrhythmia    hx svt- pcp follows   Family history of colonic polyps    Hepatic hemangioma    Hyperlipidemia    IBS (irritable bowel syndrome)    Insomnia    Migraine    Osteoarthritis, foot, localized    left   Perimenopause    Peripheral neuropathy    PONV (postoperative nausea and vomiting)    Raynauds syndrome    Seasonal allergies    Von Willebrand's disease (HCC)    bruises easily    Past Surgical History:  Procedure Laterality Date   ARTHROTOMY Left 09/18/2022   Procedure: Fourth and Fifth Tarsal Metatarsal Phalangeal Arthrotomy and Debridement; Protein Rich Plasma injection to 4-5 Tarsalmetarsal joint;  Surgeon: Toni Arthurs, MD;  Location: [facility];  Service: Orthopedics;  Laterality: Left;   BONE EXOSTOSIS EXCISION Left 02/24/2019   Procedure: Dorsal Exostectomy;  Surgeon: Toni Arthurs, MD;  Location: [facility];  Service: Orthopedics;  Laterality: Left;   CESAREAN SECTION     FOOT ARTHRODESIS Left 09/18/2022   Procedure: Arthrodesis Tarsal Metatarsal Joint 1-3;  Surgeon: Toni Arthurs, MD;  Location: [facility];  Service: Orthopedics;  Laterality: Left;   GASTROCNEMIUS RECESSION Left 09/18/2022   Procedure: Gastrocnemius Recession;  Surgeon: Toni Arthurs, MD;  Location:  SURGERY CENTER;  Service: Orthopedics;  Laterality: Left;   laparoscopy removal of right ovarian cyst     left knee arthroscopy     NOVASURE ABLATION  12/2005   pylonidal cyst removal      SEPTOPLASTY     TOOTH EXTRACTION  12/2020    Family History  Problem Relation Age of Onset   Hypertension Mother    Arthritis Mother    Hypertension Father    Heart disease Father    Prostate cancer Father    Colon polyps Father    Lupus Sister    Ulcerative colitis Sister    Other Sister        mythensia gravis   Stomach cancer Maternal Grandmother    Colon cancer Neg Hx    Rectal cancer Neg Hx    Esophageal cancer Neg Hx    Liver cancer Neg Hx       Social History   Socioeconomic History   Marital status: Married    Spouse name: Not on file   Number of children: 2   Years of education: Not on file   Highest education level: Not on file  Occupational History   Occupation: CRNA  Tobacco Use   Smoking status: Former    Types: Cigarettes   Smokeless tobacco: Never  Vaping Use   Vaping status: Never Used  Substance and Sexual Activity   Alcohol use: Yes    Alcohol/week: 10.0 - 15.0 standard drinks of alcohol    Types: 10 - 15 Glasses of wine per week    Comment: wine or vodka; was previously 1-2 per night, but liver enzymes up so  she has cut back   Drug use: No   Sexual activity: Yes    Partners: Male    Comment: husband vasectomy-1st intercourse 21 yo-5 partners  Other Topics Concern   Not on file  Social History Narrative   Lives at home with husband and 2 children who are freshman in college   Right handed   Caffeine: maybe 1 cup/day   Social Determinants of Corporate investment banker Strain: Not on file  Food Insecurity: Not on file  Transportation Needs: Not on file  Physical Activity: Not on file  Stress: Not on file  Social Connections: Not on file    Allergies  Allergen Reactions   Ciprofloxacin     Tendonitis    Levaquin [Levofloxacin In D5w] Other (See Comments)    Tendonitis in shoulder, severe yeast infections   Sulfa Antibiotics Other (See Comments)    Unknown reaction - childhood allergy     Current Outpatient Medications:     Atogepant (QULIPTA) 60 MG TABS, Take 1 tablet (60 mg total) by mouth daily. (Patient not taking: Reported on 06/04/2023), Disp: 30 tablet, Rfl: 5   cholecalciferol (VITAMIN D3) 25 MCG (1000 UNIT) tablet, Take 1,000 Units by mouth daily., Disp: , Rfl:    clobetasol ointment (TEMOVATE) 0.05 %, Apply 1 thin Application topically to affected areas 2 (two) times daily. (Patient not taking: Reported on 06/04/2023), Disp: 30 g, Rfl: 0   conjugated estrogens (PREMARIN) vaginal cream, Insert 0.5 g vaginally nightly for 2 weeks then use 2-3 times a week, Disp: 30 g, Rfl: 1   dicyclomine (BENTYL) 10 MG capsule, Take 1 capsule (10 mg total) by mouth 4 (four) times daily -  before meals and at bedtime. (Patient taking differently: Take 10 mg by mouth 3 (three) times daily before meals.), Disp: 90 capsule, Rfl: 0   fluconazole (DIFLUCAN) 150 MG tablet, Take 1 tablet (150 mg total) by mouth as needed for candidiasis flareup., Disp: 15 tablet, Rfl: 1   gabapentin (NEURONTIN) 300 MG capsule, Take 1 capsule (300 mg total) by mouth 2 (two) times daily., Disp: 60 capsule, Rfl: 8   metoprolol succinate (TOPROL-XL) 25 MG 24 hr tablet, Take 1 tablet (25 mg total) by mouth daily. (Patient not taking: Reported on 06/04/2023), Disp: 90 tablet, Rfl: 1   naratriptan (AMERGE) 2.5 MG tablet, Take one tablet by mouth at onset of headache; if returns or does not resolve, may repeat after 2-4 hours; do not exceed two tabs in 24 hours., Disp: 9 tablet, Rfl: 3   nystatin cream (MYCOSTATIN), Apply 1 Application topically 2 (two) times daily., Disp: 60 g, Rfl: 1   Probiotic Product (UP4 PROBIOTICS PO), Take by mouth., Disp: , Rfl:    traZODone (DESYREL) 50 MG tablet, Take 1-2 tablets (50-100 mg total) by mouth at bedtime., Disp: 60 tablet, Rfl: 5   vortioxetine HBr (TRINTELLIX) 10 MG TABS tablet, Take 1 tablet (10 mg total) by mouth daily., Disp: 90 tablet, Rfl: 3    Review of Systems     Objective:   Physical Exam         Assessment & Plan:

## 2023-08-05 ENCOUNTER — Encounter: Payer: Self-pay | Admitting: Infectious Disease

## 2023-08-05 ENCOUNTER — Other Ambulatory Visit: Payer: Self-pay

## 2023-08-05 ENCOUNTER — Ambulatory Visit: Payer: 59 | Admitting: Infectious Disease

## 2023-08-05 VITALS — BP 103/70 | HR 72 | Temp 97.7°F | Ht 68.0 in | Wt 180.0 lb

## 2023-08-05 DIAGNOSIS — Z87891 Personal history of nicotine dependence: Secondary | ICD-10-CM | POA: Diagnosis not present

## 2023-08-05 DIAGNOSIS — Z7185 Encounter for immunization safety counseling: Secondary | ICD-10-CM

## 2023-08-05 DIAGNOSIS — B379 Candidiasis, unspecified: Secondary | ICD-10-CM

## 2023-08-05 DIAGNOSIS — K146 Glossodynia: Secondary | ICD-10-CM | POA: Diagnosis not present

## 2023-08-05 DIAGNOSIS — K50919 Crohn's disease, unspecified, with unspecified complications: Secondary | ICD-10-CM | POA: Diagnosis not present

## 2023-08-05 DIAGNOSIS — K141 Geographic tongue: Secondary | ICD-10-CM

## 2023-08-05 DIAGNOSIS — I10 Essential (primary) hypertension: Secondary | ICD-10-CM

## 2023-08-05 NOTE — Addendum Note (Signed)
Addended by: Harley Alto on: 08/05/2023 02:22 PM   Modules accepted: Orders

## 2023-08-10 ENCOUNTER — Telehealth: Payer: Self-pay

## 2023-08-10 NOTE — Telephone Encounter (Signed)
-----   Message from [place] sent at 08/10/2023  1:29 PM EDT ----- Apparently the the neutrophil oxidative burst test wasn't done properly. They need a separate healthy donor specimen along with patients serum. I remember that in the past the phlebotomists had to draw my blood when they were drawn on other patients. If patient wants test repeated she would need to come back. I dont think it absolutely necessary ----- Message ----- From: Interface, [facility] Results In Sent: 08/06/2023   2:16 AM EDT To: Randall Hiss, MD

## 2023-08-12 ENCOUNTER — Other Ambulatory Visit: Payer: 59

## 2023-08-14 LAB — B12 AND FOLATE PANEL
Folate: 7.2 ng/mL
Vitamin B-12: 283 pg/mL (ref 200–1100)

## 2023-08-14 LAB — ZINC: Zinc: 68 ug/dL (ref 60–130)

## 2023-08-14 LAB — NEUTROPHIL OXD BURST
% OXIDATION POSITIVE NEUTROPHILS: 81 %
SPECIMEN AGE: 72

## 2023-08-14 LAB — IGG, IGA, IGM
IgG (Immunoglobin G), Serum: 800 mg/dL (ref 600–1540)
IgM, Serum: 85 mg/dL (ref 50–300)
Immunoglobulin A: 180 mg/dL (ref 70–320)

## 2023-08-20 ENCOUNTER — Other Ambulatory Visit: Payer: Self-pay

## 2023-08-20 ENCOUNTER — Other Ambulatory Visit: Payer: 59

## 2023-08-20 DIAGNOSIS — B379 Candidiasis, unspecified: Secondary | ICD-10-CM

## 2023-08-20 DIAGNOSIS — K146 Glossodynia: Secondary | ICD-10-CM | POA: Diagnosis not present

## 2023-08-22 ENCOUNTER — Encounter: Payer: Self-pay | Admitting: Internal Medicine

## 2023-08-23 ENCOUNTER — Other Ambulatory Visit: Payer: Self-pay | Admitting: Internal Medicine

## 2023-08-23 LAB — VITAMIN B6: Vitamin B6: 32.2 ng/mL — ABNORMAL HIGH (ref 2.1–21.7)

## 2023-08-24 ENCOUNTER — Other Ambulatory Visit (HOSPITAL_COMMUNITY): Payer: Self-pay

## 2023-08-24 ENCOUNTER — Ambulatory Visit: Payer: 59 | Admitting: Internal Medicine

## 2023-08-24 ENCOUNTER — Other Ambulatory Visit: Payer: Self-pay

## 2023-08-24 MED ORDER — DICYCLOMINE HCL 10 MG PO CAPS
10.0000 mg | ORAL_CAPSULE | Freq: Three times a day (TID) | ORAL | 0 refills | Status: AC | PRN
  Filled 2023-08-24 (×2): qty 90, 30d supply, fill #0

## 2023-08-25 LAB — NEUTROPHIL OXD BURST
% OXIDATION POSITIVE NEUTROPHILS: 51 %
SPECIMEN AGE: 48

## 2023-08-30 ENCOUNTER — Encounter: Payer: Self-pay | Admitting: Infectious Disease

## 2023-09-01 ENCOUNTER — Telehealth: Payer: Self-pay | Admitting: Internal Medicine

## 2023-09-01 NOTE — Telephone Encounter (Signed)
Called the patient back. Voicemail. Left message of my call.

## 2023-09-01 NOTE — Telephone Encounter (Signed)
Patient requesting f/u call to discuss abdominal pain. Please advise.

## 2023-09-01 NOTE — Telephone Encounter (Signed)
Spoke with the patient. She is ready to consider a biologic. She is taking the Bentyl BID to TID on some days. She has continued to have diarrhea. She would like to know what biologics you are considering so she can research and make a decision. She is in the OR for the next 2 days. She does use My Chart. It is okay to leave voicemail.

## 2023-09-02 NOTE — Telephone Encounter (Signed)
For her, I would initially do a course of budesonide. I would treat with 9 mg for 4 weeks, then 6 mg for 4 weeks, then 3 mg for 4 weeks. If her diarrhea responds to the budesonide, then we could consider maintenance therapy, most likely with Skyrizi. Let me know what she decides.

## 2023-09-03 DIAGNOSIS — M1712 Unilateral primary osteoarthritis, left knee: Secondary | ICD-10-CM | POA: Diagnosis not present

## 2023-09-03 NOTE — Telephone Encounter (Signed)
Message to the patient. Will await her decision.

## 2023-09-15 ENCOUNTER — Other Ambulatory Visit (HOSPITAL_COMMUNITY): Payer: Self-pay

## 2023-09-22 DIAGNOSIS — M19072 Primary osteoarthritis, left ankle and foot: Secondary | ICD-10-CM | POA: Diagnosis not present

## 2023-09-22 DIAGNOSIS — M7918 Myalgia, other site: Secondary | ICD-10-CM | POA: Diagnosis not present

## 2023-09-22 DIAGNOSIS — M25551 Pain in right hip: Secondary | ICD-10-CM | POA: Diagnosis not present

## 2023-09-27 DIAGNOSIS — M25562 Pain in left knee: Secondary | ICD-10-CM | POA: Diagnosis not present

## 2023-09-28 ENCOUNTER — Other Ambulatory Visit (HOSPITAL_COMMUNITY): Payer: Self-pay

## 2023-09-28 MED ORDER — BUPROPION HCL ER (XL) 150 MG PO TB24
150.0000 mg | ORAL_TABLET | Freq: Every day | ORAL | 1 refills | Status: DC
  Filled 2023-09-28: qty 90, 90d supply, fill #0
  Filled 2023-12-23: qty 90, 90d supply, fill #1

## 2023-10-01 ENCOUNTER — Encounter: Payer: Self-pay | Admitting: Anesthesiology

## 2023-10-06 ENCOUNTER — Other Ambulatory Visit: Payer: Self-pay

## 2023-10-06 ENCOUNTER — Encounter: Payer: Self-pay | Admitting: Adult Health

## 2023-10-06 ENCOUNTER — Ambulatory Visit: Payer: 59 | Admitting: Adult Health

## 2023-10-06 ENCOUNTER — Other Ambulatory Visit (HOSPITAL_COMMUNITY): Payer: Self-pay

## 2023-10-06 VITALS — BP 105/73 | HR 76 | Ht 68.0 in | Wt 180.6 lb

## 2023-10-06 DIAGNOSIS — G43101 Migraine with aura, not intractable, with status migrainosus: Secondary | ICD-10-CM

## 2023-10-06 DIAGNOSIS — N951 Menopausal and female climacteric states: Secondary | ICD-10-CM

## 2023-10-06 MED ORDER — GABAPENTIN 300 MG PO CAPS
300.0000 mg | ORAL_CAPSULE | Freq: Three times a day (TID) | ORAL | 11 refills | Status: DC
  Filled 2023-10-06 – 2023-10-19 (×3): qty 90, 30d supply, fill #0
  Filled 2023-11-17: qty 90, 30d supply, fill #1
  Filled 2023-12-11: qty 90, 30d supply, fill #2
  Filled 2024-01-12: qty 90, 30d supply, fill #3
  Filled 2024-03-19: qty 90, 30d supply, fill #4
  Filled 2024-04-17: qty 90, 30d supply, fill #5
  Filled 2024-05-15 – 2024-05-17 (×2): qty 90, 30d supply, fill #6
  Filled 2024-07-20: qty 90, 30d supply, fill #7
  Filled 2024-08-19: qty 90, 30d supply, fill #8
  Filled 2024-09-16: qty 90, 30d supply, fill #9

## 2023-10-06 MED ORDER — NARATRIPTAN HCL 2.5 MG PO TABS
2.5000 mg | ORAL_TABLET | ORAL | 3 refills | Status: DC | PRN
  Filled 2023-10-06: qty 9, 27d supply, fill #0
  Filled 2023-12-23: qty 9, 27d supply, fill #1
  Filled 2024-03-19: qty 9, 27d supply, fill #2
  Filled 2024-04-17: qty 9, 27d supply, fill #3

## 2023-10-06 NOTE — Progress Notes (Signed)
PATIENT: [name] DOB: 1961-05-19  REASON FOR VISIT: follow up HISTORY FROM: patient  Chief Complaint  Patient presents with   Room 20    Pt is here Alone. Pt states that she has been doing great with her migraines. Pt states she would like to discuss going back to have a 90 day supply of her Gabapentin 300 mg.      HISTORY OF PRESENT ILLNESS: Today 10/06/23:  [name] is a 62 y.o. female with a history of migraine headaches and obstructive sleep apnea. Returns today for follow-up.   Migraines: Patient states that her migraines are manageable.  She never started [place] as her headaches decreased.  She continues to use gabapentin 300 mg twice a day but lately she has been taking it TID due to hot flashes. She inially started on gabapentin for hot flashes and it helps. Continues to take naratriptan for abortive therapy and this works well for her.  OSA: has not restarted. Unable to tolerate it.      12/18/22: [name] is a 62 y.o. female with a history of Migraine headaches and OSA. Returns today for follow-up.   Struggle with using the CPAP. Doesn't sleep well with it. Wakes her up at night.  Continues to use trazodone.  She is going to try restarting the CPAP.  Migraines: has atleast 3-4 migraines a month. Phonophobia. Migraine usually starts with visual changes. She had 2 episodes where she temporaily got lost. Naratriptan works well- resolves in 30 minutes. No longer doing Emgality due to site reaction. Continues to take gabapentin 300 mg twice a day.   Has been out of work due to foot surgery. Will start back at work next week.   04/04/2021: Patient is here for follow-up of transient global amnesia and new onset of headaches.  Patient was started on gabapentin which she feels was extremely helpful for her headaches however it is making her gain weight, and also help with her hot flashes.  She is also on [name]. She thinks the gabapentin has helped  her headaches. She desn't think it was the [name]. She was taking it 300mg  bid and 600mg  qhs. Now she is taking it more at night and back to taking 300mg  at bedtime.  She was doing extremely well with her headaches however over the last week there has been some stress and she has recurrent headaches.  I encouraged her to continue the Emgality and we can follow-up again in 3 months.  She can continue the gabapentin at a lower dose.   Patient had a very strange episode where she ordered from [facility] at 315 and 415 in the morning and doesn't remember doing it. She does take [place] but this has never happened to her.  We discussed could be a medication effect, I find it highly unlikely that she could sign onto [place] and in order items if she were having a seizure even if it were complex partial, but cannot rule it out.   - we will continue the ajovy, take benadryl prior for any reactions.  If insurance prefers [place] we can order that instead.   - she is taking 300mg  qam and 300qpm helping with the hot flashes but she is gaining weight so she is going to see obgyn to help with hot flashes. She thinks gabapentin helped her headaches unfortunately.    - she wake often in the middle of the night, she is extremely fatigued during the day, we  could send to dr dohmeier and during the sleep study monitor eeg       01/07/2021: She has had a headache since she left. It is not getting better. She is having the migraines daily, the brain feels bruised, in the left eye, it may seem like it goes away but it comes back with any stress, light makes it worse.  She had the abscess found in the left side of her jaw, it is now improved but she continues to have headaches.  She did have a migraine cocktail in our office and that helped significantly but again can still continues to have headaches.  She tried the Axert, she tried the Maxalt, she tried and Nurtec, and still having headaches.  We discussed at this time it would  probably be best for her to start a preventative, I gave her [name] samples, we injected the first 1 in the office, we will check back in with her in 10 weeks and see how she is feeling.   12/12/20: [name] is a 62 year old female with a history of altered awareness, migraine headache. She returns today still with a daily headache.  In the past Dr. Daisy Blossom has tried a prednisone Dosepak, indomethacin, axert, nurtec and migraine infusion but this has not broken her headache cycle.  She does state that with the migraine infusion she did go a day and a half without headache.  In the last 2 weeks she has been diagnosed with an abscessed tooth.  She was originally placed on penicillin and had a tooth removed today.  She describes her headache today as a dull headache 2 out of 10 on the pain scale.  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  HISTORY (copied from Dr. Trevor Mace note)  [name] is a 62 y.o. female here as requested by  Burnadette Pop MD for migraines. PMHx von Willebrand's disease, ray nodes, peripheral neuropathy, migraine, insomnia, IBS, hyperlipidemia, depression, back pain, arthritis, anxiety.  I reviewed Burnadette Pop MDs discharge summary from November 15, 2020: Patient presented to the emergency room secondary to transient left-sided weakness, dizziness and visual defects, by the time physician saw her her symptoms were completely resolved and she felt back to normal, symptoms were present for several hours, she was last seen well when waking up in the morning, she expressed having episodes after showering when her leg gave way, while driving to work she experienced diplopia and triple vision, Rx to experienced a period of time according to her 20 minutes or so of transient amnesia, CT scan of the head was without acute abnormality, negative MRI, normal blood work, TIA/stroke work-up was negative, her symptoms were thought to be  secondary to status migrainosus, she was given multiple doses of migraine cocktail during hospitalization, she was discharged on Maxalt and valproic acid.   She feels "spacey", she still has a headache, has had it since she was seen at the ER, she was driving and she says she had triple vision and closed one eye and it resolved, she has had a headache for 2 months and she has had a headache since her friend passed away thanksgiving, she cared for her friend's mother who dies in hospice (she gave her morphine throught the night) she didn't sleep for several days helping out and a few days later had the incident driving and triple vision, she had a headache at the time (had been ongoing for months  in the setting of stress), she wakes up with migraine, better standing, waxes and wanes, prior to a few months ago she would get a headache once a month and caffeine and analgesic may help, but she would start with a visual problem, looks like a 70s wavy lines in both eyes, may not progress to a headache if she catches it on time. But this has been different. She lost 3 hours, she was at work, she was told she was appropriate ut doesn't remember 3 hours. Not doing well on Depakote. She is taking the maxalt 4-5 times worried the headache will come back. Migraine cocktail inpatient helped, she slept the first night int he hospital, she had severe light sensitivity, she had benadryl, compazine, decadron she felt better prior to going home but the headache has returned but not as severe since she came home. maxalt is helping.    Reviewed notes, labs and imaging from outside physicians, which showed:   From a thorough review of records, medications tried that can be used in migraine management includes Tylenol, aspirin, Fioricet, Flexeril, Decadron injections, Benadryl injections, Depakote, ibuprofen, ketorolac injections, magnesium, Depo-Medrol injection, Reglan injections, metoprolol, Zofran tablets and injections,  Compazine injections, rizatriptan, Effexor.     B12 373, TSH 2.6, hemoglobin A1c 5.2, LDL 134, CBC normal, BMP unremarkable, HIV negative.     MRI brain 11/13/2020: FINDINGS: Brain: Ventricle size and cerebral volume within normal limits. Several small hyperintensities in the left frontal lobe. Brainstem and cerebellum normal.   Negative for acute infarct.  Negative for hemorrhage or mass.   Vascular: Normal arterial flow voids.   Skull and upper cervical spine: Negative   Sinuses/Orbits: Paranasal sinuses clear negative orbit   Other: None   IMPRESSION: No acute abnormality. Mild white matter changes most likely due to chronic ischemia.   CTA H&N: IMPRESSION: 11/13/2020 No large vessel occlusion or hemodynamically significant proximal stenosis in the head or neck.   IMPRESSION: This study is within normal limits. No seizures or epileptiform discharges were seen throughout the recording.    REVIEW OF SYSTEMS: Out of a complete 14 system review of symptoms, the patient complains only of the following symptoms, and all other reviewed systems are negative. See HPI  ALLERGIES: Allergies  Allergen Reactions   Sulfa Antibiotics Other (See Comments)    Unknown reaction - childhood allergy   Ciprofloxacin     Tendonitis    Levaquin [Levofloxacin In D5w] Other (See Comments)    Tendonitis in shoulder, severe yeast infections    HOME MEDICATIONS: Outpatient Medications Prior to Visit  Medication Sig Dispense Refill   buPROPion (WELLBUTRIN XL) 150 MG 24 hr tablet Take 1 tablet (150 mg total) by mouth daily. 90 tablet 1   cholecalciferol (VITAMIN D3) 25 MCG (1000 UNIT) tablet Take 1,000 Units by mouth daily.     Coenzyme Q10 (CO Q-10) 50 MG CAPS Take by mouth.     conjugated estrogens (PREMARIN) vaginal cream Insert 0.5 g vaginally nightly for 2 weeks then use 2-3 times a week 30 g 1   dicyclomine (BENTYL) 10 MG capsule Take 1 capsule (10 mg total) by mouth 3 (three) times daily  as needed for spasms. 90 capsule 0   fluconazole (DIFLUCAN) 150 MG tablet Take 1 tablet (150 mg total) by mouth as needed for candidiasis flareup. 15 tablet 1   gabapentin (NEURONTIN) 300 MG capsule Take 1 capsule (300 mg total) by mouth 2 (two) times daily. 60 capsule 8   Multiple Vitamins-Minerals (  MULTIPLE VITAMINS/WOMENS) tablet Take 1 tablet by mouth daily.     naratriptan (AMERGE) 2.5 MG tablet Take one tablet by mouth at onset of headache; if returns or does not resolve, may repeat after 2-4 hours; do not exceed two tabs in 24 hours. 9 tablet 3   Probiotic Product (UP4 PROBIOTICS PO) Take by mouth.     traZODone (DESYREL) 50 MG tablet Take 1-2 tablets (50-100 mg total) by mouth at bedtime. 60 tablet 5   nystatin cream (MYCOSTATIN) Apply 1 Application topically 2 (two) times daily. (Patient not taking: Reported on 10/06/2023) 60 g 1   No facility-administered medications prior to visit.    PAST MEDICAL HISTORY: Past Medical History:  Diagnosis Date   Anxiety    Arthritis    Back pain    Chronic rhinitis    Complication of anesthesia    Depression    Dysrhythmia    hx svt- pcp follows   Family history of colonic polyps    Hepatic hemangioma    Hyperlipidemia    IBS (irritable bowel syndrome)    Insomnia    Migraine    Osteoarthritis, foot, localized    left   Perimenopause    Peripheral neuropathy    PONV (postoperative nausea and vomiting)    Raynauds syndrome    Seasonal allergies    Von Willebrand's disease (HCC)    bruises easily    PAST SURGICAL HISTORY: Past Surgical History:  Procedure Laterality Date   ARTHROTOMY Left 09/18/2022   Procedure: Fourth and Fifth Tarsal Metatarsal Phalangeal Arthrotomy and Debridement; Protein Rich Plasma injection to 4-5 Tarsalmetarsal joint;  Surgeon: Toni Arthurs, MD;  Location: [facility];  Service: Orthopedics;  Laterality: Left;   BONE EXOSTOSIS EXCISION Left 02/24/2019   Procedure: Dorsal Exostectomy;   Surgeon: Toni Arthurs, MD;  Location: [facility];  Service: Orthopedics;  Laterality: Left;   CESAREAN SECTION     FOOT ARTHRODESIS Left 09/18/2022   Procedure: Arthrodesis Tarsal Metatarsal Joint 1-3;  Surgeon: Toni Arthurs, MD;  Location: [facility];  Service: Orthopedics;  Laterality: Left;   GASTROCNEMIUS RECESSION Left 09/18/2022   Procedure: Gastrocnemius Recession;  Surgeon: Toni Arthurs, MD;  Location: [facility];  Service: Orthopedics;  Laterality: Left;   laparoscopy removal of right ovarian cyst     left knee arthroscopy     NOVASURE ABLATION  12/2005   pylonidal cyst removal     SEPTOPLASTY     TOOTH EXTRACTION  12/2020    FAMILY HISTORY: Family History  Problem Relation Age of Onset   Hypertension Mother    Arthritis Mother    Hypertension Father    Heart disease Father    Prostate cancer Father    Colon polyps Father    Lupus Sister    Ulcerative colitis Sister    Other Sister        mythensia gravis   Stomach cancer Maternal Grandmother    Colon cancer Neg Hx    Rectal cancer Neg Hx    Esophageal cancer Neg Hx    Liver cancer Neg Hx    Migraines Neg Hx     SOCIAL HISTORY: Social History   Socioeconomic History   Marital status: Married    Spouse name: Not on file   Number of children: 2   Years of education: Not on file   Highest education level: Not on file  Occupational History   Occupation: CRNA  Tobacco Use  Smoking status: Former    Types: Cigarettes   Smokeless tobacco: Never  Vaping Use   Vaping status: Never Used  Substance and Sexual Activity   Alcohol use: Yes    Alcohol/week: 10.0 - 15.0 standard drinks of alcohol    Types: 10 - 15 Glasses of wine per week    Comment: wine or vodka; was previously 1-2 per night, but liver enzymes up so she has cut back   Drug use: No   Sexual activity: Yes    Partners: Male    Comment: husband vasectomy-1st intercourse 21 yo-5 partners  Other Topics  Concern   Not on file  Social History Narrative   Lives at home with husband and 2 children who are freshman in college   Right handed   Caffeine: maybe 1 cup/day   Social Drivers of Corporate investment banker Strain: Not on file  Food Insecurity: Not on file  Transportation Needs: Not on file  Physical Activity: Not on file  Stress: Not on file  Social Connections: Not on file  Intimate Partner Violence: Not on file      PHYSICAL EXAM  Vitals:   10/06/23 0935  BP: 105/73  Pulse: 76  Weight: 180 lb 9.6 oz (81.9 kg)  Height: 5\' 8"  (1.727 m)    Body mass index is 27.46 kg/m.  Generalized: Well developed, in no acute distress   Neurological examination  Mentation: Alert oriented to time, place, history taking. Follows all commands speech and language fluent Cranial nerve II-XII: Pupils were equal round reactive to light. Extraocular movements were full, visual field were full on confrontational test. Facial sensation and strength were normal. Head turning and shoulder shrug  were normal and symmetric. Motor: The motor testing reveals 5 over 5 strength of all 4 extremities. Good symmetric motor tone is noted throughout.  Sensory: Sensory testing is intact to soft touch on all 4 extremities. No evidence of extinction is noted.  Coordination: Cerebellar testing reveals good finger-nose-finger and heel-to-shin bilaterally.  Gait and station: Gait is normal.    DIAGNOSTIC DATA (LABS, IMAGING, TESTING) - I reviewed patient records, labs, notes, testing and imaging myself where available.  Lab Results  Component Value Date   WBC 5.9 11/13/2020   HGB 13.1 11/13/2020   HCT 40.3 11/13/2020   MCV 96.0 11/13/2020   PLT 182 11/13/2020      Component Value Date/Time   NA 137 11/13/2020 0811   NA 138 12/09/2019 1052   K 4.1 11/13/2020 0811   CL 102 11/13/2020 0811   CO2 27 11/13/2020 0811   GLUCOSE 105 (H) 11/13/2020 0811   BUN 15 11/13/2020 0811   BUN 15 12/09/2019  1052   CREATININE 0.68 11/13/2020 0811   CREATININE 0.82 08/22/2014 1130   CALCIUM 8.9 11/13/2020 0811   PROT 6.4 08/22/2014 1130   ALBUMIN 4.1 08/22/2014 1130   AST 20 08/22/2014 1130   ALT 23 08/22/2014 1130   ALKPHOS 57 08/22/2014 1130   BILITOT 0.3 08/22/2014 1130   GFRNONAA >60 11/13/2020 0811   GFRAA 79 12/09/2019 1052   Lab Results  Component Value Date   CHOL 215 (H) 11/14/2020   HDL 58 11/14/2020   LDLCALC 134 (H) 11/14/2020   TRIG 116 11/14/2020   CHOLHDL 3.7 11/14/2020   Lab Results  Component Value Date   HGBA1C 5.2 11/14/2020   Lab Results  Component Value Date   VITAMINB12 283 08/05/2023   Lab Results  Component Value Date  TSH 2.602 11/13/2020      ASSESSMENT AND PLAN 62 y.o. year old female  has a past medical history of Anxiety, Arthritis, Back pain, Chronic rhinitis, Complication of anesthesia, Depression, Dysrhythmia, Family history of colonic polyps, Hepatic hemangioma, Hyperlipidemia, IBS (irritable bowel syndrome), Insomnia, Migraine, Osteoarthritis, foot, localized, Perimenopause, Peripheral neuropathy, PONV (postoperative nausea and vomiting), Raynauds syndrome, Seasonal allergies, and Von Willebrand's disease (HCC). here with:  1.  Migraine headaches 2.  Hot flashes  Patient will continue naratriptan for abortive therapy for now. Continue gabapentin 300 mg  3 times a day   Follow-up in 1 year or sooner if needed    Butch Penny, MSN, NP-C 10/06/2023, 10:11 AM [facility] [address] [place], [place] [address] [phone]

## 2023-10-11 ENCOUNTER — Other Ambulatory Visit (HOSPITAL_COMMUNITY): Payer: Self-pay

## 2023-10-19 ENCOUNTER — Other Ambulatory Visit: Payer: Self-pay

## 2023-10-19 ENCOUNTER — Other Ambulatory Visit (HOSPITAL_COMMUNITY): Payer: Self-pay

## 2023-10-22 DIAGNOSIS — M1712 Unilateral primary osteoarthritis, left knee: Secondary | ICD-10-CM | POA: Diagnosis not present

## 2023-10-28 ENCOUNTER — Other Ambulatory Visit (HOSPITAL_COMMUNITY): Payer: Self-pay

## 2023-10-28 ENCOUNTER — Telehealth: Payer: 59 | Admitting: Physician Assistant

## 2023-10-28 DIAGNOSIS — R3989 Other symptoms and signs involving the genitourinary system: Secondary | ICD-10-CM | POA: Diagnosis not present

## 2023-10-28 MED ORDER — PHENAZOPYRIDINE HCL 100 MG PO TABS
100.0000 mg | ORAL_TABLET | Freq: Three times a day (TID) | ORAL | 0 refills | Status: DC | PRN
  Filled 2023-10-28: qty 10, 4d supply, fill #0

## 2023-10-28 MED ORDER — NITROFURANTOIN MONOHYD MACRO 100 MG PO CAPS
100.0000 mg | ORAL_CAPSULE | Freq: Two times a day (BID) | ORAL | 0 refills | Status: DC
  Filled 2023-10-28: qty 10, 5d supply, fill #0

## 2023-10-28 NOTE — Patient Instructions (Signed)
 [name], thank you for joining Angelia Kelp, PA-C for today's virtual visit.  While this provider is not your primary care provider (PCP), if your PCP is located in our provider database this encounter information will be shared with them immediately following your visit.   A [facility] MyChart account gives you access to today's visit and all your visits, tests, and labs performed at [facility] " click here if you don't have a [facility] MyChart account or go to [email or web address]  Consent: (Patient) [name] provided verbal consent for this virtual visit at the beginning of the encounter.  Current Medications:  Current Outpatient Medications:    nitrofurantoin , macrocrystal-monohydrate, (MACROBID ) 100 MG capsule, Take 1 capsule (100 mg total) by mouth 2 (two) times daily., Disp: 10 capsule, Rfl: 0   phenazopyridine  (PYRIDIUM ) 100 MG tablet, Take 1 tablet (100 mg total) by mouth 3 (three) times daily as needed for pain., Disp: 10 tablet, Rfl: 0   buPROPion  (WELLBUTRIN  XL) 150 MG 24 hr tablet, Take 1 tablet (150 mg total) by mouth daily., Disp: 90 tablet, Rfl: 1   cholecalciferol  (VITAMIN D3) 25 MCG (1000 UNIT) tablet, Take 1,000 Units by mouth daily., Disp: , Rfl:    Coenzyme Q10 (CO Q-10) 50 MG CAPS, Take by mouth., Disp: , Rfl:    conjugated estrogens  (PREMARIN ) vaginal cream, Insert 0.5 g vaginally nightly for 2 weeks then use 2-3 times a week, Disp: 30 g, Rfl: 1   dicyclomine  (BENTYL ) 10 MG capsule, Take 1 capsule (10 mg total) by mouth 3 (three) times daily as needed for spasms., Disp: 90 capsule, Rfl: 0   fluconazole  (DIFLUCAN ) 150 MG tablet, Take 1 tablet (150 mg total) by mouth as needed for candidiasis flareup., Disp: 15 tablet, Rfl: 1   gabapentin  (NEURONTIN ) 300 MG capsule, Take 1 capsule (300 mg total) by mouth 3 (three) times daily., Disp: 90 capsule, Rfl: 11   Multiple Vitamins-Minerals (MULTIPLE VITAMINS/WOMENS) tablet, Take 1  tablet by mouth daily., Disp: , Rfl:    naratriptan  (AMERGE) 2.5 MG tablet, Take one tablet by mouth at onset of headache; if returns or does not resolve, may repeat after 2-4 hours; do not exceed two tabs in 24 hours., Disp: 9 tablet, Rfl: 3   nystatin  cream (MYCOSTATIN ), Apply 1 Application topically 2 (two) times daily. (Patient not taking: Reported on 10/06/2023), Disp: 60 g, Rfl: 1   Probiotic Product (UP4 PROBIOTICS PO), Take by mouth., Disp: , Rfl:    traZODone  (DESYREL ) 50 MG tablet, Take 1-2 tablets (50-100 mg total) by mouth at bedtime., Disp: 60 tablet, Rfl: 5   Medications ordered in this encounter:  Meds ordered this encounter  Medications   nitrofurantoin , macrocrystal-monohydrate, (MACROBID ) 100 MG capsule    Sig: Take 1 capsule (100 mg total) by mouth 2 (two) times daily.    Dispense:  10 capsule    Refill:  0    Supervising Provider:   LAMPTEY, PHILIP O [[id number]]   phenazopyridine  (PYRIDIUM ) 100 MG tablet    Sig: Take 1 tablet (100 mg total) by mouth 3 (three) times daily as needed for pain.    Dispense:  10 tablet    Refill:  0    Supervising Provider:   Corine Dice [[id number]]     *If you need refills on other medications prior to your next appointment, please contact your pharmacy*  Follow-Up: Call back or seek an in-person evaluation if the symptoms worsen or if the condition fails to  improve as anticipated.  [facility] [phone]  Other Instructions Urinary Tract Infection, Adult  A urinary tract infection (UTI) is an infection of any part of the urinary tract. The urinary tract includes the kidneys, ureters, bladder, and urethra. These organs make, store, and get rid of urine in the body. An upper UTI affects the ureters and kidneys. A lower UTI affects the bladder and urethra. What are the causes? Most urinary tract infections are caused by bacteria in your genital area around your urethra, where urine leaves your body. These bacteria  grow and cause inflammation of your urinary tract. What increases the risk? You are more likely to develop this condition if: You have a urinary catheter that stays in place. You are not able to control when you urinate or have a bowel movement (incontinence). You are female and you: Use a spermicide or diaphragm for birth control. Have low estrogen levels. Are pregnant. You have certain genes that increase your risk. You are sexually active. You take antibiotic medicines. You have a condition that causes your flow of urine to slow down, such as: An enlarged prostate, if you are female. Blockage in your urethra. A kidney stone. A nerve condition that affects your bladder control (neurogenic bladder). Not getting enough to drink, or not urinating often. You have certain medical conditions, such as: Diabetes. A weak disease-fighting system (immunesystem). Sickle cell disease. Gout. Spinal cord injury. What are the signs or symptoms? Symptoms of this condition include: Needing to urinate right away (urgency). Frequent urination. This may include small amounts of urine each time you urinate. Pain or burning with urination. Blood in the urine. Urine that smells bad or unusual. Trouble urinating. Cloudy urine. Vaginal discharge, if you are female. Pain in the abdomen or the lower back. You may also have: Vomiting or a decreased appetite. Confusion. Irritability or tiredness. A fever or chills. Diarrhea. The first symptom in older adults may be confusion. In some cases, they may not have any symptoms until the infection has worsened. How is this diagnosed? This condition is diagnosed based on your medical history and a physical exam. You may also have other tests, including: Urine tests. Blood tests. Tests for STIs (sexually transmitted infections). If you have had more than one UTI, a cystoscopy or imaging studies may be done to determine the cause of the infections. How is  this treated? Treatment for this condition includes: Antibiotic medicine. Over-the-counter medicines to treat discomfort. Drinking enough water to stay hydrated. If you have frequent infections or have other conditions such as a kidney stone, you may need to see a health care provider who specializes in the urinary tract (urologist). In rare cases, urinary tract infections can cause sepsis. Sepsis is a life-threatening condition that occurs when the body responds to an infection. Sepsis is treated in the hospital with IV antibiotics, fluids, and other medicines. Follow these instructions at home:  Medicines Take over-the-counter and prescription medicines only as told by your health care provider. If you were prescribed an antibiotic medicine, take it as told by your health care provider. Do not stop using the antibiotic even if you start to feel better. General instructions Make sure you: Empty your bladder often and completely. Do not hold urine for long periods of time. Empty your bladder after sex. Wipe from front to back after urinating or having a bowel movement if you are female. Use each tissue only one time when you wipe. Drink enough fluid to keep  your urine pale yellow. Keep all follow-up visits. This is important. Contact a health care provider if: Your symptoms do not get better after 1-2 days. Your symptoms go away and then return. Get help right away if: You have severe pain in your back or your lower abdomen. You have a fever or chills. You have nausea or vomiting. Summary A urinary tract infection (UTI) is an infection of any part of the urinary tract, which includes the kidneys, ureters, bladder, and urethra. Most urinary tract infections are caused by bacteria in your genital area. Treatment for this condition often includes antibiotic medicines. If you were prescribed an antibiotic medicine, take it as told by your health care provider. Do not stop using the  antibiotic even if you start to feel better. Keep all follow-up visits. This is important. This information is not intended to replace advice given to you by your health care provider. Make sure you discuss any questions you have with your health care provider. Document Revised: 05/06/2020 Document Reviewed: 05/11/2020 Elsevier Patient Education  2024 Elsevier Inc.    If you have been instructed to have an in-person evaluation today at a local Urgent Care facility, please use the link below. It will take you to a list of all of our available [facility], including address, phone number and hours of operation. Please do not delay care.  [facility]  If you or a family member do not have a primary care provider, use the link below to schedule a visit and establish care. When you choose a [facility] primary care physician or advanced practice provider, you gain a long-term partner in health. Find a Primary Care Provider  Learn more about [facility]'s in-office and virtual care options: [facility] - Get Care Now

## 2023-10-28 NOTE — Progress Notes (Signed)
 Virtual Visit Consent   [name], you are scheduled for a virtual visit with a [facility] provider today. Just as with appointments in the office, your consent must be obtained to participate. Your consent will be active for this visit and any virtual visit you may have with one of our providers in the next 365 days. If you have a MyChart account, a copy of this consent can be sent to you electronically.  As this is a virtual visit, video technology does not allow for your provider to perform a traditional examination. This may limit your provider's ability to fully assess your condition. If your provider identifies any concerns that need to be evaluated in person or the need to arrange testing (such as labs, EKG, etc.), we will make arrangements to do so. Although advances in technology are sophisticated, we cannot ensure that it will always work on either your end or our end. If the connection with a video visit is poor, the visit may have to be switched to a telephone visit. With either a video or telephone visit, we are not always able to ensure that we have a secure connection.  By engaging in this virtual visit, you consent to the provision of healthcare and authorize for your insurance to be billed (if applicable) for the services provided during this visit. Depending on your insurance coverage, you may receive a charge related to this service.  I need to obtain your verbal consent now. Are you willing to proceed with your visit today? [name] has provided verbal consent on 10/28/2023 for a virtual visit (video or telephone). Angelia Kelp, PA-C  Date: 10/28/2023 4:19 PM  Virtual Visit via Video Note   I, Angelia Kelp, connected with  [name]  ([id number], 17-Aug-1961) on 10/28/23 at  4:15 PM EST by a video-enabled telemedicine application and verified that I am speaking with the correct person using two identifiers.  Location: Patient: Virtual Visit  Location Patient: Mobile Provider: Virtual Visit Location Provider: Home Office   I discussed the limitations of evaluation and management by telemedicine and the availability of in person appointments. The patient expressed understanding and agreed to proceed.    History of Present Illness: [name] is a 63 y.o. who identifies as a female who was assigned female at birth, and is being seen today for possible UTI.  HPI: Urinary Tract Infection  This is a new problem. The current episode started in the past 7 days (Monday). The problem occurs every urination. The problem has been gradually worsening. The quality of the pain is described as burning. There has been no fever. Associated symptoms include frequency, hesitancy and urgency. Pertinent negatives include no chills, discharge, flank pain, hematuria or nausea. She has tried increased fluids and NSAIDs (AZO) for the symptoms. The treatment provided no relief. Her past medical history is significant for recurrent UTIs.     Problems:  Patient Active Problem List   Diagnosis Date Noted   Arthritis of left foot 08/04/2023   Crohn's disease of ileum without complication (HCC) 08/04/2023   Dry mouth 08/04/2023   Hx of systemic candidiasis 08/04/2023   Left foot pain 08/04/2023   Postmenopausal 08/04/2023   Primary osteoarthritis of left knee 08/04/2023   PVC (premature ventricular contraction) 08/04/2023   Recurrent major depressive disorder, in partial remission (HCC) 08/04/2023   Von Willebrand disease (HCC) 08/04/2023   Migraine without aura and without status migrainosus, not intractable 08/04/2023  OSA (obstructive sleep apnea) 08/04/2023   Change in bowel habit 06/04/2023   RLQ abdominal pain 06/04/2023   Complication associated with orthopedic device (HCC) 02/11/2023   Depressive disorder 09/03/2022   Supraventricular tachycardia (HCC) 09/03/2022   Acquired short Achilles tendon of left lower extremity 08/08/2022    Tear of medial meniscus of knee 06/02/2022   Chronic knee pain after total replacement of left knee joint 03/03/2022   OSA on CPAP 12/12/2021   Menopausal syndrome (hot flashes) 12/12/2021   Morning headache, controlled 07/29/2021   Chronic insomnia 07/29/2021   Moderate obstructive sleep apnea-hypopnea syndrome 07/29/2021   Chronic migraine without aura with status migrainosus, not intractable 01/07/2021   Migraine with aura and with status migrainosus, not intractable 11/20/2020   TIA (transient ischemic attack) 11/13/2020   Sinus tachycardia 12/09/2019   Hypertension 12/09/2019   Tachycardia 11/24/2019    Allergies:  Allergies  Allergen Reactions   Sulfa Antibiotics Other (See Comments)    Unknown reaction - childhood allergy   Ciprofloxacin      Tendonitis    Levaquin [Levofloxacin In D5w] Other (See Comments)    Tendonitis in shoulder, severe yeast infections   Medications:  Current Outpatient Medications:    nitrofurantoin , macrocrystal-monohydrate, (MACROBID ) 100 MG capsule, Take 1 capsule (100 mg total) by mouth 2 (two) times daily., Disp: 10 capsule, Rfl: 0   phenazopyridine  (PYRIDIUM ) 100 MG tablet, Take 1 tablet (100 mg total) by mouth 3 (three) times daily as needed for pain., Disp: 10 tablet, Rfl: 0   buPROPion  (WELLBUTRIN  XL) 150 MG 24 hr tablet, Take 1 tablet (150 mg total) by mouth daily., Disp: 90 tablet, Rfl: 1   cholecalciferol  (VITAMIN D3) 25 MCG (1000 UNIT) tablet, Take 1,000 Units by mouth daily., Disp: , Rfl:    Coenzyme Q10 (CO Q-10) 50 MG CAPS, Take by mouth., Disp: , Rfl:    conjugated estrogens  (PREMARIN ) vaginal cream, Insert 0.5 g vaginally nightly for 2 weeks then use 2-3 times a week, Disp: 30 g, Rfl: 1   dicyclomine  (BENTYL ) 10 MG capsule, Take 1 capsule (10 mg total) by mouth 3 (three) times daily as needed for spasms., Disp: 90 capsule, Rfl: 0   fluconazole  (DIFLUCAN ) 150 MG tablet, Take 1 tablet (150 mg total) by mouth as needed for candidiasis  flareup., Disp: 15 tablet, Rfl: 1   gabapentin  (NEURONTIN ) 300 MG capsule, Take 1 capsule (300 mg total) by mouth 3 (three) times daily., Disp: 90 capsule, Rfl: 11   Multiple Vitamins-Minerals (MULTIPLE VITAMINS/WOMENS) tablet, Take 1 tablet by mouth daily., Disp: , Rfl:    naratriptan  (AMERGE) 2.5 MG tablet, Take one tablet by mouth at onset of headache; if returns or does not resolve, may repeat after 2-4 hours; do not exceed two tabs in 24 hours., Disp: 9 tablet, Rfl: 3   nystatin  cream (MYCOSTATIN ), Apply 1 Application topically 2 (two) times daily. (Patient not taking: Reported on 10/06/2023), Disp: 60 g, Rfl: 1   Probiotic Product (UP4 PROBIOTICS PO), Take by mouth., Disp: , Rfl:    traZODone  (DESYREL ) 50 MG tablet, Take 1-2 tablets (50-100 mg total) by mouth at bedtime., Disp: 60 tablet, Rfl: 5  Observations/Objective: Patient is well-developed, well-nourished in no acute distress.  Resting comfortably  Head is normocephalic, atraumatic.  No labored breathing.  Speech is clear and coherent with logical content.  Patient is alert and oriented at baseline.    Assessment and Plan: 1. Suspected UTI (Primary) - nitrofurantoin , macrocrystal-monohydrate, (MACROBID ) 100 MG capsule; Take 1 capsule (  100 mg total) by mouth 2 (two) times daily.  Dispense: 10 capsule; Refill: 0 - phenazopyridine  (PYRIDIUM ) 100 MG tablet; Take 1 tablet (100 mg total) by mouth 3 (three) times daily as needed for pain.  Dispense: 10 tablet; Refill: 0  - Worsening symptoms.  - Will treat empirically with Macrobid  - May use Pyridium  for bladder spasms - Continue to push fluids.  - Seek in person evaluation for urine culture if symptoms do not improve or if they worsen.    Follow Up Instructions: I discussed the assessment and treatment plan with the patient. The patient was provided an opportunity to ask questions and all were answered. The patient agreed with the plan and demonstrated an understanding of the  instructions.  A copy of instructions were sent to the patient via MyChart unless otherwise noted below.    The patient was advised to call back or seek an in-person evaluation if the symptoms worsen or if the condition fails to improve as anticipated.    Angelia Kelp, PA-C

## 2023-11-04 ENCOUNTER — Ambulatory Visit: Payer: 59 | Admitting: Infectious Disease

## 2023-11-12 ENCOUNTER — Other Ambulatory Visit (HOSPITAL_COMMUNITY): Payer: Self-pay

## 2023-11-17 ENCOUNTER — Other Ambulatory Visit: Payer: Self-pay

## 2023-12-11 ENCOUNTER — Other Ambulatory Visit: Payer: Self-pay

## 2023-12-23 ENCOUNTER — Other Ambulatory Visit (HOSPITAL_COMMUNITY): Payer: Self-pay

## 2024-01-12 ENCOUNTER — Other Ambulatory Visit: Payer: Self-pay | Admitting: Neurology

## 2024-01-12 ENCOUNTER — Other Ambulatory Visit (HOSPITAL_COMMUNITY): Payer: Self-pay

## 2024-01-12 ENCOUNTER — Other Ambulatory Visit: Payer: Self-pay

## 2024-01-12 MED ORDER — TRAZODONE HCL 50 MG PO TABS
50.0000 mg | ORAL_TABLET | Freq: Every day | ORAL | 5 refills | Status: AC
Start: 2024-01-12 — End: ?
  Filled 2024-01-12: qty 60, 30d supply, fill #0
  Filled 2024-03-19: qty 60, 30d supply, fill #1
  Filled 2024-04-17: qty 60, 30d supply, fill #2
  Filled 2024-05-15 – 2024-05-16 (×2): qty 60, 30d supply, fill #3
  Filled 2024-07-20: qty 60, 30d supply, fill #4
  Filled 2024-08-19: qty 60, 30d supply, fill #5

## 2024-01-18 ENCOUNTER — Telehealth: Admitting: Physician Assistant

## 2024-01-18 DIAGNOSIS — R3989 Other symptoms and signs involving the genitourinary system: Secondary | ICD-10-CM | POA: Diagnosis not present

## 2024-01-18 MED ORDER — NITROFURANTOIN MONOHYD MACRO 100 MG PO CAPS
100.0000 mg | ORAL_CAPSULE | Freq: Two times a day (BID) | ORAL | 0 refills | Status: AC
Start: 2024-01-18 — End: ?

## 2024-01-18 MED ORDER — PHENAZOPYRIDINE HCL 100 MG PO TABS: 100.0000 mg | ORAL_TABLET | Freq: Three times a day (TID) | ORAL | 0 refills | Status: AC | PRN

## 2024-01-18 NOTE — Progress Notes (Signed)
 Virtual Visit Consent   [name], you are scheduled for a virtual visit with a [facility] provider today. Just as with appointments in the office, your consent must be obtained to participate. Your consent will be active for this visit and any virtual visit you may have with one of our providers in the next 365 days. If you have a MyChart account, a copy of this consent can be sent to you electronically.  As this is a virtual visit, video technology does not allow for your provider to perform a traditional examination. This may limit your provider's ability to fully assess your condition. If your provider identifies any concerns that need to be evaluated in person or the need to arrange testing (such as labs, EKG, etc.), we will make arrangements to do so. Although advances in technology are sophisticated, we cannot ensure that it will always work on either your end or our end. If the connection with a video visit is poor, the visit may have to be switched to a telephone visit. With either a video or telephone visit, we are not always able to ensure that we have a secure connection.  By engaging in this virtual visit, you consent to the provision of healthcare and authorize for your insurance to be billed (if applicable) for the services provided during this visit. Depending on your insurance coverage, you may receive a charge related to this service.  I need to obtain your verbal consent now. Are you willing to proceed with your visit today? [name] has provided verbal consent on 01/18/2024 for a virtual visit (video or telephone). Margaretann Loveless, PA-C  Date: 01/18/2024 10:17 AM   Virtual Visit via Video Note   I, Margaretann Loveless, connected with  [name]  ([id number], 03/19/1961) on 01/18/24 at 10:15 AM EDT by a video-enabled telemedicine application and verified that I am speaking with the correct person using two identifiers.  Location: Patient: Virtual  Visit Location Patient: Home Provider: Virtual Visit Location Provider: Home Office   I discussed the limitations of evaluation and management by telemedicine and the availability of in person appointments. The patient expressed understanding and agreed to proceed.    History of Present Illness: [name] is a 63 y.o. who identifies as a female who was assigned female at birth, and is being seen today for UTI symptoms.  HPI: Urinary Tract Infection  This is a new problem. The current episode started yesterday. The problem occurs every urination. The problem has been gradually worsening. The quality of the pain is described as aching and burning. The pain is mild. There has been no fever. Associated symptoms include frequency, hesitancy and urgency. Pertinent negatives include no chills, discharge, flank pain, hematuria, nausea or vomiting. She has tried nothing for the symptoms. The treatment provided no relief.     Problems:  Patient Active Problem List   Diagnosis Date Noted   Arthritis of left foot 08/04/2023   Crohn's disease of ileum without complication (HCC) 08/04/2023   Dry mouth 08/04/2023   Hx of systemic candidiasis 08/04/2023   Left foot pain 08/04/2023   Postmenopausal 08/04/2023   Primary osteoarthritis of left knee 08/04/2023   PVC (premature ventricular contraction) 08/04/2023   Recurrent major depressive disorder, in partial remission (HCC) 08/04/2023   Von Willebrand disease (HCC) 08/04/2023   Migraine without aura and without status migrainosus, not intractable 08/04/2023   OSA (obstructive sleep apnea) 08/04/2023   Change in bowel habit  06/04/2023   RLQ abdominal pain 06/04/2023   Complication associated with orthopedic device (HCC) 02/11/2023   Depressive disorder 09/03/2022   Supraventricular tachycardia (HCC) 09/03/2022   Acquired short Achilles tendon of left lower extremity 08/08/2022   Tear of medial meniscus of knee 06/02/2022   Chronic knee pain  after total replacement of left knee joint 03/03/2022   OSA on CPAP 12/12/2021   Menopausal syndrome (hot flashes) 12/12/2021   Morning headache, controlled 07/29/2021   Chronic insomnia 07/29/2021   Moderate obstructive sleep apnea-hypopnea syndrome 07/29/2021   Chronic migraine without aura with status migrainosus, not intractable 01/07/2021   Migraine with aura and with status migrainosus, not intractable 11/20/2020   TIA (transient ischemic attack) 11/13/2020   Sinus tachycardia 12/09/2019   Hypertension 12/09/2019   Tachycardia 11/24/2019    Allergies:  Allergies  Allergen Reactions   Sulfa Antibiotics Other (See Comments)    Unknown reaction - childhood allergy   Ciprofloxacin     Tendonitis    Levaquin [Levofloxacin In D5w] Other (See Comments)    Tendonitis in shoulder, severe yeast infections   Medications:  Current Outpatient Medications:    nitrofurantoin, macrocrystal-monohydrate, (MACROBID) 100 MG capsule, Take 1 capsule (100 mg total) by mouth 2 (two) times daily., Disp: 10 capsule, Rfl: 0   phenazopyridine (PYRIDIUM) 100 MG tablet, Take 1 tablet (100 mg total) by mouth 3 (three) times daily as needed for pain., Disp: 10 tablet, Rfl: 0   buPROPion (WELLBUTRIN XL) 150 MG 24 hr tablet, Take 1 tablet (150 mg total) by mouth daily., Disp: 90 tablet, Rfl: 1   cholecalciferol (VITAMIN D3) 25 MCG (1000 UNIT) tablet, Take 1,000 Units by mouth daily., Disp: , Rfl:    Coenzyme Q10 (CO Q-10) 50 MG CAPS, Take by mouth., Disp: , Rfl:    conjugated estrogens (PREMARIN) vaginal cream, Insert 0.5 g vaginally nightly for 2 weeks then use 2-3 times a week, Disp: 30 g, Rfl: 1   dicyclomine (BENTYL) 10 MG capsule, Take 1 capsule (10 mg total) by mouth 3 (three) times daily as needed for spasms., Disp: 90 capsule, Rfl: 0   fluconazole (DIFLUCAN) 150 MG tablet, Take 1 tablet (150 mg total) by mouth as needed for candidiasis flareup., Disp: 15 tablet, Rfl: 1   gabapentin (NEURONTIN) 300 MG  capsule, Take 1 capsule (300 mg total) by mouth 3 (three) times daily., Disp: 90 capsule, Rfl: 11   Multiple Vitamins-Minerals (MULTIPLE VITAMINS/WOMENS) tablet, Take 1 tablet by mouth daily., Disp: , Rfl:    naratriptan (AMERGE) 2.5 MG tablet, Take one tablet by mouth at onset of headache; if returns or does not resolve, may repeat after 2-4 hours; do not exceed two tabs in 24 hours., Disp: 9 tablet, Rfl: 3   nystatin cream (MYCOSTATIN), Apply 1 Application topically 2 (two) times daily. (Patient not taking: Reported on 10/06/2023), Disp: 60 g, Rfl: 1   Probiotic Product (UP4 PROBIOTICS PO), Take by mouth., Disp: , Rfl:    traZODone (DESYREL) 50 MG tablet, Take 1-2 tablets (50-100 mg total) by mouth at bedtime., Disp: 60 tablet, Rfl: 5  Observations/Objective: Patient is well-developed, well-nourished in no acute distress.  Resting comfortably at home.  Head is normocephalic, atraumatic.  No labored breathing.  Speech is clear and coherent with logical content.  Patient is alert and oriented at baseline.    Assessment and Plan: 1. Suspected UTI (Primary) - nitrofurantoin, macrocrystal-monohydrate, (MACROBID) 100 MG capsule; Take 1 capsule (100 mg total) by mouth 2 (two) times daily.  Dispense: 10 capsule; Refill: 0 - phenazopyridine (PYRIDIUM) 100 MG tablet; Take 1 tablet (100 mg total) by mouth 3 (three) times daily as needed for pain.  Dispense: 10 tablet; Refill: 0  - Worsening symptoms.  - Will treat empirically with Macrobid - May use Pyridium for bladder spasms - Continue to push fluids.  - Seek in person evaluation for urine culture if symptoms do not improve or if they worsen.    Follow Up Instructions: I discussed the assessment and treatment plan with the patient. The patient was provided an opportunity to ask questions and all were answered. The patient agreed with the plan and demonstrated an understanding of the instructions.  A copy of instructions were sent to the patient  via MyChart unless otherwise noted below.    The patient was advised to call back or seek an in-person evaluation if the symptoms worsen or if the condition fails to improve as anticipated.    Margaretann Loveless, PA-C

## 2024-01-18 NOTE — Patient Instructions (Signed)
 Briant Sites, thank you for joining Margaretann Loveless, PA-C for today's virtual visit.  While this provider is not your primary care provider (PCP), if your PCP is located in our provider database this encounter information will be shared with them immediately following your visit.   A [facility] MyChart account gives you access to today's visit and all your visits, tests, and labs performed at [facility] " click here if you don't have a  MyChart account or go to [email or web address]  Consent: (Patient) [name] provided verbal consent for this virtual visit at the beginning of the encounter.  Current Medications:  Current Outpatient Medications:    nitrofurantoin, macrocrystal-monohydrate, (MACROBID) 100 MG capsule, Take 1 capsule (100 mg total) by mouth 2 (two) times daily., Disp: 10 capsule, Rfl: 0   phenazopyridine (PYRIDIUM) 100 MG tablet, Take 1 tablet (100 mg total) by mouth 3 (three) times daily as needed for pain., Disp: 10 tablet, Rfl: 0   buPROPion (WELLBUTRIN XL) 150 MG 24 hr tablet, Take 1 tablet (150 mg total) by mouth daily., Disp: 90 tablet, Rfl: 1   cholecalciferol (VITAMIN D3) 25 MCG (1000 UNIT) tablet, Take 1,000 Units by mouth daily., Disp: , Rfl:    Coenzyme Q10 (CO Q-10) 50 MG CAPS, Take by mouth., Disp: , Rfl:    conjugated estrogens (PREMARIN) vaginal cream, Insert 0.5 g vaginally nightly for 2 weeks then use 2-3 times a week, Disp: 30 g, Rfl: 1   dicyclomine (BENTYL) 10 MG capsule, Take 1 capsule (10 mg total) by mouth 3 (three) times daily as needed for spasms., Disp: 90 capsule, Rfl: 0   fluconazole (DIFLUCAN) 150 MG tablet, Take 1 tablet (150 mg total) by mouth as needed for candidiasis flareup., Disp: 15 tablet, Rfl: 1   gabapentin (NEURONTIN) 300 MG capsule, Take 1 capsule (300 mg total) by mouth 3 (three) times daily., Disp: 90 capsule, Rfl: 11   Multiple Vitamins-Minerals (MULTIPLE VITAMINS/WOMENS) tablet, Take 1  tablet by mouth daily., Disp: , Rfl:    naratriptan (AMERGE) 2.5 MG tablet, Take one tablet by mouth at onset of headache; if returns or does not resolve, may repeat after 2-4 hours; do not exceed two tabs in 24 hours., Disp: 9 tablet, Rfl: 3   nystatin cream (MYCOSTATIN), Apply 1 Application topically 2 (two) times daily. (Patient not taking: Reported on 10/06/2023), Disp: 60 g, Rfl: 1   Probiotic Product (UP4 PROBIOTICS PO), Take by mouth., Disp: , Rfl:    traZODone (DESYREL) 50 MG tablet, Take 1-2 tablets (50-100 mg total) by mouth at bedtime., Disp: 60 tablet, Rfl: 5   Medications ordered in this encounter:  Meds ordered this encounter  Medications   nitrofurantoin, macrocrystal-monohydrate, (MACROBID) 100 MG capsule    Sig: Take 1 capsule (100 mg total) by mouth 2 (two) times daily.    Dispense:  10 capsule    Refill:  0    Supervising Provider:   Merrilee Jansky [[id number]]   phenazopyridine (PYRIDIUM) 100 MG tablet    Sig: Take 1 tablet (100 mg total) by mouth 3 (three) times daily as needed for pain.    Dispense:  10 tablet    Refill:  0    Supervising Provider:   Merrilee Jansky [[id number]]     *If you need refills on other medications prior to your next appointment, please contact your pharmacy*  Follow-Up: Call back or seek an in-person evaluation if the symptoms worsen or if the condition fails to  improve as anticipated.  [facility] [phone]  Other Instructions Urinary Tract Infection, Female A urinary tract infection (UTI) is an infection in your urinary tract. The urinary tract is made up of organs that make, store, and get rid of pee (urine) in your body. These organs include: The kidneys. The ureters. The bladder. The urethra. What are the causes? Most UTIs are caused by germs called bacteria. They may be in or near your genitals. These germs grow and cause swelling in your urinary tract. What increases the risk? You're more likely to get a  UTI if: You're a female. The urethra is shorter in females than in males. You have a soft tube called a catheter that drains your pee. You can't control when you pee or poop. You have trouble peeing because of: A kidney stone. A urinary blockage. A nerve condition that affects your bladder. Not getting enough to drink. You're sexually active. You use a birth control inside your vagina, like spermicide. You're pregnant. You have low levels of the hormone estrogen in your body. You're an older adult. You're also more likely to get a UTI if you have other health problems. These may include: Diabetes. A weak immune system. Your immune system is your body's defense system. Sickle cell disease. Injury of the spine. What are the signs or symptoms? Symptoms may include: Needing to pee right away. Peeing small amounts often. Pain or burning when you pee. Blood in your pee. Pee that smells bad or odd. Pain in your belly or lower back. You may also: Feel confused. This may be the first symptom in older adults. Vomit. Not feel hungry. Feel tired or easily annoyed. Have a fever or chills. How is this diagnosed? A UTI is diagnosed based on your medical history and an exam. You may also have other tests. These may include: Pee tests. Blood tests. Tests for sexually transmitted infections (STIs). If you've had more than one UTI, you may need to have imaging studies done to find out why you keep getting them. How is this treated? A UTI can be treated by: Taking antibiotics or other medicines. Drinking enough fluid to keep your pee pale yellow. In rare cases, a UTI can cause a very bad condition called sepsis. Sepsis may be treated in the hospital. Follow these instructions at home: Medicines Take your medicines only as told by your health care provider. If you were given antibiotics, take them as told by your provider. Do not stop taking them even if you start to feel better. General  instructions Make sure you: Pee often and fully. Do not hold your pee for a long time. Wipe from front to back after you pee or poop. Use each tissue only once when you wipe. Pee after you have sex. Do not douche or use sprays or powders in your genital area. Contact a health care provider if: Your symptoms don't get better after 1-2 days of taking antibiotics. Your symptoms go away and then come back. You have a fever or chills. You vomit or feel like you may vomit. Get help right away if: You have very bad pain in your back or lower belly. You faint. This information is not intended to replace advice given to you by your health care provider. Make sure you discuss any questions you have with your health care provider. Document Revised: 05/07/2023 Document Reviewed: 01/02/2023 Elsevier Patient Education  2024 [employer]   If you have been instructed to have  an in-person evaluation today at a local Urgent Care facility, please use the link below. It will take you to a list of all of our available [facility], including address, phone number and hours of operation. Please do not delay care.  [facility]  If you or a family member do not have a primary care provider, use the link below to schedule a visit and establish care. When you choose a [employer] primary care physician or advanced practice provider, you gain a long-term partner in health. Find a Primary Care Provider  Learn more about [facility]'s in-office and virtual care options: [facility] Get Care Now

## 2024-02-11 DIAGNOSIS — M1712 Unilateral primary osteoarthritis, left knee: Secondary | ICD-10-CM | POA: Diagnosis not present

## 2024-03-02 DIAGNOSIS — Z01419 Encounter for gynecological examination (general) (routine) without abnormal findings: Secondary | ICD-10-CM | POA: Diagnosis not present

## 2024-03-02 DIAGNOSIS — Z6827 Body mass index (BMI) 27.0-27.9, adult: Secondary | ICD-10-CM | POA: Diagnosis not present

## 2024-03-03 ENCOUNTER — Other Ambulatory Visit (HOSPITAL_COMMUNITY): Payer: Self-pay

## 2024-03-03 ENCOUNTER — Other Ambulatory Visit: Payer: Self-pay

## 2024-03-03 DIAGNOSIS — D225 Melanocytic nevi of trunk: Secondary | ICD-10-CM | POA: Diagnosis not present

## 2024-03-03 DIAGNOSIS — L820 Inflamed seborrheic keratosis: Secondary | ICD-10-CM | POA: Diagnosis not present

## 2024-03-03 DIAGNOSIS — D2261 Melanocytic nevi of right upper limb, including shoulder: Secondary | ICD-10-CM | POA: Diagnosis not present

## 2024-03-03 DIAGNOSIS — D2272 Melanocytic nevi of left lower limb, including hip: Secondary | ICD-10-CM | POA: Diagnosis not present

## 2024-03-03 DIAGNOSIS — D1801 Hemangioma of skin and subcutaneous tissue: Secondary | ICD-10-CM | POA: Diagnosis not present

## 2024-03-03 DIAGNOSIS — L570 Actinic keratosis: Secondary | ICD-10-CM | POA: Diagnosis not present

## 2024-03-03 MED ORDER — NITROFURANTOIN MONOHYD MACRO 100 MG PO CAPS
100.0000 mg | ORAL_CAPSULE | Freq: Every day | ORAL | 2 refills | Status: AC
Start: 2024-03-02 — End: ?
  Filled 2024-03-03 (×2): qty 30, 30d supply, fill #0
  Filled 2024-04-17: qty 30, 30d supply, fill #1
  Filled 2024-05-15 – 2024-05-16 (×2): qty 30, 30d supply, fill #2

## 2024-03-03 MED ORDER — ESTRADIOL 10 MCG VA TABS
ORAL_TABLET | VAGINAL | 3 refills | Status: AC
  Filled 2024-03-03 – 2024-05-16 (×3): qty 16, 35d supply, fill #0
  Filled 2024-07-20: qty 16, 35d supply, fill #1
  Filled 2024-08-19: qty 16, 35d supply, fill #2
  Filled 2024-09-16: qty 16, 35d supply, fill #3

## 2024-03-04 ENCOUNTER — Other Ambulatory Visit: Payer: Self-pay

## 2024-03-19 ENCOUNTER — Other Ambulatory Visit (HOSPITAL_COMMUNITY): Payer: Self-pay

## 2024-03-21 ENCOUNTER — Other Ambulatory Visit: Payer: Self-pay

## 2024-03-21 ENCOUNTER — Other Ambulatory Visit (HOSPITAL_COMMUNITY): Payer: Self-pay

## 2024-03-21 MED ORDER — BUPROPION HCL ER (XL) 150 MG PO TB24
150.0000 mg | ORAL_TABLET | Freq: Every morning | ORAL | 1 refills | Status: DC
  Filled 2024-03-21: qty 90, 90d supply, fill #0
  Filled 2024-05-15 – 2024-05-16 (×2): qty 90, 90d supply, fill #1

## 2024-04-17 ENCOUNTER — Other Ambulatory Visit (HOSPITAL_COMMUNITY): Payer: Self-pay

## 2024-04-18 ENCOUNTER — Other Ambulatory Visit (HOSPITAL_COMMUNITY): Payer: Self-pay

## 2024-04-19 ENCOUNTER — Other Ambulatory Visit (HOSPITAL_COMMUNITY): Payer: Self-pay

## 2024-04-19 ENCOUNTER — Other Ambulatory Visit: Payer: Self-pay

## 2024-04-19 ENCOUNTER — Encounter: Payer: Self-pay | Admitting: Pharmacist

## 2024-04-19 MED ORDER — FLUCONAZOLE 150 MG PO TABS
150.0000 mg | ORAL_TABLET | ORAL | 1 refills | Status: AC | PRN
  Filled 2024-04-19: qty 15, 15d supply, fill #0

## 2024-04-26 ENCOUNTER — Encounter: Payer: Self-pay | Admitting: Adult Health

## 2024-04-28 ENCOUNTER — Other Ambulatory Visit: Payer: Self-pay

## 2024-04-28 ENCOUNTER — Other Ambulatory Visit (HOSPITAL_COMMUNITY): Payer: Self-pay

## 2024-04-28 MED ORDER — ZOLPIDEM TARTRATE 5 MG PO TABS
5.0000 mg | ORAL_TABLET | Freq: Every evening | ORAL | 0 refills | Status: AC | PRN
  Filled 2024-04-28 – 2024-05-16 (×3): qty 30, 30d supply, fill #0

## 2024-04-28 NOTE — Addendum Note (Signed)
 Addended by: SHONA SAVANT A on: 04/28/2024 03:02 PM   Modules accepted: Orders

## 2024-04-28 NOTE — Telephone Encounter (Signed)
 Please let patient know that Dr. Chalice doesn't mind if I refill but she would like to reduce dose to 5 mg. If patient is amenable I will send in script.

## 2024-05-03 ENCOUNTER — Other Ambulatory Visit (HOSPITAL_COMMUNITY): Payer: Self-pay

## 2024-05-16 ENCOUNTER — Other Ambulatory Visit: Payer: Self-pay

## 2024-05-16 ENCOUNTER — Other Ambulatory Visit (HOSPITAL_COMMUNITY): Payer: Self-pay

## 2024-05-16 MED ORDER — ONDANSETRON HCL 4 MG PO TABS
4.0000 mg | ORAL_TABLET | Freq: Four times a day (QID) | ORAL | 0 refills | Status: AC | PRN
  Filled 2024-05-16 (×2): qty 4, 1d supply, fill #0

## 2024-05-17 ENCOUNTER — Other Ambulatory Visit: Payer: Self-pay

## 2024-05-17 ENCOUNTER — Other Ambulatory Visit (HOSPITAL_COMMUNITY): Payer: Self-pay

## 2024-05-20 ENCOUNTER — Other Ambulatory Visit (HOSPITAL_COMMUNITY): Payer: Self-pay

## 2024-06-15 ENCOUNTER — Other Ambulatory Visit (HOSPITAL_BASED_OUTPATIENT_CLINIC_OR_DEPARTMENT_OTHER): Payer: Self-pay | Admitting: Sports Medicine

## 2024-06-15 DIAGNOSIS — M25551 Pain in right hip: Secondary | ICD-10-CM

## 2024-06-15 DIAGNOSIS — M25562 Pain in left knee: Secondary | ICD-10-CM

## 2024-06-15 DIAGNOSIS — G8929 Other chronic pain: Secondary | ICD-10-CM | POA: Diagnosis not present

## 2024-06-24 ENCOUNTER — Ambulatory Visit (HOSPITAL_BASED_OUTPATIENT_CLINIC_OR_DEPARTMENT_OTHER)
Admission: RE | Admit: 2024-06-24 | Discharge: 2024-06-24 | Disposition: A | Discharge status: Home / Self Care | Source: Ambulatory Visit | Attending: Sports Medicine | Admitting: Sports Medicine

## 2024-06-24 ENCOUNTER — Ambulatory Visit (HOSPITAL_BASED_OUTPATIENT_CLINIC_OR_DEPARTMENT_OTHER)

## 2024-06-24 DIAGNOSIS — M25462 Effusion, left knee: Secondary | ICD-10-CM | POA: Diagnosis not present

## 2024-06-24 DIAGNOSIS — M1712 Unilateral primary osteoarthritis, left knee: Secondary | ICD-10-CM | POA: Diagnosis not present

## 2024-06-24 DIAGNOSIS — M25562 Pain in left knee: Secondary | ICD-10-CM | POA: Insufficient documentation

## 2024-06-24 DIAGNOSIS — M7122 Synovial cyst of popliteal space [Baker], left knee: Secondary | ICD-10-CM | POA: Diagnosis not present

## 2024-06-30 ENCOUNTER — Encounter: Payer: Self-pay | Admitting: Pediatrics

## 2024-06-30 DIAGNOSIS — M170 Bilateral primary osteoarthritis of knee: Secondary | ICD-10-CM | POA: Diagnosis not present

## 2024-06-30 DIAGNOSIS — S83242A Other tear of medial meniscus, current injury, left knee, initial encounter: Secondary | ICD-10-CM | POA: Diagnosis not present

## 2024-06-30 DIAGNOSIS — Z1231 Encounter for screening mammogram for malignant neoplasm of breast: Secondary | ICD-10-CM | POA: Diagnosis not present

## 2024-06-30 DIAGNOSIS — M25551 Pain in right hip: Secondary | ICD-10-CM | POA: Diagnosis not present

## 2024-07-07 DIAGNOSIS — R928 Other abnormal and inconclusive findings on diagnostic imaging of breast: Secondary | ICD-10-CM | POA: Diagnosis not present

## 2024-07-07 DIAGNOSIS — R92321 Mammographic fibroglandular density, right breast: Secondary | ICD-10-CM | POA: Diagnosis not present

## 2024-07-08 DIAGNOSIS — M1712 Unilateral primary osteoarthritis, left knee: Secondary | ICD-10-CM | POA: Diagnosis not present

## 2024-07-19 DIAGNOSIS — G43009 Migraine without aura, not intractable, without status migrainosus: Secondary | ICD-10-CM | POA: Diagnosis not present

## 2024-07-19 DIAGNOSIS — F3341 Major depressive disorder, recurrent, in partial remission: Secondary | ICD-10-CM | POA: Diagnosis not present

## 2024-07-19 DIAGNOSIS — Z8619 Personal history of other infectious and parasitic diseases: Secondary | ICD-10-CM | POA: Diagnosis not present

## 2024-07-19 DIAGNOSIS — E785 Hyperlipidemia, unspecified: Secondary | ICD-10-CM | POA: Diagnosis not present

## 2024-07-19 DIAGNOSIS — G4700 Insomnia, unspecified: Secondary | ICD-10-CM | POA: Diagnosis not present

## 2024-07-19 DIAGNOSIS — Z780 Asymptomatic menopausal state: Secondary | ICD-10-CM | POA: Diagnosis not present

## 2024-07-19 DIAGNOSIS — K5000 Crohn's disease of small intestine without complications: Secondary | ICD-10-CM | POA: Diagnosis not present

## 2024-07-19 DIAGNOSIS — I493 Ventricular premature depolarization: Secondary | ICD-10-CM | POA: Diagnosis not present

## 2024-07-19 DIAGNOSIS — Z0000 Encounter for general adult medical examination without abnormal findings: Secondary | ICD-10-CM | POA: Diagnosis not present

## 2024-07-19 DIAGNOSIS — M1712 Unilateral primary osteoarthritis, left knee: Secondary | ICD-10-CM | POA: Diagnosis not present

## 2024-07-20 ENCOUNTER — Other Ambulatory Visit: Payer: Self-pay | Admitting: Adult Health

## 2024-07-20 ENCOUNTER — Other Ambulatory Visit: Payer: Self-pay

## 2024-07-20 ENCOUNTER — Other Ambulatory Visit (HOSPITAL_COMMUNITY): Payer: Self-pay

## 2024-07-20 MED ORDER — BUPROPION HCL ER (XL) 150 MG PO TB24
150.0000 mg | ORAL_TABLET | Freq: Every morning | ORAL | 0 refills | Status: DC
  Filled 2024-07-20: qty 90, 90d supply, fill #0

## 2024-07-20 MED ORDER — TRAZODONE HCL 50 MG PO TABS
100.0000 mg | ORAL_TABLET | Freq: Every day | ORAL | 0 refills | Status: AC
  Filled 2024-07-20 – 2024-09-16 (×3): qty 180, 90d supply, fill #0

## 2024-07-20 MED ORDER — NARATRIPTAN HCL 2.5 MG PO TABS
2.5000 mg | ORAL_TABLET | ORAL | 3 refills | Status: DC | PRN
  Filled 2024-07-20: qty 9, 27d supply, fill #0
  Filled 2024-09-16: qty 9, 27d supply, fill #1

## 2024-07-21 ENCOUNTER — Other Ambulatory Visit: Payer: Self-pay

## 2024-07-27 ENCOUNTER — Ambulatory Visit (AMBULATORY_SURGERY_CENTER)

## 2024-07-27 ENCOUNTER — Other Ambulatory Visit: Payer: Self-pay

## 2024-07-27 VITALS — Ht 67.0 in | Wt 172.0 lb

## 2024-07-27 DIAGNOSIS — M19072 Primary osteoarthritis, left ankle and foot: Secondary | ICD-10-CM | POA: Diagnosis not present

## 2024-07-27 DIAGNOSIS — K5190 Ulcerative colitis, unspecified, without complications: Secondary | ICD-10-CM

## 2024-07-27 MED ORDER — NA SULFATE-K SULFATE-MG SULF 17.5-3.13-1.6 GM/177ML PO SOLN
1.0000 | Freq: Once | ORAL | 0 refills | Status: AC
  Filled 2024-07-27: qty 354, 1d supply, fill #0

## 2024-07-27 NOTE — Progress Notes (Signed)
 Denies allergies to eggs or soy products. Denies complication of anesthesia or sedation. Denies use of weight loss medication. Denies use of O2.   Emmi instructions given for colonoscopy.

## 2024-07-28 DIAGNOSIS — R748 Abnormal levels of other serum enzymes: Secondary | ICD-10-CM | POA: Diagnosis not present

## 2024-08-19 ENCOUNTER — Other Ambulatory Visit: Payer: Self-pay

## 2024-08-19 ENCOUNTER — Other Ambulatory Visit (HOSPITAL_BASED_OUTPATIENT_CLINIC_OR_DEPARTMENT_OTHER): Payer: Self-pay

## 2024-08-22 ENCOUNTER — Encounter: Payer: Self-pay | Admitting: Pediatrics

## 2024-08-22 NOTE — Progress Notes (Unsigned)
 [facility] History and Physical   Primary Care Physician:  Vernon Velna SAUNDERS, MD   Reason for Procedure:  Follow-up chronic ileitis on colonoscopy 06/2023  Plan:    Colonoscopy   The patient was provided an opportunity to ask questions and all were answered. The patient agreed with the plan.   HPI: [name] is a 63 y.o. female undergoing colonoscopy to follow-up a history of chronic ileitis identified on colonoscopy 05/22/2023.  Patient underwent colonoscopy 1 year ago for change in bowel habits and diarrhea.  A few scattered erosions were noted in the terminal ileum with chronic ileitis on biopsies.  Colon was endoscopically and histologically normal without evidence of microscopic colitis.  2 polyps were removed which were hyperplastic in nature.  Colonoscopy is repeated to evaluate for persistence or resolution of terminal ileal erosions.   Past Medical History:  Diagnosis Date   Allergy    Anxiety    Arthritis    Back pain    Chronic rhinitis    Clotting disorder    Complication of anesthesia    Depression    Dysrhythmia    hx svt- pcp follows   Family history of colonic polyps    Hepatic hemangioma    Hyperlipidemia    IBS (irritable bowel syndrome)    Insomnia    Migraine    Osteoarthritis, foot, localized    left   Perimenopause    Peripheral neuropathy    PONV (postoperative nausea and vomiting)    Raynauds syndrome    Seasonal allergies    Sleep apnea    Von Willebrand's disease (HCC)    bruises easily    Past Surgical History:  Procedure Laterality Date   ARTHROTOMY Left 09/18/2022   Procedure: Fourth and Fifth Tarsal Metatarsal Phalangeal Arthrotomy and Debridement; Protein Rich Plasma injection to 4-5 Tarsalmetarsal joint;  Surgeon: Kit Rush, MD;  Location: [facility];  Service: Orthopedics;  Laterality: Left;   BONE EXOSTOSIS EXCISION Left 02/24/2019   Procedure: Dorsal Exostectomy;  Surgeon: Kit Rush, MD;   Location: [facility];  Service: Orthopedics;  Laterality: Left;   CESAREAN SECTION     FOOT ARTHRODESIS Left 09/18/2022   Procedure: Arthrodesis Tarsal Metatarsal Joint 1-3;  Surgeon: Kit Rush, MD;  Location: [facility];  Service: Orthopedics;  Laterality: Left;   GASTROCNEMIUS RECESSION Left 09/18/2022   Procedure: Gastrocnemius Recession;  Surgeon: Kit Rush, MD;  Location: [facility];  Service: Orthopedics;  Laterality: Left;   laparoscopy removal of right ovarian cyst     left knee arthroscopy     NOVASURE ABLATION  12/2005   pylonidal cyst removal     SEPTOPLASTY     TOOTH EXTRACTION  12/2020    Prior to Admission medications   Medication Sig Start Date End Date Taking? Authorizing Provider  buPROPion  (WELLBUTRIN  XL) 150 MG 24 hr tablet Take 1 tablet (150 mg total) by mouth every morning. 07/20/24     cholecalciferol  (VITAMIN D3) 25 MCG (1000 UNIT) tablet Take 1,000 Units by mouth daily.    [provider]  Coenzyme Q10 (CO Q-10) 50 MG CAPS Take by mouth.    [provider]  conjugated estrogens  (PREMARIN ) vaginal cream Insert 0.5 g vaginally nightly for 2 weeks then use 2-3 times a week 07/20/23     dicyclomine  (BENTYL ) 10 MG capsule Take 1 capsule (10 mg total) by mouth 3 (three) times daily as needed for spasms. 08/24/23   Federico,  Ying C, MD  Estradiol  (YUVAFEM ) 10 MCG TABS vaginal tablet Insert 1 tablet 3 times a week by vaginal route. 03/02/24     fluconazole  (DIFLUCAN ) 150 MG tablet Take 1 tablet (150 mg total) by mouth as needed for candidiasis flareup. 04/18/24     gabapentin  (NEURONTIN ) 300 MG capsule Take 1 capsule (300 mg total) by mouth 3 (three) times daily. 10/06/23   Millikan, Megan, NP  Multiple Vitamins-Minerals (MULTIPLE VITAMINS/WOMENS) tablet Take 1 tablet by mouth daily. Patient not taking: Reported on 07/27/2024    [provider]  naratriptan  (AMERGE) 2.5 MG tablet Take one tablet by mouth at  onset of headache; if returns or does not resolve, may repeat after 2-4 hours; do not exceed two tabs in 24 hours. 07/20/24   Millikan, Megan, NP  nitrofurantoin , macrocrystal-monohydrate, (MACROBID ) 100 MG capsule Take 1 capsule (100 mg total) by mouth 2 (two) times daily. 01/18/24   Vivienne Delon HERO, PA-C  nitrofurantoin , macrocrystal-monohydrate, (MACROBID ) 100 MG capsule Take 1 capsule (100 mg total) by mouth daily as needed. 03/02/24     nystatin  cream (MYCOSTATIN ) Apply 1 Application topically 2 (two) times daily. 01/30/23     ondansetron  (ZOFRAN ) 4 MG tablet Take 1 tablet (4 mg total) by mouth every 6 (six) hours as needed for nausea and vomiting. 05/16/24     phenazopyridine  (PYRIDIUM ) 100 MG tablet Take 1 tablet (100 mg total) by mouth 3 (three) times daily as needed for pain. 01/18/24   Vivienne Delon HERO, PA-C  Probiotic Product (UP4 PROBIOTICS PO) Take by mouth.    [provider]  traZODone  (DESYREL ) 50 MG tablet Take 1-2 tablets (50-100 mg total) by mouth at bedtime. 01/12/24   Millikan, Megan, NP  traZODone  (DESYREL ) 50 MG tablet Take 2 tablets (100 mg total) by mouth at bedtime. 07/20/24     zolpidem  (AMBIEN ) 5 MG tablet Take 1 tablet (5 mg total) by mouth at bedtime as needed for sleep. 04/28/24   Millikan, Megan, NP    Current Outpatient Medications  Medication Sig Dispense Refill   buPROPion  (WELLBUTRIN  XL) 150 MG 24 hr tablet Take 1 tablet (150 mg total) by mouth every morning. 90 tablet 0   cholecalciferol  (VITAMIN D3) 25 MCG (1000 UNIT) tablet Take 1,000 Units by mouth daily.     Coenzyme Q10 (CO Q-10) 50 MG CAPS Take by mouth.     conjugated estrogens  (PREMARIN ) vaginal cream Insert 0.5 g vaginally nightly for 2 weeks then use 2-3 times a week 30 g 1   dicyclomine  (BENTYL ) 10 MG capsule Take 1 capsule (10 mg total) by mouth 3 (three) times daily as needed for spasms. 90 capsule 0   Estradiol  (YUVAFEM ) 10 MCG TABS vaginal tablet Insert 1 tablet 3 times a week by vaginal  route. 18 tablet 3   fluconazole  (DIFLUCAN ) 150 MG tablet Take 1 tablet (150 mg total) by mouth as needed for candidiasis flareup. 15 tablet 1   gabapentin  (NEURONTIN ) 300 MG capsule Take 1 capsule (300 mg total) by mouth 3 (three) times daily. 90 capsule 11   Multiple Vitamins-Minerals (MULTIPLE VITAMINS/WOMENS) tablet Take 1 tablet by mouth daily. (Patient not taking: Reported on 07/27/2024)     naratriptan  (AMERGE) 2.5 MG tablet Take one tablet by mouth at onset of headache; if returns or does not resolve, may repeat after 2-4 hours; do not exceed two tabs in 24 hours. 9 tablet 3   nitrofurantoin , macrocrystal-monohydrate, (MACROBID ) 100 MG capsule Take 1 capsule (100 mg total) by mouth  2 (two) times daily. 10 capsule 0   nitrofurantoin , macrocrystal-monohydrate, (MACROBID ) 100 MG capsule Take 1 capsule (100 mg total) by mouth daily as needed. 30 capsule 2   nystatin  cream (MYCOSTATIN ) Apply 1 Application topically 2 (two) times daily. 60 g 1   ondansetron  (ZOFRAN ) 4 MG tablet Take 1 tablet (4 mg total) by mouth every 6 (six) hours as needed for nausea and vomiting. 4 tablet 0   phenazopyridine  (PYRIDIUM ) 100 MG tablet Take 1 tablet (100 mg total) by mouth 3 (three) times daily as needed for pain. 10 tablet 0   Probiotic Product (UP4 PROBIOTICS PO) Take by mouth.     traZODone  (DESYREL ) 50 MG tablet Take 1-2 tablets (50-100 mg total) by mouth at bedtime. 60 tablet 5   traZODone  (DESYREL ) 50 MG tablet Take 2 tablets (100 mg total) by mouth at bedtime. 180 tablet 0   zolpidem  (AMBIEN ) 5 MG tablet Take 1 tablet (5 mg total) by mouth at bedtime as needed for sleep. 30 tablet 0   No current facility-administered medications for this visit.    Allergies as of 08/24/2024 - Review Complete 01/18/2024  Allergen Reaction Noted   Sulfa antibiotics Other (See Comments) 10/13/1976   Ciprofloxacin   09/11/2022   Levaquin [levofloxacin in d5w] Other (See Comments) 07/01/2018    Family History  Problem  Relation Age of Onset   Hypertension Mother    Arthritis Mother    Hypertension Father    Heart disease Father    Prostate cancer Father    Colon polyps Father    Lupus Sister    Ulcerative colitis Sister    Other Sister        mythensia gravis   Stomach cancer Maternal Grandmother    Colon cancer Neg Hx    Rectal cancer Neg Hx    Esophageal cancer Neg Hx    Liver cancer Neg Hx    Migraines Neg Hx     Social History   Socioeconomic History   Marital status: Married    Spouse name: Not on file   Number of children: 2   Years of education: Not on file   Highest education level: Not on file  Occupational History   Occupation: CRNA  Tobacco Use   Smoking status: Former    Types: Cigarettes   Smokeless tobacco: Never  Vaping Use   Vaping status: Never Used  Substance and Sexual Activity   Alcohol use: Yes    Alcohol/week: 10.0 - 15.0 standard drinks of alcohol    Types: 10 - 15 Glasses of wine per week    Comment: wine or vodka; was previously 1-2 per night, but liver enzymes up so she has cut back   Drug use: No   Sexual activity: Yes    Partners: Male    Comment: husband vasectomy-1st intercourse 21 yo-5 partners  Other Topics Concern   Not on file  Social History Narrative   Lives at home with husband and 2 children who are freshman in college   Right handed   Caffeine : maybe 1 cup/day   Social Drivers of Corporate Investment Banker Strain: Not on file  Food Insecurity: Not on file  Transportation Needs: Not on file  Physical Activity: Not on file  Stress: Not on file  Social Connections: Not on file  Intimate Partner Violence: Not on file    Review of Systems:  All other review of systems negative except as mentioned in the HPI.  Physical Exam: Vital  signs LMP 11/14/2012   General:   Alert,  Well-developed, well-nourished, pleasant and cooperative in NAD Airway:  Mallampati  Lungs:  Clear throughout to auscultation.   Heart:  Regular rate and  rhythm; no murmurs, clicks, rubs,  or gallops. Abdomen:  Soft, nontender and nondistended. Normal bowel sounds.   Neuro/Psych:  Normal mood and affect. A and O x 3  Inocente Hausen, MD [facility]

## 2024-08-23 DIAGNOSIS — H524 Presbyopia: Secondary | ICD-10-CM | POA: Diagnosis not present

## 2024-08-23 DIAGNOSIS — H01021 Squamous blepharitis right upper eyelid: Secondary | ICD-10-CM | POA: Diagnosis not present

## 2024-08-24 ENCOUNTER — Encounter: Payer: Self-pay | Admitting: Pediatrics

## 2024-08-24 ENCOUNTER — Ambulatory Visit (AMBULATORY_SURGERY_CENTER): Admitting: Pediatrics

## 2024-08-24 VITALS — BP 98/62 | HR 75 | Resp 19

## 2024-08-24 DIAGNOSIS — K648 Other hemorrhoids: Secondary | ICD-10-CM

## 2024-08-24 DIAGNOSIS — R1031 Right lower quadrant pain: Secondary | ICD-10-CM

## 2024-08-24 DIAGNOSIS — G4730 Sleep apnea, unspecified: Secondary | ICD-10-CM | POA: Diagnosis not present

## 2024-08-24 DIAGNOSIS — K635 Polyp of colon: Secondary | ICD-10-CM | POA: Diagnosis not present

## 2024-08-24 DIAGNOSIS — K5190 Ulcerative colitis, unspecified, without complications: Secondary | ICD-10-CM

## 2024-08-24 DIAGNOSIS — F32A Depression, unspecified: Secondary | ICD-10-CM | POA: Diagnosis not present

## 2024-08-24 DIAGNOSIS — E785 Hyperlipidemia, unspecified: Secondary | ICD-10-CM | POA: Diagnosis not present

## 2024-08-24 DIAGNOSIS — D125 Benign neoplasm of sigmoid colon: Secondary | ICD-10-CM | POA: Diagnosis not present

## 2024-08-24 DIAGNOSIS — K529 Noninfective gastroenteritis and colitis, unspecified: Secondary | ICD-10-CM | POA: Diagnosis not present

## 2024-08-24 DIAGNOSIS — F419 Anxiety disorder, unspecified: Secondary | ICD-10-CM | POA: Diagnosis not present

## 2024-08-24 MED ORDER — SODIUM CHLORIDE 0.9 % IV SOLN: 500.0000 mL | INTRAVENOUS | Status: DC

## 2024-08-24 NOTE — Progress Notes (Signed)
 Pt's states no medical or surgical changes since previsit or office visit.

## 2024-08-24 NOTE — Op Note (Signed)
 [facility] Patient Name: [name] Procedure Date: 08/24/2024 9:54 AM MRN: [id number] Endoscopist: Inocente Hausen , MD, [id number] Age: 63 Referring MD:  Date of Birth: 01-Aug-1961 Gender: Female Account #: [id number] Procedure:                Colonoscopy Indications:              Last colonoscopy: September 2024, Abdominal pain in                            the right lower quadrant, follow-up of ileum                            erosions seen on colonoscopy 06/2023 with biopsies                            showing mild chronic ileitis -rule out Crohn's                            disease, NSAID enteropathy Medicines:                Monitored Anesthesia Care Procedure:                Pre-Anesthesia Assessment:                           - Prior to the procedure, a History and Physical                            was performed, and patient medications and                            allergies were reviewed. The patient's tolerance of                            previous anesthesia was also reviewed. The risks                            and benefits of the procedure and the sedation                            options and risks were discussed with the patient.                            All questions were answered, and informed consent                            was obtained. Prior Anticoagulants: The patient has                            taken no anticoagulant or antiplatelet agents. ASA                            Grade Assessment: III - A patient with severe  systemic disease. After reviewing the risks and                            benefits, the patient was deemed in satisfactory                            condition to undergo the procedure.                           After obtaining informed consent, the colonoscope                            was passed under direct vision. Throughout the                            procedure, the patient's blood  pressure, pulse, and                            oxygen saturations were monitored continuously. The                            Olympus CF-HQ190L ([id number]) Colonoscope was                            introduced through the anus and advanced to the 10                            cm into the ileum. The colonoscopy was performed                            without difficulty. The patient tolerated the                            procedure well. The quality of the bowel                            preparation was good. The terminal ileum, ileocecal                            valve, appendiceal orifice, and rectum were                            photographed. Scope In: 10:14:36 AM Scope Out: 10:34:26 AM Scope Withdrawal Time: 0 hours 14 minutes 56 seconds  Total Procedure Duration: 0 hours 19 minutes 50 seconds  Findings:                 The perianal and digital rectal examinations were                            normal. Pertinent negatives include normal                            sphincter tone and no palpable rectal lesions.  Normal mucosa was found in the entire colon.                            Biopsies were taken with a cold forceps for                            histology.                           The terminal ileum appeared normal. The terminal                            ileum was intubated 10 cm and no erosions were                            seen. Biopsies were taken with a cold forceps for                            histology.                           A 4 mm polyp was found in the sigmoid colon. The                            polyp was sessile. The polyp was removed with a                            cold biopsy forceps. Resection and retrieval were                            complete.                           Internal hemorrhoids were found during retroflexion. Complications:            No immediate complications. Estimated blood loss:                             Minimal. Estimated Blood Loss:     Estimated blood loss was minimal. Impression:               - Normal mucosa in the entire examined colon.                            Biopsied.                           - The examined portion of the ileum was normal.                            Biopsied.                           - One 4 mm polyp in the sigmoid colon, removed with  a cold biopsy forceps. Resected and retrieved.                           - Internal hemorrhoids. Recommendation:           - Discharge patient to home (ambulatory).                           - Await pathology results.                           - The findings and recommendations were discussed                            with the patient's family.                           - Patient has a contact number available for                            emergencies. The signs and symptoms of potential                            delayed complications were discussed with the                            patient. Return to normal activities tomorrow.                            Written discharge instructions were provided to the                            patient. Inocente Hausen, MD 08/24/2024 10:41:12 AM This report has been signed electronically.

## 2024-08-24 NOTE — Patient Instructions (Signed)

## 2024-08-24 NOTE — Progress Notes (Signed)
 Sedate, gd SR, tolerated procedure well, VSS, report to RN

## 2024-08-24 NOTE — Progress Notes (Signed)
 Called to room to assist during endoscopic procedure.  Patient ID and intended procedure confirmed with present staff. Received instructions for my participation in the procedure from the performing physician.

## 2024-08-25 ENCOUNTER — Telehealth: Payer: Self-pay

## 2024-08-25 NOTE — Telephone Encounter (Signed)
  Follow up Call-     08/24/2024    9:33 AM 06/22/2023   12:42 PM  Call back number  Post procedure Call Back phone  # [phone]  Permission to leave phone message Yes Yes     Patient questions:  Do you have a fever, pain , or abdominal swelling? No. Pain Score  0 *  Have you tolerated food without any problems? Yes.    Have you been able to return to your normal activities? Yes.    Do you have any questions about your discharge instructions: Diet   No. Medications  No. Follow up visit  No.  Do you have questions or concerns about your Care? No.  Actions: * If pain score is 4 or above: No action needed, pain <4.

## 2024-08-29 LAB — SURGICAL PATHOLOGY

## 2024-08-31 ENCOUNTER — Ambulatory Visit: Payer: Self-pay | Admitting: Pediatrics

## 2024-08-31 DIAGNOSIS — T162XXA Foreign body in left ear, initial encounter: Secondary | ICD-10-CM | POA: Diagnosis not present

## 2024-08-31 DIAGNOSIS — T161XXA Foreign body in right ear, initial encounter: Secondary | ICD-10-CM | POA: Diagnosis not present

## 2024-08-31 NOTE — Progress Notes (Signed)
 Otolaryngology Clinic Note  HPI:    Cc: sand in ears.  [name] is a 63 y.o. female who presents as a new patient for ear cleaning. She was in [place]  one month ago and believes she got sand in the ears. Has tried peroxide and washing them out but still feels like something is in her ears. No ear pain, ear drainage, hearing loss, tinnitus or dizziness.   No history of recurrent ear infections, ear surgeries or significant noise exposures.  PMH of OSA (currently using CPAP).   Non-smoker.  PMH/Meds/All/SocHx/FamHx/ROS:   Medical History[1]  Surgical History[2]  No family history of bleeding disorders, wound healing problems or difficulty with anesthesia.      Current Medications[3]  A complete ROS was performed with pertinent positives/negatives noted in the HPI. The remainder of the ROS are negative.    Physical Exam:    There were no vitals taken for this visit.   General Awake, at baseline alertness during examination.  Eyes No scleral icterus or conjunctival hemorrhage. Globe position appears normal. EOMI.   Right Ear EAC largely patent with some sand pebbles along the floor of ear canal and anterior sulcus, TM intact w/o inflammation. Middle ear well aerated.   Left Ear EAC largely patent with some sand pebbles along the floor of the ear canal, TM intact w/o inflammation. Middle ear well aerated.   Nose Patent, no polyps or masses seen on anterior rhinoscopy.  Oral cavity No mucosal lesions or tumors seen. Tongue midline.   Oropharynx Symmetric tonsils.   Neck No abnormal cervical lymphadenopathy. No thyromegaly. No thyroid  masses palpated.  Cardio-vascular No cyanosis.  Pulmonary No audible stridor. Breathing easily with no labor.  Neuro Symmetric facial movement.   Psychiatry Appropriate affect and mood for clinic visit.   Independent Review of Additional Tests or Records:  Medical records.   Procedures:   Procedure Note - Ear Microscopy,  Bilateral:  Risks/benefits and alternatives were discussed with patient who understands and agrees to proceed.  DETAILS OF PROCEDURE: The patient was positioned and the ear was examined with the microscope. Small sand pebbles removed using 3-0 suction with the exception of a few in the right anterior sulcus that were difficult to reach. Tms are normal and intact, middle ears aerated. No signs of infection or cholesteatoma.  The patient tolerated this well.  No complications.  Impression & Plans:  [name] is a 63 y.o. female with a minor amount of sand in both ear canals after recent trip to [place] . I was able to remove the majority of them under the microscope. I recommend instilling one drop of mineral or olive oil into the ears periodically to facilitate the remainder to work themselves out of the ear. Monitor for new or worsening symptoms.  Follow up as needed with ENT.   Patient agrees with the plan.  Velia Pry, PA-C [facility]       [1] No past medical history on file. [2] No past surgical history on file. [3] No current outpatient medications on file.

## 2024-09-16 ENCOUNTER — Other Ambulatory Visit (HOSPITAL_COMMUNITY): Payer: Self-pay

## 2024-09-16 ENCOUNTER — Other Ambulatory Visit: Payer: Self-pay

## 2024-09-16 MED ORDER — BUPROPION HCL ER (XL) 150 MG PO TB24
150.0000 mg | ORAL_TABLET | Freq: Every morning | ORAL | 0 refills | Status: AC
  Filled 2024-09-16: qty 90, 90d supply, fill #0
  Filled ????-??-??: fill #0

## 2024-09-20 ENCOUNTER — Other Ambulatory Visit: Payer: Self-pay

## 2024-09-28 ENCOUNTER — Other Ambulatory Visit (HOSPITAL_COMMUNITY): Payer: Self-pay

## 2024-09-28 ENCOUNTER — Telehealth: Payer: 59 | Admitting: Adult Health

## 2024-09-28 DIAGNOSIS — G43109 Migraine with aura, not intractable, without status migrainosus: Secondary | ICD-10-CM

## 2024-09-28 DIAGNOSIS — G43101 Migraine with aura, not intractable, with status migrainosus: Secondary | ICD-10-CM

## 2024-09-28 DIAGNOSIS — N951 Menopausal and female climacteric states: Secondary | ICD-10-CM

## 2024-09-28 MED ORDER — GABAPENTIN 300 MG PO CAPS
300.0000 mg | ORAL_CAPSULE | Freq: Three times a day (TID) | ORAL | 11 refills | Status: AC
  Filled 2024-09-28: qty 90, 30d supply, fill #0

## 2024-09-28 MED FILL — Naratriptan HCl Tab 2.5 MG (Base Equiv): 2.5000 mg | ORAL | 27 days supply | Qty: 9 | Fill #0 | Status: CN

## 2024-09-28 NOTE — Progress Notes (Signed)
 PATIENT: [name] DOB: [birth year]  REASON FOR VISIT: follow up HISTORY FROM: patient  No chief complaint on file.    HISTORY OF PRESENT ILLNESS: Today 09/28/2024:  [name] is a 63 y.o. female with a history of migraine headaches and obstructive sleep apnea. Returns today for follow-up.   Migraines: Patient states that her migraines are manageable.  She never started Qulipta  as her headaches decreased.  She continues to use gabapentin  300 mg twice a day but lately she has been taking it TID due to hot flashes. She inially started on gabapentin  for hot flashes and it helps. Continues to take naratriptan  for abortive therapy and this works well for her.  OSA: has not restarted. Unable to tolerate it.      12/18/22: [name] is a 63 y.o. female with a history of Migraine headaches and OSA. Returns today for follow-up.   Struggle with using the CPAP. Doesn't sleep well with it. Wakes her up at night.  Continues to use trazodone .  She is going to try restarting the CPAP.  Migraines: has atleast 3-4 migraines a month. Phonophobia. Migraine usually starts with visual changes. She had 2 episodes where she temporaily got lost. Naratriptan  works well- resolves in 30 minutes. No longer doing Emgality  due to site reaction. Continues to take gabapentin  300 mg twice a day.   Has been out of work due to foot surgery. Will start back at work next week.   04/04/2021: Patient is here for follow-up of transient global amnesia and new onset of headaches.  Patient was started on gabapentin  which she feels was extremely helpful for her headaches however it is making her gain weight, and also help with her hot flashes.  She is also on [name] . She thinks the gabapentin  has helped her headaches. She desn't think it was the [name] . She was taking it 300mg  bid and 600mg  qhs. Now she is taking it more at night and back to taking 300mg  at bedtime.  She was doing extremely well with her  headaches however over the last week there has been some stress and she has recurrent headaches.  I encouraged her to continue the Emgality  and we can follow-up again in 3 months.  She can continue the gabapentin  at a lower dose.   Patient had a very strange episode where she ordered from [employer] at 315 and 415 in the morning and doesn't remember doing it. She does take ambien  but this has never happened to her.  We discussed could be a medication effect, I find it highly unlikely that she could sign onto [employer] and in order items if she were having a seizure even if it were complex partial, but cannot rule it out.   - we will continue the ajovy , take benadryl  prior for any reactions.  If insurance prefers Emgality  we can order that instead.   - she is taking 300mg  qam and 300qpm helping with the hot flashes but she is gaining weight so she is going to see obgyn to help with hot flashes. She thinks gabapentin  helped her headaches unfortunately.    - she wake often in the middle of the night, she is extremely fatigued during the day, we could send to dr dohmeier and during the sleep study monitor eeg       01/07/2021: She has had a headache since she left. It is not getting better. She is having the migraines daily, the brain feels bruised, in the left  eye, it may seem like it goes away but it comes back with any stress, light makes it worse.  She had the abscess found in the left side of her jaw, it is now improved but she continues to have headaches.  She did have a migraine cocktail in our office and that helped significantly but again can still continues to have headaches.  She tried the Axert , she tried the Maxalt , she tried and Nurtec, and still having headaches.  We discussed at this time it would probably be best for her to start a preventative, I gave her [name]  samples, we injected the first 1 in the office, we will check back in with her in 10 weeks and see how she is feeling.   12/12/20: Ms.  Armstrong is a 63 year old female with a history of altered awareness, migraine headache. She returns today still with a daily headache.  In the past Dr. Darleen has tried a prednisone  Dosepak, indomethacin , axert , nurtec and migraine infusion but this has not broken her headache cycle.  She does state that with the migraine infusion she did go a day and a half without headache.  In the last 2 weeks she has been diagnosed with an abscessed tooth.  She was originally placed on penicillin and had a tooth removed today.  She describes her headache today as a dull headache 2 out of 10 on the pain scale.  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  HISTORY (copied from Dr. Sharion note)  [name] is a 63 y.o. female here as requested by  Ivonne Mustache MD for migraines. PMHx von Willebrand's disease, ray nodes, peripheral neuropathy, migraine, insomnia, IBS, hyperlipidemia, depression, back pain, arthritis, anxiety.  I reviewed Ivonne Mustache MDs discharge summary from November 15, 2020: Patient presented to the emergency room secondary to transient left-sided weakness, dizziness and visual defects, by the time physician saw her her symptoms were completely resolved and she felt back to normal, symptoms were present for several hours, she was last seen well when waking up in the morning, she expressed having episodes after showering when her leg gave way, while driving to work she experienced diplopia and triple vision, Rx to experienced a period of time according to her 20 minutes or so of transient amnesia, CT scan of the head was without acute abnormality, negative MRI, normal blood work, TIA/stroke work-up was negative, her symptoms were thought to be secondary to status migrainosus, she was given multiple doses of migraine cocktail during hospitalization, she was discharged on Maxalt  and valproic acid.   She feels spacey, she still has a headache, has  had it since she was seen at the ER, she was driving and she says she had triple vision and closed one eye and it resolved, she has had a headache for 2 months and she has had a headache since her friend passed away thanksgiving, she cared for her friend's mother who dies in hospice (she gave her morphine throught the night) she didn't sleep for several days helping out and a few days later had the incident driving and triple vision, she had a headache at the time (had been ongoing for months in the setting of stress), she wakes up with migraine, better standing, waxes and wanes, prior to a few months ago she would get a headache once a month and caffeine  and analgesic may help, but she would start with a visual problem, looks like  a 70s wavy lines in both eyes, may not progress to a headache if she catches it on time. But this has been different. She lost 3 hours, she was at work, she was told she was appropriate ut doesn't remember 3 hours. Not doing well on Depakote . She is taking the maxalt  4-5 times worried the headache will come back. Migraine cocktail inpatient helped, she slept the first night int he hospital, she had severe light sensitivity, she had benadryl , compazine , decadron  she felt better prior to going home but the headache has returned but not as severe since she came home. maxalt  is helping.    Reviewed notes, labs and imaging from outside physicians, which showed:   From a thorough review of records, medications tried that can be used in migraine management includes Tylenol , aspirin , Fioricet , Flexeril , Decadron  injections, Benadryl  injections, Depakote , ibuprofen, ketorolac  injections, magnesium, Depo-Medrol  injection, Reglan  injections, metoprolol , Zofran  tablets and injections, Compazine  injections, rizatriptan , Effexor .     B12 373, TSH 2.6, hemoglobin A1c 5.2, LDL 134, CBC normal, BMP unremarkable, HIV negative.     MRI brain 11/13/2020: FINDINGS: Brain: Ventricle size and cerebral  volume within normal limits. Several small hyperintensities in the left frontal lobe. Brainstem and cerebellum normal.   Negative for acute infarct.  Negative for hemorrhage or mass.   Vascular: Normal arterial flow voids.   Skull and upper cervical spine: Negative   Sinuses/Orbits: Paranasal sinuses clear negative orbit   Other: None   IMPRESSION: No acute abnormality. Mild white matter changes most likely due to chronic ischemia.   CTA H&N: IMPRESSION: 11/13/2020 No large vessel occlusion or hemodynamically significant proximal stenosis in the head or neck.   IMPRESSION: This study is within normal limits. No seizures or epileptiform discharges were seen throughout the recording.    REVIEW OF SYSTEMS: Out of a complete 14 system review of symptoms, the patient complains only of the following symptoms, and all other reviewed systems are negative. See HPI  ALLERGIES: Allergies  Allergen Reactions   Ciprofloxacin  Other (See Comments)    Tendonitis    Levaquin [Levofloxacin In D5w] Other (See Comments)    Tendonitis in shoulder, severe yeast infections   Sulfa Antibiotics Other (See Comments)    Unknown reaction - childhood allergy    HOME MEDICATIONS: Outpatient Medications Prior to Visit  Medication Sig Dispense Refill   buPROPion  (WELLBUTRIN  XL) 150 MG 24 hr tablet Take 1 tablet (150 mg total) by mouth every morning. 90 tablet 0   cholecalciferol  (VITAMIN D3) 25 MCG (1000 UNIT) tablet Take 1,000 Units by mouth daily.     Coenzyme Q10 (CO Q-10) 50 MG CAPS Take by mouth.     conjugated estrogens  (PREMARIN ) vaginal cream Insert 0.5 g vaginally nightly for 2 weeks then use 2-3 times a week 30 g 1   dicyclomine  (BENTYL ) 10 MG capsule Take 1 capsule (10 mg total) by mouth 3 (three) times daily as needed for spasms. 90 capsule 0   Estradiol  (YUVAFEM ) 10 MCG TABS vaginal tablet Insert 1 tablet 3 times a week by vaginal route. 18 tablet 3   fluconazole  (DIFLUCAN ) 150 MG tablet  Take 1 tablet (150 mg total) by mouth as needed for candidiasis flareup. 15 tablet 1   gabapentin  (NEURONTIN ) 300 MG capsule Take 1 capsule (300 mg total) by mouth 3 (three) times daily. 90 capsule 11   Multiple Vitamins-Minerals (MULTIPLE VITAMINS/WOMENS) tablet Take 1 tablet by mouth daily. (Patient not taking: Reported on 07/27/2024)     naratriptan  (  AMERGE) 2.5 MG tablet Take one tablet by mouth at onset of headache; if returns or does not resolve, may repeat after 2-4 hours; do not exceed two tabs in 24 hours. 9 tablet 3   nitrofurantoin , macrocrystal-monohydrate, (MACROBID ) 100 MG capsule Take 1 capsule (100 mg total) by mouth 2 (two) times daily. 10 capsule 0   nitrofurantoin , macrocrystal-monohydrate, (MACROBID ) 100 MG capsule Take 1 capsule (100 mg total) by mouth daily as needed. 30 capsule 2   nystatin  cream (MYCOSTATIN ) Apply 1 Application topically 2 (two) times daily. 60 g 1   ondansetron  (ZOFRAN ) 4 MG tablet Take 1 tablet (4 mg total) by mouth every 6 (six) hours as needed for nausea and vomiting. 4 tablet 0   phenazopyridine  (PYRIDIUM ) 100 MG tablet Take 1 tablet (100 mg total) by mouth 3 (three) times daily as needed for pain. 10 tablet 0   Probiotic Product (UP4 PROBIOTICS PO) Take by mouth.     traZODone  (DESYREL ) 50 MG tablet Take 1-2 tablets (50-100 mg total) by mouth at bedtime. 60 tablet 5   traZODone  (DESYREL ) 50 MG tablet Take 2 tablets (100 mg total) by mouth at bedtime. 180 tablet 0   zolpidem  (AMBIEN ) 5 MG tablet Take 1 tablet (5 mg total) by mouth at bedtime as needed for sleep. 30 tablet 0   No facility-administered medications prior to visit.    PAST MEDICAL HISTORY: Past Medical History:  Diagnosis Date   Allergy    Anxiety    Arthritis    Back pain    Chronic rhinitis    Clotting disorder    Complication of anesthesia    Depression    Dysrhythmia    hx svt- pcp follows   Family history of colonic polyps    Hepatic hemangioma    Hyperlipidemia    IBS  (irritable bowel syndrome)    Insomnia    Migraine    Osteoarthritis, foot, localized    left   Perimenopause    PONV (postoperative nausea and vomiting)    Raynauds syndrome    Seasonal allergies    Sleep apnea    Von Willebrand's disease (HCC)    bruises easily    PAST SURGICAL HISTORY: Past Surgical History:  Procedure Laterality Date   ARTHROTOMY Left 09/18/2022   Procedure: Fourth and Fifth Tarsal Metatarsal Phalangeal Arthrotomy and Debridement; Protein Rich Plasma injection to 4-5 Tarsalmetarsal joint;  Surgeon: Kit Rush, MD;  Location: [facility];  Service: Orthopedics;  Laterality: Left;   BONE EXOSTOSIS EXCISION Left 02/24/2019   Procedure: Dorsal Exostectomy;  Surgeon: Kit Rush, MD;  Location: [facility];  Service: Orthopedics;  Laterality: Left;   CESAREAN SECTION     COLONOSCOPY     FOOT ARTHRODESIS Left 09/18/2022   Procedure: Arthrodesis Tarsal Metatarsal Joint 1-3;  Surgeon: Kit Rush, MD;  Location: [facility];  Service: Orthopedics;  Laterality: Left;   GASTROCNEMIUS RECESSION Left 09/18/2022   Procedure: Gastrocnemius Recession;  Surgeon: Kit Rush, MD;  Location: [facility];  Service: Orthopedics;  Laterality: Left;   laparoscopy removal of right ovarian cyst     left knee arthroscopy     NOVASURE ABLATION  12/2005   pylonidal cyst removal     SEPTOPLASTY     TOOTH EXTRACTION  12/2020    FAMILY HISTORY: Family History  Problem Relation Age of Onset   Hypertension Mother    Arthritis Mother    Hypertension Father    Heart disease  Father    Prostate cancer Father    Colon polyps Father    Lupus Sister    Ulcerative colitis Sister    Other Sister        mythensia gravis   Stomach cancer Maternal Grandmother    Colon cancer Neg Hx    Rectal cancer Neg Hx    Esophageal cancer Neg Hx    Liver cancer Neg Hx    Migraines Neg Hx     SOCIAL HISTORY: Social History    Socioeconomic History   Marital status: Married    Spouse name: Not on file   Number of children: 2   Years of education: Not on file   Highest education level: Not on file  Occupational History   Occupation: CRNA  Tobacco Use   Smoking status: Former    Types: Cigarettes   Smokeless tobacco: Never  Vaping Use   Vaping status: Never Used  Substance and Sexual Activity   Alcohol use: Yes    Alcohol/week: 10.0 - 15.0 standard drinks of alcohol    Types: 10 - 15 Glasses of wine per week    Comment: wine or vodka; was previously 1-2 per night, but liver enzymes up so she has cut back   Drug use: No   Sexual activity: Yes    Partners: Male    Comment: husband vasectomy-1st intercourse 21 yo-5 partners  Other Topics Concern   Not on file  Social History Narrative   Lives at home with husband and 2 children who are freshman in college   Right handed   Caffeine : maybe 1 cup/day   Social Drivers of Health   Tobacco Use: Low Risk (08/31/2024)   Received from [facility]   Patient History    Smoking Tobacco Use: Never    Smokeless Tobacco Use: Never    Passive Exposure: Never  Recent Concern: Tobacco Use - Medium Risk (08/24/2024)   Patient History    Smoking Tobacco Use: Former    Smokeless Tobacco Use: Never    Passive Exposure: Not on Actuary Strain: Not on file  Food Insecurity: Low Risk (08/31/2024)   Received from [facility]   Epic    Within the past 12 months, you worried that your food would run out before you got money to buy more: Never true    Within the past 12 months, the food you bought just didn't last and you didn't have money to get more. : Never true  Transportation Needs: No Transportation Needs (08/31/2024)   Received from [facility]    In the past 12 months, has lack of reliable transportation kept you from medical appointments, meetings, work or from getting things needed for daily living? : No  Physical  Activity: Not on file  Stress: Not on file  Social Connections: Not on file  Intimate Partner Violence: Not on file  Depression (PHQ2-9): Low Risk (08/05/2023)   Depression (PHQ2-9)    PHQ-2 Score: 0  Alcohol Screen: Not on file  Housing: Low Risk (08/31/2024)   Received from [facility]   Epic    What is your living situation today?: I have a steady place to live    Think about the place you live. Do you have problems with any of the following? Choose all that apply:: None/None on this list  Utilities: Low Risk (08/31/2024)   Received from [facility]   Utilities    In the past 12 months has  the electric, gas, oil, or water company threatened to shut off services in your home? : No  Health Literacy: Not on file      PHYSICAL EXAM  There were no vitals filed for this visit.   There is no height or weight on file to calculate BMI.  Generalized: Well developed, in no acute distress   Neurological examination  Mentation: Alert oriented to time, place, history taking. Follows all commands speech and language fluent Cranial nerve II-XII: Pupils were equal round reactive to light. Extraocular movements were full, visual field were full on confrontational test. Facial sensation and strength were normal. Head turning and shoulder shrug  were normal and symmetric. Motor: The motor testing reveals 5 over 5 strength of all 4 extremities. Good symmetric motor tone is noted throughout.  Sensory: Sensory testing is intact to soft touch on all 4 extremities. No evidence of extinction is noted.  Coordination: Cerebellar testing reveals good finger-nose-finger and heel-to-shin bilaterally.  Gait and station: Gait is normal.    DIAGNOSTIC DATA (LABS, IMAGING, TESTING) - I reviewed patient records, labs, notes, testing and imaging myself where available.  Lab Results  Component Value Date   WBC 5.9 11/13/2020   HGB 13.1 11/13/2020   HCT 40.3 11/13/2020   MCV 96.0 11/13/2020   PLT  182 11/13/2020      Component Value Date/Time   NA 137 11/13/2020 0811   NA 138 12/09/2019 1052   K 4.1 11/13/2020 0811   CL 102 11/13/2020 0811   CO2 27 11/13/2020 0811   GLUCOSE 105 (H) 11/13/2020 0811   BUN 15 11/13/2020 0811   BUN 15 12/09/2019 1052   CREATININE 0.68 11/13/2020 0811   CREATININE 0.82 08/22/2014 1130   CALCIUM 8.9 11/13/2020 0811   PROT 6.4 08/22/2014 1130   ALBUMIN 4.1 08/22/2014 1130   AST 20 08/22/2014 1130   ALT 23 08/22/2014 1130   ALKPHOS 57 08/22/2014 1130   BILITOT 0.3 08/22/2014 1130   GFRNONAA >60 11/13/2020 0811   GFRAA 79 12/09/2019 1052   Lab Results  Component Value Date   CHOL 215 (H) 11/14/2020   HDL 58 11/14/2020   LDLCALC 134 (H) 11/14/2020   TRIG 116 11/14/2020   CHOLHDL 3.7 11/14/2020   Lab Results  Component Value Date   HGBA1C 5.2 11/14/2020   Lab Results  Component Value Date   VITAMINB12 283 08/05/2023   Lab Results  Component Value Date   TSH 2.602 11/13/2020      ASSESSMENT AND PLAN 63 y.o. year old female  has a past medical history of Allergy, Anxiety, Arthritis, Back pain, Chronic rhinitis, Clotting disorder, Complication of anesthesia, Depression, Dysrhythmia, Family history of colonic polyps, Hepatic hemangioma, Hyperlipidemia, IBS (irritable bowel syndrome), Insomnia, Migraine, Osteoarthritis, foot, localized, Perimenopause, PONV (postoperative nausea and vomiting), Raynauds syndrome, Seasonal allergies, Sleep apnea, and Von Willebrand's disease (HCC). here with:  1.  Migraine headaches 2.  Hot flashes  Patient will continue naratriptan  for abortive therapy for now. Continue gabapentin  300 mg  3 times a day   Follow-up in 1 year or sooner if needed    Duwaine Russell, MSN, NP-C 09/28/2024, 10:48 AM [facility] [address], [address] [place], [place] [address] [phone]

## 2024-09-28 NOTE — Progress Notes (Signed)
 PATIENT: [name] DOB: Jan 28, 1961  REASON FOR VISIT: follow up HISTORY FROM: patient  Virtual Visit via Video Note  I connected with [name] on 09/28/2024 at 10:30 AM EST by a video enabled telemedicine application located remotely at [facility] and verified that I am speaking with the correct person using two identifiers who was located at their own home   I discussed the limitations of evaluation and management by telemedicine and the availability of in person appointments. The patient expressed understanding and agreed to proceed.   PATIENT: [name] DOB: December 26, 1960  REASON FOR VISIT: follow up HISTORY FROM: patient  HISTORY OF PRESENT ILLNESS: Today 09/28/2024:  [name] is a 63 y.o. female with a history of migraine headaches and obstructive sleep apnea. Returns today for follow-up.  Overall she reports that she has been doing well.  She does state that she had appeared in the ball that she was having frequent migraines but that has resolved.  She uses naratriptan  for abortive therapy and that tends to work well for her.  Remains on gabapentin  300 mg 2-3 times a day for hot flashes.  She reports that it has been beneficial.  She does have auras with her migraines.  She is aware of the risk of stroke in combination with estrogen and history of migraine with aura.  She returns today for an evaluation.   HISTORY [name] is a 63 y.o. female with a history of migraine headaches and obstructive sleep apnea. Returns today for follow-up.   Migraines: Patient states that her migraines are manageable.  She never started Qulipta  as her headaches decreased.  She continues to use gabapentin  300 mg twice a day but lately she has been taking it TID due to hot flashes. She inially started on gabapentin  for hot flashes and it helps. Continues to take naratriptan  for abortive therapy and this works well for her.  OSA: has  not restarted. Unable to tolerate it.      12/18/22: [name] is a 63 y.o. female with a history of Migraine headaches and OSA. Returns today for follow-up.   Struggle with using the CPAP. Doesn't sleep well with it. Wakes her up at night.  Continues to use trazodone .  She is going to try restarting the CPAP.  Migraines: has atleast 3-4 migraines a month. Phonophobia. Migraine usually starts with visual changes. She had 2 episodes where she temporaily got lost. Naratriptan  works well- resolves in 30 minutes. No longer doing Emgality  due to site reaction. Continues to take gabapentin  300 mg twice a day.   Has been out of work due to foot surgery. Will start back at work next week.   04/04/2021: Patient is here for follow-up of transient global amnesia and new onset of headaches.  Patient was started on gabapentin  which she feels was extremely helpful for her headaches however it is making her gain weight, and also help with her hot flashes.  She is also on [name] . She thinks the gabapentin  has helped her headaches. She desn't think it was the [name] . She was taking it 300mg  bid and 600mg  qhs. Now she is taking it more at night and back to taking 300mg  at bedtime.  She was doing extremely well with her headaches however over the last week there has been some stress and she has recurrent headaches.  I encouraged her to continue the Emgality  and we can follow-up again in 3 months.  She can continue the gabapentin  at a lower dose.   Patient had a very strange episode where she ordered from [employer] at 315 and 415 in the morning and doesn't remember doing it. She does take ambien  but this has never happened to her.  We discussed could be a medication effect, I find it highly unlikely that she could sign onto [employer] and in order items if she were having a seizure even if it were complex partial, but cannot rule it out.   - we will continue the ajovy , take benadryl  prior for any reactions.  If insurance  prefers Emgality  we can order that instead.   - she is taking 300mg  qam and 300qpm helping with the hot flashes but she is gaining weight so she is going to see obgyn to help with hot flashes. She thinks gabapentin  helped her headaches unfortunately.    - she wake often in the middle of the night, she is extremely fatigued during the day, we could send to dr dohmeier and during the sleep study monitor eeg       01/07/2021: She has had a headache since she left. It is not getting better. She is having the migraines daily, the brain feels bruised, in the left eye, it may seem like it goes away but it comes back with any stress, light makes it worse.  She had the abscess found in the left side of her jaw, it is now improved but she continues to have headaches.  She did have a migraine cocktail in our office and that helped significantly but again can still continues to have headaches.  She tried the Axert , she tried the Maxalt , she tried and Nurtec, and still having headaches.  We discussed at this time it would probably be best for her to start a preventative, I gave her [name]  samples, we injected the first 1 in the office, we will check back in with her in 10 weeks and see how she is feeling.   12/12/20: [name] is a 63 year old female with a history of altered awareness, migraine headache. She returns today still with a daily headache.  In the past Dr. Darleen has tried a prednisone  Dosepak, indomethacin , axert , nurtec and migraine infusion but this has not broken her headache cycle.  She does state that with the migraine infusion she did go a day and a half without headache.  In the last 2 weeks she has been diagnosed with an abscessed tooth.  She was originally placed on penicillin and had a tooth removed today.  She describes her headache today as a dull headache 2 out of 10 on the pain scale.  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  HISTORY  (copied from Dr. Sharion note)  [name] is a 63 y.o. female here as requested by  Ivonne Mustache MD for migraines. PMHx von Willebrand's disease, ray nodes, peripheral neuropathy, migraine, insomnia, IBS, hyperlipidemia, depression, back pain, arthritis, anxiety.  I reviewed Ivonne Mustache MDs discharge summary from November 15, 2020: Patient presented to the emergency room secondary to transient left-sided weakness, dizziness and visual defects, by the time physician saw her her symptoms were completely resolved and she felt back to normal, symptoms were present for several hours, she was last seen well when waking up in the morning, she expressed having episodes after showering when her leg gave way, while driving to work she experienced diplopia and triple vision,  Rx to experienced a period of time according to her 20 minutes or so of transient amnesia, CT scan of the head was without acute abnormality, negative MRI, normal blood work, TIA/stroke work-up was negative, her symptoms were thought to be secondary to status migrainosus, she was given multiple doses of migraine cocktail during hospitalization, she was discharged on Maxalt  and valproic acid.   She feels spacey, she still has a headache, has had it since she was seen at the ER, she was driving and she says she had triple vision and closed one eye and it resolved, she has had a headache for 2 months and she has had a headache since her friend passed away thanksgiving, she cared for her friend's mother who dies in hospice (she gave her morphine throught the night) she didn't sleep for several days helping out and a few days later had the incident driving and triple vision, she had a headache at the time (had been ongoing for months in the setting of stress), she wakes up with migraine, better standing, waxes and wanes, prior to a few months ago she would get a headache once a month and caffeine  and analgesic may help, but she would start  with a visual problem, looks like a 70s wavy lines in both eyes, may not progress to a headache if she catches it on time. But this has been different. She lost 3 hours, she was at work, she was told she was appropriate ut doesn't remember 3 hours. Not doing well on Depakote . She is taking the maxalt  4-5 times worried the headache will come back. Migraine cocktail inpatient helped, she slept the first night int he hospital, she had severe light sensitivity, she had benadryl , compazine , decadron  she felt better prior to going home but the headache has returned but not as severe since she came home. maxalt  is helping.    Reviewed notes, labs and imaging from outside physicians, which showed:   From a thorough review of records, medications tried that can be used in migraine management includes Tylenol , aspirin , Fioricet , Flexeril , Decadron  injections, Benadryl  injections, Depakote , ibuprofen, ketorolac  injections, magnesium, Depo-Medrol  injection, Reglan  injections, metoprolol , Zofran  tablets and injections, Compazine  injections, rizatriptan , Effexor .     B12 373, TSH 2.6, hemoglobin A1c 5.2, LDL 134, CBC normal, BMP unremarkable, HIV negative.     MRI brain 11/13/2020: FINDINGS: Brain: Ventricle size and cerebral volume within normal limits. Several small hyperintensities in the left frontal lobe. Brainstem and cerebellum normal.   Negative for acute infarct.  Negative for hemorrhage or mass.   Vascular: Normal arterial flow voids.   Skull and upper cervical spine: Negative   Sinuses/Orbits: Paranasal sinuses clear negative orbit   Other: None   IMPRESSION: No acute abnormality. Mild white matter changes most likely due to chronic ischemia.   CTA H&N: IMPRESSION: 11/13/2020 No large vessel occlusion or hemodynamically significant proximal stenosis in the head or neck.  REVIEW OF SYSTEMS: Out of a complete 14 system review of symptoms, the patient complains only of the following symptoms,  and all other reviewed systems are negative.  ALLERGIES: Allergies[1]  HOME MEDICATIONS: Outpatient Medications Prior to Visit  Medication Sig Dispense Refill   buPROPion  (WELLBUTRIN  XL) 150 MG 24 hr tablet Take 1 tablet (150 mg total) by mouth every morning. 90 tablet 0   cholecalciferol  (VITAMIN D3) 25 MCG (1000 UNIT) tablet Take 1,000 Units by mouth daily.     Coenzyme Q10 (CO Q-10) 50 MG CAPS Take by mouth.  conjugated estrogens  (PREMARIN ) vaginal cream Insert 0.5 g vaginally nightly for 2 weeks then use 2-3 times a week 30 g 1   dicyclomine  (BENTYL ) 10 MG capsule Take 1 capsule (10 mg total) by mouth 3 (three) times daily as needed for spasms. 90 capsule 0   Estradiol  (YUVAFEM ) 10 MCG TABS vaginal tablet Insert 1 tablet 3 times a week by vaginal route. 18 tablet 3   fluconazole  (DIFLUCAN ) 150 MG tablet Take 1 tablet (150 mg total) by mouth as needed for candidiasis flareup. 15 tablet 1   Multiple Vitamins-Minerals (MULTIPLE VITAMINS/WOMENS) tablet Take 1 tablet by mouth daily. (Patient not taking: Reported on 07/27/2024)     nitrofurantoin , macrocrystal-monohydrate, (MACROBID ) 100 MG capsule Take 1 capsule (100 mg total) by mouth 2 (two) times daily. 10 capsule 0   nitrofurantoin , macrocrystal-monohydrate, (MACROBID ) 100 MG capsule Take 1 capsule (100 mg total) by mouth daily as needed. 30 capsule 2   nystatin  cream (MYCOSTATIN ) Apply 1 Application topically 2 (two) times daily. 60 g 1   ondansetron  (ZOFRAN ) 4 MG tablet Take 1 tablet (4 mg total) by mouth every 6 (six) hours as needed for nausea and vomiting. 4 tablet 0   phenazopyridine  (PYRIDIUM ) 100 MG tablet Take 1 tablet (100 mg total) by mouth 3 (three) times daily as needed for pain. 10 tablet 0   Probiotic Product (UP4 PROBIOTICS PO) Take by mouth.     traZODone  (DESYREL ) 50 MG tablet Take 1-2 tablets (50-100 mg total) by mouth at bedtime. 60 tablet 5   traZODone  (DESYREL ) 50 MG tablet Take 2 tablets (100 mg total) by mouth at  bedtime. 180 tablet 0   zolpidem  (AMBIEN ) 5 MG tablet Take 1 tablet (5 mg total) by mouth at bedtime as needed for sleep. 30 tablet 0   gabapentin  (NEURONTIN ) 300 MG capsule Take 1 capsule (300 mg total) by mouth 3 (three) times daily. 90 capsule 11   naratriptan  (AMERGE) 2.5 MG tablet Take one tablet by mouth at onset of headache; if returns or does not resolve, may repeat after 2-4 hours; do not exceed two tabs in 24 hours. 9 tablet 3   No facility-administered medications prior to visit.    PAST MEDICAL HISTORY: Past Medical History:  Diagnosis Date   Allergy    Anxiety    Arthritis    Back pain    Chronic rhinitis    Clotting disorder    Complication of anesthesia    Depression    Dysrhythmia    hx svt- pcp follows   Family history of colonic polyps    Hepatic hemangioma    Hyperlipidemia    IBS (irritable bowel syndrome)    Insomnia    Migraine    Osteoarthritis, foot, localized    left   Perimenopause    PONV (postoperative nausea and vomiting)    Raynauds syndrome    Seasonal allergies    Sleep apnea    Von Willebrand's disease (HCC)    bruises easily    PAST SURGICAL HISTORY: Past Surgical History:  Procedure Laterality Date   ARTHROTOMY Left 09/18/2022   Procedure: Fourth and Fifth Tarsal Metatarsal Phalangeal Arthrotomy and Debridement; Protein Rich Plasma injection to 4-5 Tarsalmetarsal joint;  Surgeon: Kit Rush, MD;  Location: [facility];  Service: Orthopedics;  Laterality: Left;   BONE EXOSTOSIS EXCISION Left 02/24/2019   Procedure: Dorsal Exostectomy;  Surgeon: Kit Rush, MD;  Location: [facility];  Service: Orthopedics;  Laterality: Left;   CESAREAN SECTION  COLONOSCOPY     FOOT ARTHRODESIS Left 09/18/2022   Procedure: Arthrodesis Tarsal Metatarsal Joint 1-3;  Surgeon: Kit Rush, MD;  Location: [facility];  Service: Orthopedics;  Laterality: Left;   GASTROCNEMIUS RECESSION Left 09/18/2022    Procedure: Gastrocnemius Recession;  Surgeon: Kit Rush, MD;  Location: [facility];  Service: Orthopedics;  Laterality: Left;   laparoscopy removal of right ovarian cyst     left knee arthroscopy     NOVASURE ABLATION  12/2005   pylonidal cyst removal     SEPTOPLASTY     TOOTH EXTRACTION  12/2020    FAMILY HISTORY: Family History  Problem Relation Age of Onset   Hypertension Mother    Arthritis Mother    Hypertension Father    Heart disease Father    Prostate cancer Father    Colon polyps Father    Lupus Sister    Ulcerative colitis Sister    Other Sister        mythensia gravis   Stomach cancer Maternal Grandmother    Colon cancer Neg Hx    Rectal cancer Neg Hx    Esophageal cancer Neg Hx    Liver cancer Neg Hx    Migraines Neg Hx     SOCIAL HISTORY: Social History   Socioeconomic History   Marital status: Married    Spouse name: Not on file   Number of children: 2   Years of education: Not on file   Highest education level: Not on file  Occupational History   Occupation: CRNA  Tobacco Use   Smoking status: Former    Types: Cigarettes   Smokeless tobacco: Never  Vaping Use   Vaping status: Never Used  Substance and Sexual Activity   Alcohol use: Yes    Alcohol/week: 10.0 - 15.0 standard drinks of alcohol    Types: 10 - 15 Glasses of wine per week    Comment: wine or vodka; was previously 1-2 per night, but liver enzymes up so she has cut back   Drug use: No   Sexual activity: Yes    Partners: Male    Comment: husband vasectomy-1st intercourse 21 yo-5 partners  Other Topics Concern   Not on file  Social History Narrative   Lives at home with husband and 2 children who are freshman in college   Right handed   Caffeine : maybe 1 cup/day   Social Drivers of Health   Tobacco Use: Low Risk (08/31/2024)   Received from [facility]   Patient History    Smoking Tobacco Use: Never    Smokeless Tobacco Use: Never    Passive Exposure:  Never  Recent Concern: Tobacco Use - Medium Risk (08/24/2024)   Patient History    Smoking Tobacco Use: Former    Smokeless Tobacco Use: Never    Passive Exposure: Not on Actuary Strain: Not on file  Food Insecurity: Low Risk (08/31/2024)   Received from [facility]   Epic    Within the past 12 months, you worried that your food would run out before you got money to buy more: Never true    Within the past 12 months, the food you bought just didn't last and you didn't have money to get more. : Never true  Transportation Needs: No Transportation Needs (08/31/2024)   Received from [facility]    In the past 12 months, has lack of reliable transportation kept you from medical appointments, meetings, work  or from getting things needed for daily living? : No  Physical Activity: Not on file  Stress: Not on file  Social Connections: Not on file  Intimate Partner Violence: Not on file  Depression (EYV7-0): Low Risk (08/05/2023)   Depression (PHQ2-9)    PHQ-2 Score: 0  Alcohol Screen: Not on file  Housing: Low Risk (08/31/2024)   Received from [facility]   Epic    What is your living situation today?: I have a steady place to live    Think about the place you live. Do you have problems with any of the following? Choose all that apply:: None/None on this list  Utilities: Low Risk (08/31/2024)   Received from [facility]   Utilities    In the past 12 months has the electric, gas, oil, or water company threatened to shut off services in your home? : No  Health Literacy: Not on file      PHYSICAL EXAM Generalized: Well developed, in no acute distress   Neurological examination  Mentation: Alert oriented to time, place, history taking. Follows all commands speech and language fluent Cranial nerve II-XII: Facial symmetry noted.   DIAGNOSTIC DATA (LABS, IMAGING, TESTING) - I reviewed patient records, labs, notes, testing and imaging myself where  available.  Lab Results  Component Value Date   WBC 5.9 11/13/2020   HGB 13.1 11/13/2020   HCT 40.3 11/13/2020   MCV 96.0 11/13/2020   PLT 182 11/13/2020      Component Value Date/Time   NA 137 11/13/2020 0811   NA 138 12/09/2019 1052   K 4.1 11/13/2020 0811   CL 102 11/13/2020 0811   CO2 27 11/13/2020 0811   GLUCOSE 105 (H) 11/13/2020 0811   BUN 15 11/13/2020 0811   BUN 15 12/09/2019 1052   CREATININE 0.68 11/13/2020 0811   CREATININE 0.82 08/22/2014 1130   CALCIUM 8.9 11/13/2020 0811   PROT 6.4 08/22/2014 1130   ALBUMIN 4.1 08/22/2014 1130   AST 20 08/22/2014 1130   ALT 23 08/22/2014 1130   ALKPHOS 57 08/22/2014 1130   BILITOT 0.3 08/22/2014 1130   GFRNONAA >60 11/13/2020 0811   GFRAA 79 12/09/2019 1052   Lab Results  Component Value Date   CHOL 215 (H) 11/14/2020   HDL 58 11/14/2020   LDLCALC 134 (H) 11/14/2020   TRIG 116 11/14/2020   CHOLHDL 3.7 11/14/2020   Lab Results  Component Value Date   HGBA1C 5.2 11/14/2020   Lab Results  Component Value Date   VITAMINB12 283 08/05/2023   Lab Results  Component Value Date   TSH 2.602 11/13/2020      ASSESSMENT AND PLAN 63 y.o. year old female  has a past medical history of Allergy, Anxiety, Arthritis, Back pain, Chronic rhinitis, Clotting disorder, Complication of anesthesia, Depression, Dysrhythmia, Family history of colonic polyps, Hepatic hemangioma, Hyperlipidemia, IBS (irritable bowel syndrome), Insomnia, Migraine, Osteoarthritis, foot, localized, Perimenopause, PONV (postoperative nausea and vomiting), Raynauds syndrome, Seasonal allergies, Sleep apnea, and Von Willebrand's disease (HCC). here with:  Migraine with aura Hot flashes  - Continue naratriptan  for abortive therapy - Continue gabapentin  200 mg 3 times a day for hot flashes - Advised that if her headache frequency or severity increases we can consider preventative medication - Follow-up in 1 year or sooner if needed  * No order type  specified * Meds ordered this encounter  Medications   gabapentin  (NEURONTIN ) 300 MG capsule    Sig: Take 1 capsule (300 mg total) by mouth  3 (three) times daily.    Dispense:  90 capsule    Refill:  11    Supervising Provider:   YAN, YIJUN [[id number]]   naratriptan  (AMERGE) 2.5 MG tablet    Sig: Take one tablet by mouth at onset of headache; if returns or does not resolve, may repeat after 2-4 hours; do not exceed two tabs in 24 hours.    Dispense:  9 tablet    Refill:  3    Supervising Provider:   YAN, YIJUN [[id number]]     Duwaine Russell, MSN, NP-C 09/28/2024, 3:27 PM [facility] [address] [place], [place] [address] [phone]     [1]  Allergies Allergen Reactions   Ciprofloxacin  Other (See Comments)    Tendonitis    Levaquin [Levofloxacin In D5w] Other (See Comments)    Tendonitis in shoulder, severe yeast infections   Sulfa Antibiotics Other (See Comments)    Unknown reaction - childhood allergy

## 2024-09-29 ENCOUNTER — Other Ambulatory Visit (HOSPITAL_COMMUNITY): Payer: Self-pay

## 2024-09-30 ENCOUNTER — Other Ambulatory Visit (HOSPITAL_COMMUNITY): Payer: Self-pay

## 2024-09-30 ENCOUNTER — Other Ambulatory Visit: Payer: Self-pay

## 2024-10-10 ENCOUNTER — Other Ambulatory Visit (HOSPITAL_BASED_OUTPATIENT_CLINIC_OR_DEPARTMENT_OTHER): Payer: Self-pay

## 2024-10-10 MED ORDER — PREVNAR 20 0.5 ML IM SUSY
0.5000 mL | PREFILLED_SYRINGE | Freq: Once | INTRAMUSCULAR | 0 refills | Status: AC
  Filled 2024-10-10: qty 0.5, 1d supply, fill #0

## 2024-10-10 MED ORDER — ZOSTER VAC RECOMB ADJUVANTED 50 MCG/0.5ML IM SUSR
0.5000 mL | Freq: Once | INTRAMUSCULAR | 0 refills | Status: AC
  Filled 2024-10-10: qty 0.5, 1d supply, fill #0

## 2025-09-28 ENCOUNTER — Telehealth: Admitting: Adult Health
